# Patient Record
Sex: Male | Born: 1935 | Race: Black or African American | Hispanic: No | Marital: Married | Smoking: Former smoker
Health system: Southern US, Community
[De-identification: ages and names within clinical notes are randomized; demographics above are authoritative.]

## PROBLEM LIST (undated history)

## (undated) DIAGNOSIS — Z5189 Encounter for other specified aftercare: Secondary | ICD-10-CM

## (undated) DIAGNOSIS — M1990 Unspecified osteoarthritis, unspecified site: Secondary | ICD-10-CM

## (undated) DIAGNOSIS — I469 Cardiac arrest, cause unspecified: Secondary | ICD-10-CM

## (undated) DIAGNOSIS — I10 Essential (primary) hypertension: Secondary | ICD-10-CM

## (undated) DIAGNOSIS — K922 Gastrointestinal hemorrhage, unspecified: Secondary | ICD-10-CM

## (undated) DIAGNOSIS — I2510 Atherosclerotic heart disease of native coronary artery without angina pectoris: Secondary | ICD-10-CM

## (undated) DIAGNOSIS — I509 Heart failure, unspecified: Secondary | ICD-10-CM

## (undated) DIAGNOSIS — I472 Ventricular tachycardia: Secondary | ICD-10-CM

## (undated) DIAGNOSIS — E7800 Pure hypercholesterolemia, unspecified: Secondary | ICD-10-CM

## (undated) DIAGNOSIS — R0902 Hypoxemia: Secondary | ICD-10-CM

## (undated) DIAGNOSIS — H269 Unspecified cataract: Secondary | ICD-10-CM

## (undated) DIAGNOSIS — H409 Unspecified glaucoma: Secondary | ICD-10-CM

## (undated) DIAGNOSIS — I219 Acute myocardial infarction, unspecified: Secondary | ICD-10-CM

## (undated) DIAGNOSIS — R011 Cardiac murmur, unspecified: Secondary | ICD-10-CM

## (undated) HISTORY — DX: Heart failure, unspecified: I50.9

## (undated) HISTORY — DX: Encounter for other specified aftercare: Z51.89

## (undated) HISTORY — DX: Hypoxemia: R09.02

## (undated) HISTORY — PX: CARDIAC CATHETERIZATION: SHX172

## (undated) HISTORY — DX: Unspecified osteoarthritis, unspecified site: M19.90

## (undated) HISTORY — DX: Unspecified cataract: H26.9

## (undated) HISTORY — DX: Unspecified glaucoma: H40.9

## (undated) HISTORY — DX: Cardiac murmur, unspecified: R01.1

---

## 2014-08-23 LAB — HM COLONOSCOPY: HM COLON: NORMAL

## 2015-12-13 ENCOUNTER — Inpatient Hospital Stay (HOSPITAL_COMMUNITY)
Admission: EM | Admit: 2015-12-13 | Discharge: 2015-12-20 | DRG: 222 | Disposition: A | Discharge status: Home Health | Payer: Medicare Other | Attending: Internal Medicine | Admitting: Internal Medicine

## 2015-12-13 ENCOUNTER — Emergency Department (HOSPITAL_COMMUNITY): Payer: Medicare Other

## 2015-12-13 ENCOUNTER — Encounter (HOSPITAL_COMMUNITY): Payer: Self-pay | Admitting: *Deleted

## 2015-12-13 DIAGNOSIS — Z951 Presence of aortocoronary bypass graft: Secondary | ICD-10-CM

## 2015-12-13 DIAGNOSIS — I110 Hypertensive heart disease with heart failure: Principal | ICD-10-CM | POA: Diagnosis present

## 2015-12-13 DIAGNOSIS — I080 Rheumatic disorders of both mitral and aortic valves: Secondary | ICD-10-CM | POA: Diagnosis present

## 2015-12-13 DIAGNOSIS — K922 Gastrointestinal hemorrhage, unspecified: Secondary | ICD-10-CM | POA: Diagnosis present

## 2015-12-13 DIAGNOSIS — I25718 Atherosclerosis of autologous vein coronary artery bypass graft(s) with other forms of angina pectoris: Secondary | ICD-10-CM | POA: Diagnosis not present

## 2015-12-13 DIAGNOSIS — Z79899 Other long term (current) drug therapy: Secondary | ICD-10-CM

## 2015-12-13 DIAGNOSIS — Z87891 Personal history of nicotine dependence: Secondary | ICD-10-CM | POA: Diagnosis not present

## 2015-12-13 DIAGNOSIS — R0902 Hypoxemia: Secondary | ICD-10-CM | POA: Diagnosis present

## 2015-12-13 DIAGNOSIS — R7989 Other specified abnormal findings of blood chemistry: Secondary | ICD-10-CM | POA: Diagnosis present

## 2015-12-13 DIAGNOSIS — I10 Essential (primary) hypertension: Secondary | ICD-10-CM | POA: Diagnosis not present

## 2015-12-13 DIAGNOSIS — I252 Old myocardial infarction: Secondary | ICD-10-CM | POA: Diagnosis not present

## 2015-12-13 DIAGNOSIS — I5041 Acute combined systolic (congestive) and diastolic (congestive) heart failure: Secondary | ICD-10-CM | POA: Diagnosis not present

## 2015-12-13 DIAGNOSIS — I472 Ventricular tachycardia, unspecified: Secondary | ICD-10-CM

## 2015-12-13 DIAGNOSIS — I2582 Chronic total occlusion of coronary artery: Secondary | ICD-10-CM | POA: Diagnosis present

## 2015-12-13 DIAGNOSIS — Z7982 Long term (current) use of aspirin: Secondary | ICD-10-CM

## 2015-12-13 DIAGNOSIS — E440 Moderate protein-calorie malnutrition: Secondary | ICD-10-CM | POA: Diagnosis present

## 2015-12-13 DIAGNOSIS — E785 Hyperlipidemia, unspecified: Secondary | ICD-10-CM | POA: Diagnosis present

## 2015-12-13 DIAGNOSIS — I509 Heart failure, unspecified: Secondary | ICD-10-CM | POA: Diagnosis present

## 2015-12-13 DIAGNOSIS — I5023 Acute on chronic systolic (congestive) heart failure: Secondary | ICD-10-CM | POA: Diagnosis not present

## 2015-12-13 DIAGNOSIS — I5033 Acute on chronic diastolic (congestive) heart failure: Secondary | ICD-10-CM | POA: Diagnosis not present

## 2015-12-13 DIAGNOSIS — I5043 Acute on chronic combined systolic (congestive) and diastolic (congestive) heart failure: Secondary | ICD-10-CM | POA: Diagnosis present

## 2015-12-13 DIAGNOSIS — R079 Chest pain, unspecified: Secondary | ICD-10-CM | POA: Insufficient documentation

## 2015-12-13 DIAGNOSIS — E7800 Pure hypercholesterolemia, unspecified: Secondary | ICD-10-CM | POA: Diagnosis present

## 2015-12-13 DIAGNOSIS — Z7902 Long term (current) use of antithrombotics/antiplatelets: Secondary | ICD-10-CM

## 2015-12-13 DIAGNOSIS — I25119 Atherosclerotic heart disease of native coronary artery with unspecified angina pectoris: Secondary | ICD-10-CM | POA: Diagnosis present

## 2015-12-13 DIAGNOSIS — Z8249 Family history of ischemic heart disease and other diseases of the circulatory system: Secondary | ICD-10-CM | POA: Diagnosis not present

## 2015-12-13 DIAGNOSIS — I2510 Atherosclerotic heart disease of native coronary artery without angina pectoris: Secondary | ICD-10-CM | POA: Diagnosis not present

## 2015-12-13 DIAGNOSIS — I350 Nonrheumatic aortic (valve) stenosis: Secondary | ICD-10-CM

## 2015-12-13 DIAGNOSIS — Z95818 Presence of other cardiac implants and grafts: Secondary | ICD-10-CM

## 2015-12-13 DIAGNOSIS — I25118 Atherosclerotic heart disease of native coronary artery with other forms of angina pectoris: Secondary | ICD-10-CM | POA: Diagnosis not present

## 2015-12-13 DIAGNOSIS — I255 Ischemic cardiomyopathy: Secondary | ICD-10-CM | POA: Diagnosis present

## 2015-12-13 DIAGNOSIS — I469 Cardiac arrest, cause unspecified: Secondary | ICD-10-CM | POA: Diagnosis not present

## 2015-12-13 DIAGNOSIS — I25810 Atherosclerosis of coronary artery bypass graft(s) without angina pectoris: Secondary | ICD-10-CM | POA: Diagnosis present

## 2015-12-13 DIAGNOSIS — R778 Other specified abnormalities of plasma proteins: Secondary | ICD-10-CM

## 2015-12-13 DIAGNOSIS — I25710 Atherosclerosis of autologous vein coronary artery bypass graft(s) with unstable angina pectoris: Secondary | ICD-10-CM | POA: Diagnosis not present

## 2015-12-13 HISTORY — DX: Pure hypercholesterolemia, unspecified: E78.00

## 2015-12-13 HISTORY — DX: Atherosclerotic heart disease of native coronary artery without angina pectoris: I25.10

## 2015-12-13 HISTORY — DX: Acute myocardial infarction, unspecified: I21.9

## 2015-12-13 HISTORY — DX: Gastrointestinal hemorrhage, unspecified: K92.2

## 2015-12-13 HISTORY — DX: Cardiac arrest, cause unspecified: I46.9

## 2015-12-13 HISTORY — DX: Essential (primary) hypertension: I10

## 2015-12-13 HISTORY — DX: Ventricular tachycardia: I47.2

## 2015-12-13 LAB — PROTIME-INR
INR: 1.16 (ref 0.00–1.49)
Prothrombin Time: 14.9 seconds (ref 11.6–15.2)

## 2015-12-13 LAB — COMPREHENSIVE METABOLIC PANEL
ALT: 22 U/L (ref 17–63)
AST: 19 U/L (ref 15–41)
Albumin: 2.9 g/dL — ABNORMAL LOW (ref 3.5–5.0)
Alkaline Phosphatase: 54 U/L (ref 38–126)
Anion gap: 12 (ref 5–15)
BUN: 11 mg/dL (ref 6–20)
CHLORIDE: 107 mmol/L (ref 101–111)
CO2: 22 mmol/L (ref 22–32)
CREATININE: 1.16 mg/dL (ref 0.61–1.24)
Calcium: 9.1 mg/dL (ref 8.9–10.3)
GFR, EST NON AFRICAN AMERICAN: 59 mL/min — AB (ref >60)
Glucose, Bld: 104 mg/dL — ABNORMAL HIGH (ref 65–99)
Potassium: 3.9 mmol/L (ref 3.5–5.1)
Sodium: 141 mmol/L (ref 135–145)
Total Bilirubin: 0.9 mg/dL (ref 0.3–1.2)
Total Protein: 6.5 g/dL (ref 6.5–8.1)

## 2015-12-13 LAB — CBC WITH DIFFERENTIAL/PLATELET
Basophils Absolute: 0 10*3/uL (ref 0.0–0.1)
Basophils Relative: 0 %
EOS PCT: 1 %
Eosinophils Absolute: 0.1 10*3/uL (ref 0.0–0.7)
HCT: 36.5 % — ABNORMAL LOW (ref 39.0–52.0)
Hemoglobin: 11.7 g/dL — ABNORMAL LOW (ref 13.0–17.0)
LYMPHS ABS: 1.5 10*3/uL (ref 0.7–4.0)
LYMPHS PCT: 13 %
MCH: 27.6 pg (ref 26.0–34.0)
MCHC: 32.1 g/dL (ref 30.0–36.0)
MCV: 86.1 fL (ref 78.0–100.0)
MONO ABS: 0.9 10*3/uL (ref 0.1–1.0)
MONOS PCT: 8 %
Neutro Abs: 8.8 10*3/uL — ABNORMAL HIGH (ref 1.7–7.7)
Neutrophils Relative %: 78 %
PLATELETS: 279 10*3/uL (ref 150–400)
RBC: 4.24 MIL/uL (ref 4.22–5.81)
RDW: 14.6 % (ref 11.5–15.5)
WBC: 11.3 10*3/uL — ABNORMAL HIGH (ref 4.0–10.5)

## 2015-12-13 LAB — TROPONIN I
TROPONIN I: 0.1 ng/mL — AB (ref <0.031)
Troponin I: 0.11 ng/mL — ABNORMAL HIGH (ref <0.031)

## 2015-12-13 LAB — APTT: aPTT: 67 seconds — ABNORMAL HIGH (ref 24–37)

## 2015-12-13 LAB — I-STAT CG4 LACTIC ACID, ED: LACTIC ACID, VENOUS: 1.5 mmol/L (ref 0.5–2.0)

## 2015-12-13 LAB — BRAIN NATRIURETIC PEPTIDE: B Natriuretic Peptide: 1431.1 pg/mL — ABNORMAL HIGH (ref 0.0–100.0)

## 2015-12-13 MED ORDER — ACETAMINOPHEN 325 MG PO TABS
650.0000 mg | ORAL_TABLET | ORAL | Status: DC | PRN
  Administered 2015-12-16: 650 mg via ORAL
  Filled 2015-12-13: qty 2

## 2015-12-13 MED ORDER — CARVEDILOL 3.125 MG PO TABS
3.1250 mg | ORAL_TABLET | Freq: Two times a day (BID) | ORAL | Status: DC
  Administered 2015-12-14 – 2015-12-19 (×10): 3.125 mg via ORAL
  Filled 2015-12-13 (×10): qty 1

## 2015-12-13 MED ORDER — ONDANSETRON HCL 4 MG/2ML IJ SOLN: 4.0000 mg | Freq: Four times a day (QID) | INTRAMUSCULAR | Status: DC | PRN

## 2015-12-13 MED ORDER — HEPARIN (PORCINE) IN NACL 100-0.45 UNIT/ML-% IJ SOLN
1150.0000 [IU]/h | INTRAMUSCULAR | Status: DC
  Administered 2015-12-13: 900 [IU]/h via INTRAVENOUS
  Filled 2015-12-13: qty 250

## 2015-12-13 MED ORDER — FUROSEMIDE 10 MG/ML IJ SOLN
40.0000 mg | Freq: Two times a day (BID) | INTRAMUSCULAR | Status: DC
  Administered 2015-12-14 – 2015-12-15 (×3): 40 mg via INTRAVENOUS
  Filled 2015-12-13 (×3): qty 4

## 2015-12-13 MED ORDER — CLOPIDOGREL BISULFATE 75 MG PO TABS
75.0000 mg | ORAL_TABLET | Freq: Every day | ORAL | Status: DC
  Administered 2015-12-14 – 2015-12-20 (×7): 75 mg via ORAL
  Filled 2015-12-13 (×8): qty 1

## 2015-12-13 MED ORDER — VITAMIN C 500 MG PO TABS
500.0000 mg | ORAL_TABLET | Freq: Two times a day (BID) | ORAL | Status: DC
  Administered 2015-12-13 – 2015-12-20 (×14): 500 mg via ORAL
  Filled 2015-12-13 (×16): qty 1

## 2015-12-13 MED ORDER — FUROSEMIDE 10 MG/ML IJ SOLN
40.0000 mg | Freq: Once | INTRAMUSCULAR | Status: AC
  Administered 2015-12-13: 40 mg via INTRAVENOUS
  Filled 2015-12-13: qty 4

## 2015-12-13 MED ORDER — ZOLPIDEM TARTRATE 5 MG PO TABS
5.0000 mg | ORAL_TABLET | Freq: Every evening | ORAL | Status: DC | PRN
  Administered 2015-12-14 – 2015-12-17 (×5): 5 mg via ORAL
  Filled 2015-12-13 (×5): qty 1

## 2015-12-13 MED ORDER — FERROUS SULFATE 325 (65 FE) MG PO TABS
325.0000 mg | ORAL_TABLET | Freq: Three times a day (TID) | ORAL | Status: DC
  Administered 2015-12-14 – 2015-12-20 (×19): 325 mg via ORAL
  Filled 2015-12-13 (×20): qty 1

## 2015-12-13 MED ORDER — LINACLOTIDE 145 MCG PO CAPS
145.0000 ug | ORAL_CAPSULE | Freq: Every day | ORAL | Status: DC | PRN
  Administered 2015-12-15: 145 ug via ORAL
  Filled 2015-12-13 (×2): qty 1

## 2015-12-13 MED ORDER — TIMOLOL MALEATE 0.5 % OP SOLN
1.0000 [drp] | Freq: Two times a day (BID) | OPHTHALMIC | Status: DC
  Administered 2015-12-14 – 2015-12-20 (×13): 1 [drp] via OPHTHALMIC
  Filled 2015-12-13 (×2): qty 5

## 2015-12-13 MED ORDER — NITROGLYCERIN 0.4 MG SL SUBL
0.4000 mg | SUBLINGUAL_TABLET | SUBLINGUAL | Status: DC | PRN
Start: 2015-12-13 — End: 2015-12-20

## 2015-12-13 MED ORDER — SODIUM CHLORIDE 0.9% FLUSH
3.0000 mL | Freq: Two times a day (BID) | INTRAVENOUS | Status: DC
  Administered 2015-12-13: 3 mL via INTRAVENOUS

## 2015-12-13 MED ORDER — ASPIRIN 81 MG PO CHEW
324.0000 mg | CHEWABLE_TABLET | Freq: Once | ORAL | Status: DC
  Filled 2015-12-13: qty 4

## 2015-12-13 MED ORDER — HEPARIN BOLUS VIA INFUSION
3000.0000 [IU] | Freq: Once | INTRAVENOUS | Status: AC
  Administered 2015-12-13: 3000 [IU] via INTRAVENOUS
  Filled 2015-12-13: qty 3000

## 2015-12-13 MED ORDER — ASPIRIN EC 325 MG PO TBEC
325.0000 mg | DELAYED_RELEASE_TABLET | Freq: Every day | ORAL | Status: DC
  Administered 2015-12-14 – 2015-12-17 (×4): 325 mg via ORAL
  Filled 2015-12-13 (×4): qty 1

## 2015-12-13 MED ORDER — SODIUM CHLORIDE 0.9 % IV SOLN: 250.0000 mL | INTRAVENOUS | Status: DC | PRN

## 2015-12-13 MED ORDER — FOLIC ACID 1 MG PO TABS
1.0000 mg | ORAL_TABLET | Freq: Every day | ORAL | Status: DC
  Administered 2015-12-14 – 2015-12-20 (×7): 1 mg via ORAL
  Filled 2015-12-13 (×9): qty 1

## 2015-12-13 MED ORDER — ATORVASTATIN CALCIUM 40 MG PO TABS
40.0000 mg | ORAL_TABLET | Freq: Every day | ORAL | Status: DC
  Administered 2015-12-14 – 2015-12-20 (×7): 40 mg via ORAL
  Filled 2015-12-13 (×9): qty 1

## 2015-12-13 MED ORDER — SODIUM CHLORIDE 0.9% FLUSH: 3.0000 mL | INTRAVENOUS | Status: DC | PRN

## 2015-12-13 MED ORDER — RANOLAZINE ER 500 MG PO TB12
500.0000 mg | ORAL_TABLET | Freq: Two times a day (BID) | ORAL | Status: DC
  Administered 2015-12-13 – 2015-12-20 (×14): 500 mg via ORAL
  Filled 2015-12-13 (×15): qty 1

## 2015-12-13 MED ORDER — LATANOPROST 0.005 % OP SOLN
1.0000 [drp] | Freq: Every day | OPHTHALMIC | Status: DC
  Administered 2015-12-13 – 2015-12-19 (×6): 1 [drp] via OPHTHALMIC
  Filled 2015-12-13 (×3): qty 2.5

## 2015-12-13 MED ORDER — EZETIMIBE 10 MG PO TABS
10.0000 mg | ORAL_TABLET | Freq: Every day | ORAL | Status: DC
  Administered 2015-12-14 – 2015-12-20 (×7): 10 mg via ORAL
  Filled 2015-12-13 (×8): qty 1

## 2015-12-13 MED ORDER — AMLODIPINE BESYLATE 10 MG PO TABS
10.0000 mg | ORAL_TABLET | Freq: Every day | ORAL | Status: DC
  Administered 2015-12-14: 10 mg via ORAL
  Filled 2015-12-13 (×2): qty 1

## 2015-12-13 MED ORDER — LISINOPRIL 2.5 MG PO TABS
2.5000 mg | ORAL_TABLET | Freq: Every day | ORAL | Status: DC
  Administered 2015-12-14 – 2015-12-20 (×6): 2.5 mg via ORAL
  Filled 2015-12-13 (×6): qty 1

## 2015-12-13 MED ORDER — MORPHINE SULFATE (PF) 2 MG/ML IV SOLN: 2.0000 mg | INTRAVENOUS | Status: DC | PRN

## 2015-12-13 NOTE — ED Notes (Signed)
Pt here via [facility] for sob that worsening since he woke up.  Recently tx in March for pnx, GI bleed and MI.  EKG sinus with pvcs. O2 sats in mid 90's rr 18.

## 2015-12-13 NOTE — ED Provider Notes (Signed)
CSN: [id number]     Arrival date & time 12/13/15  1507 History   First MD Initiated Contact with Patient 12/13/15 1508     Chief Complaint  Patient presents with  . Shortness of Breath     (Consider location/radiation/quality/duration/timing/severity/associated sxs/prior Treatment) Patient is a 80 y.o. male presenting with shortness of breath. The history is provided by the patient.  Shortness of Breath Severity:  Moderate Onset quality:  Gradual Duration:  1 day Timing:  Constant Progression:  Worsening Chronicity:  New Relieved by:  Nothing Worsened by:  Nothing tried Ineffective treatments:  None tried Associated symptoms: cough (scant)   Associated symptoms: no chest pain, no fever and no vomiting     No past medical history on file. No past surgical history on file. No family history on file. Social History  Substance Use Topics  . Smoking status: Not on file  . Smokeless tobacco: Not on file  . Alcohol Use: Not on file    Review of Systems  Constitutional: Negative for fever.  Respiratory: Positive for cough (scant) and shortness of breath.   Cardiovascular: Negative for chest pain.  Gastrointestinal: Negative for vomiting.      Allergies  Review of patient's allergies indicates not on file.  Home Medications   Prior to Admission medications   Not on File   There were no vitals taken for this visit. Physical Exam  Constitutional: He is oriented to person, place, and time. He appears well-developed and well-nourished. No distress.  HENT:  Head: Normocephalic and atraumatic.  Eyes: Conjunctivae are normal.  Neck: Neck supple. No tracheal deviation present.  Cardiovascular: Normal rate and regular rhythm.   Pulmonary/Chest: Effort normal. No respiratory distress. He has wheezes (faint end-expiratory). He has rales (bibasilar).  Abdominal: Soft. He exhibits no distension.  Neurological: He is alert and oriented to person, place, and time.  Skin: Skin  is warm and dry.  Psychiatric: He has a normal mood and affect.    ED Course  Procedures (including critical care time) Labs Review Labs Reviewed  CBC WITH DIFFERENTIAL/PLATELET - Abnormal; Notable for the following:    WBC 11.3 (*)    Hemoglobin 11.7 (*)    HCT 36.5 (*)    Neutro Abs 8.8 (*)    All other components within normal limits  COMPREHENSIVE METABOLIC PANEL - Abnormal; Notable for the following:    Glucose, Bld 104 (*)    Albumin 2.9 (*)    GFR calc non Af Amer 59 (*)    All other components within normal limits  TROPONIN I - Abnormal; Notable for the following:    Troponin I 0.10 (*)    All other components within normal limits  BRAIN NATRIURETIC PEPTIDE - Abnormal; Notable for the following:    B Natriuretic Peptide 1431.1 (*)    All other components within normal limits  I-STAT CG4 LACTIC ACID, ED    Imaging Review Dg Chest 2 View  12/13/2015  CLINICAL DATA:  Worsening shortness breath and hypoxia. Recent pneumonia, GI bleeding, myocardial infarction. EXAM: CHEST  2 VIEW COMPARISON:  None. FINDINGS: The heart size and mediastinal contours are within normal limits. Pulmonary hyperinflation and coarsening of interstitial lung markings is consistent with COPD. No evidence of pulmonary consolidation or frank pulmonary edema. No evidence of pneumothorax or pleural effusion. Prior CABG noted. IMPRESSION: COPD.  No acute findings. Electronically Signed   By: Myles Rosenthal M.D.   On: 12/13/2015 16:12   I have personally reviewed and  evaluated these images and lab results as part of my medical decision-making.   EKG Interpretation   Date/Time:  Saturday December 13 2015 15:19:33 EDT Ventricular Rate:  102 PR Interval:  143 QRS Duration: 89 QT Interval:  338 QTC Calculation: 440 R Axis:   73 Text Interpretation:  Sinus tachycardia Ventricular premature complex  Probable left atrial enlargement ST-t abnormality Inferolateral leads No  previous tracing Reconfirmed by Ashlynne Shetterly  MD, Reuel BoomANIEL ([id number]) on 12/13/2015  3:26:24 PM      MDM   Final diagnoses:  Troponin level elevated  Acute on chronic congestive heart failure, unspecified congestive heart failure type (HCC)  Hypoxemia    80 y.o. male presents with shortness of breath starting this morning. He is from [place] and is out of town on his way to see his grandson play college football. He stated tonight and town last night but woke up today and was dyspneic. He was found to be hypoxemic to the high 80s with EMS. The patient states that he was admitted previously to the hospital in [place] with a heart attack and pneumonia and possibly GI bleed. He has had some foot swelling for the few days prior to arrival. No records are available in the emergency Department but were requested from his admitting facility. I do not have a baseline EKG for comparison but he does have some abnormal T-wave findings concerning for possible ischemia if they represent new changes. His troponin is positive at 0.1. His BNP is markedly elevated at 1400. He is on 40 mg of by mouth Lasix daily and at this time I suspect he is likely in a state of CHF exacerbation given his history. He is requiring 2 L of oxygen will need admission to the hospital for diuresis, troponin cycling and further monitoring. Heparinized with high risk of ACS but no active chest pain.   Discussed with cardiology who agree with medical admission for diuresis. Hospitalist was consulted for admission and will see the patient in the emergency department.    Lyndal Pulleyaniel Eithan Beagle, MD 12/14/15 [phone]

## 2015-12-13 NOTE — Progress Notes (Signed)
ANTICOAGULATION CONSULT NOTE - Initial Consult  Pharmacy Consult for heparin Indication: chest pain/ACS  No Known Allergies  Patient Measurements: Height: 5' 9.5" (176.5 cm) Weight: 152 lb (68.947 kg) IBW/kg (Calculated) : 71.85 Heparin Dosing Weight: 68kg  Vital Signs: Temp: 99 F (37.2 C) (04/22 1700) Temp Source: Rectal (04/22 1700) BP: 139/78 mmHg (04/22 1700) Pulse Rate: 106 (04/22 1700)  Labs:  Recent Labs  12/13/15 1552  HGB 11.7*  HCT 36.5*  PLT 279  CREATININE 1.16  TROPONINI 0.10*    Estimated Creatinine Clearance: 52 mL/min (by C-G formula based on Cr of 1.16).   Assessment: [age] YOM here with SOB- worsening throughout the day. Recently here with GIB and MI in March.  Patient is on ASA and Plavix PTA, but no anticoagulation.  Baseline Hgb 11.7, plts 279- no bleeding noted at this time.  Goal of Therapy:  Heparin level 0.3-0.7 units/ml Monitor platelets by anticoagulation protocol: Yes   Plan:  -heparin bolus with 3000 units IV x1, then start infusion at 900 units/hr -heparin level in 8h -daily HL and CBC -follow cardiac work-up  Bani Gianfrancesco D. Lavender Stanke, PharmD, BCPS Clinical Pharmacist Pager: [phone] 12/13/2015 5:33 PM

## 2015-12-13 NOTE — ED Notes (Signed)
Pt transported to xray 

## 2015-12-13 NOTE — ED Notes (Signed)
Bag lunch taken to patient per order.  Per Andre Wilson Dr. Clyde LundborgNiu told him not to eat for now.  Verbalizes understanding.

## 2015-12-13 NOTE — ED Notes (Signed)
Attempted report.  Nurse to call back when available. 

## 2015-12-14 ENCOUNTER — Encounter (HOSPITAL_COMMUNITY): Payer: Self-pay | Admitting: Internal Medicine

## 2015-12-14 DIAGNOSIS — I2510 Atherosclerotic heart disease of native coronary artery without angina pectoris: Secondary | ICD-10-CM

## 2015-12-14 LAB — CBC
HCT: 34 % — ABNORMAL LOW (ref 39.0–52.0)
HEMOGLOBIN: 11 g/dL — AB (ref 13.0–17.0)
MCH: 27.9 pg (ref 26.0–34.0)
MCHC: 32.4 g/dL (ref 30.0–36.0)
MCV: 86.3 fL (ref 78.0–100.0)
Platelets: 272 10*3/uL (ref 150–400)
RBC: 3.94 MIL/uL — ABNORMAL LOW (ref 4.22–5.81)
RDW: 14.6 % (ref 11.5–15.5)
WBC: 8.7 10*3/uL (ref 4.0–10.5)

## 2015-12-14 LAB — BASIC METABOLIC PANEL WITH GFR
Anion gap: 13 (ref 5–15)
BUN: 11 mg/dL (ref 6–20)
CO2: 22 mmol/L (ref 22–32)
Calcium: 8.8 mg/dL — ABNORMAL LOW (ref 8.9–10.3)
Chloride: 106 mmol/L (ref 101–111)
Creatinine, Ser: 1.15 mg/dL (ref 0.61–1.24)
GFR calc Af Amer: >60 mL/min
GFR calc non Af Amer: 60 mL/min — ABNORMAL LOW
Glucose, Bld: 82 mg/dL (ref 65–99)
Potassium: 3.1 mmol/L — ABNORMAL LOW (ref 3.5–5.1)
Sodium: 141 mmol/L (ref 135–145)

## 2015-12-14 LAB — HEPARIN LEVEL (UNFRACTIONATED): HEPARIN UNFRACTIONATED: 0.12 [IU]/mL — AB (ref 0.30–0.70)

## 2015-12-14 LAB — LIPID PANEL
Cholesterol: 159 mg/dL (ref 0–200)
HDL: 28 mg/dL — ABNORMAL LOW
LDL Cholesterol: 120 mg/dL — ABNORMAL HIGH (ref 0–99)
Total CHOL/HDL Ratio: 5.7 ratio
Triglycerides: 56 mg/dL
VLDL: 11 mg/dL (ref 0–40)

## 2015-12-14 LAB — TROPONIN I
TROPONIN I: 0.09 ng/mL — AB (ref <0.031)
Troponin I: 0.09 ng/mL — ABNORMAL HIGH (ref <0.031)

## 2015-12-14 MED ORDER — POTASSIUM CHLORIDE CRYS ER 20 MEQ PO TBCR
40.0000 meq | EXTENDED_RELEASE_TABLET | ORAL | Status: AC
  Administered 2015-12-14 (×2): 40 meq via ORAL
  Filled 2015-12-14 (×2): qty 2

## 2015-12-14 MED ORDER — HEPARIN SODIUM (PORCINE) 5000 UNIT/ML IJ SOLN
5000.0000 [IU] | Freq: Three times a day (TID) | INTRAMUSCULAR | Status: DC
  Administered 2015-12-14 – 2015-12-17 (×10): 5000 [IU] via SUBCUTANEOUS
  Filled 2015-12-14 (×10): qty 1

## 2015-12-14 MED ORDER — HEPARIN BOLUS VIA INFUSION
2000.0000 [IU] | Freq: Once | INTRAVENOUS | Status: DC
  Filled 2015-12-14: qty 2000

## 2015-12-14 NOTE — Evaluation (Signed)
Physical Therapy Evaluation Patient Details Name: Andre Wilson MRN: [id number] DOB: [birth year] Today's Date: 12/14/2015   History of Present Illness  Andre Wilson is a 80 y.o. male with medical history significant of hypertension, hyperlipidemia, GI bleeding, CAD, myocardial infarction, S/P of CABG 1987, GI bleeding, who presents with shortness of breath and bilateral ankle edema, CHF exaccerbation  Clinical Impression   Pt admitted with above diagnosis. Pt currently with functional limitations due to the deficits listed below (see PT Problem List).  Pt will benefit from skilled PT to increase their independence and safety with mobility to allow discharge to the venue listed below.    Benefitted from UE suport pushing Dinamap; would like to see him walk without UE support, and go up and down a few stairs as he is independent in the community; Will likely meet goals in just one, maybe 2 more sessions     Follow Up Recommendations No PT follow up    Equipment Recommendations  None recommended by PT    Recommendations for Other Services       Precautions / Restrictions Precautions Precautions: None      Mobility  Bed Mobility Overal bed mobility: Modified Independent             General bed mobility comments: No difficulty getting up  Transfers Overall transfer level: Needs assistance Equipment used: None Transfers: Sit to/from Stand Sit to Stand: Supervision         General transfer comment: Using UEs for steadiness at initial stand  Ambulation/Gait Ambulation/Gait assistance: Supervision Ambulation Distance (Feet): 110 Feet Assistive device: None (pushing Dinamap) Gait Pattern/deviations: Step-through pattern (trunk slightly flexed)     General Gait Details: Cues to self-monitor for acitivity tolerance; O2 sats remained greater than or equal to  97% suring amb on Room Air; Benefitted from UE suport pushing Dinamap; would like to see him walk without UE  support  Stairs            Wheelchair Mobility    Modified Rankin (Stroke Patients Only)       Balance                                             Pertinent Vitals/Pain Pain Assessment: No/denies pain    Home Living Family/patient expects to be discharged to:: Private residence Living Arrangements: Spouse/significant other Available Help at Discharge: Family;Available PRN/intermittently Type of Home: House (eill dc to daughter's home) Home Access: Level entry     Home Layout: Multi-level;Able to live on main level with bedroom/bathroom Home Equipment: None      Prior Function Level of Independence: Independent               Hand Dominance        Extremity/Trunk Assessment   Upper Extremity Assessment: Overall WFL for tasks assessed           Lower Extremity Assessment: Overall WFL for tasks assessed         Communication   Communication: No difficulties  Cognition Arousal/Alertness: Awake/alert Behavior During Therapy: WFL for tasks assessed/performed Overall Cognitive Status: Within Functional Limits for tasks assessed                      General Comments      Exercises        Assessment/Plan    PT Assessment  Patient needs continued PT services  PT Diagnosis Other (comment);Difficulty walking (decr fucntional capacity)   PT Problem List Decreased activity tolerance;Decreased balance;Cardiopulmonary status limiting activity  PT Treatment Interventions DME instruction;Gait training;Stair training;Functional mobility training;Therapeutic activities;Therapeutic exercise;Balance training;Patient/family education   PT Goals (Current goals can be found in the Care Plan section) Acute Rehab PT Goals Patient Stated Goal: Hopes to be back home soon PT Goal Formulation: With patient Time For Goal Achievement: 12/21/15 Potential to Achieve Goals: Good    Frequency Min 3X/week   Barriers to discharge         Co-evaluation               End of Session Equipment Utilized During Treatment:  (O2 sat monitor) Activity Tolerance: Patient tolerated treatment well Patient left: in bed;with call bell/phone within reach Nurse Communication: Mobility status         Time: 1000-1014 PT Time Calculation (min) (ACUTE ONLY): 14 min   Charges:   PT Evaluation $PT Eval Low Complexity: 1 Procedure     PT G CodesOlen Pel:        Bader Stubblefield Hamff 12/14/2015, 10:27 AM  Van ClinesHolly Amalia [place], PT  Acute Rehabilitation Services Pager [phone] Office [phone]

## 2015-12-14 NOTE — Progress Notes (Signed)
ANTICOAGULATION CONSULT NOTE - Follow-up Consult  Pharmacy Consult for heparin Indication: chest pain/ACS  No Known Allergies  Patient Measurements: Height: 5' 9.5" (176.5 cm) Weight: 147 lb 12.8 oz (67.042 kg) (Scale C) IBW/kg (Calculated) : 71.85 Heparin Dosing Weight: 68kg  Vital Signs: Temp: 98.3 F (36.8 C) (04/23 0008) Temp Source: Oral (04/23 0008) BP: 124/68 mmHg (04/23 0008) Pulse Rate: 98 (04/23 0008)  Labs:  Recent Labs  12/13/15 1552 12/13/15 2100 12/14/15 0240  HGB 11.7*  --  11.0*  HCT 36.5*  --  34.0*  PLT 279  --  272  APTT  --  67*  --   LABPROT  --  14.9  --   INR  --  1.16  --   HEPARINUNFRC  --   --  0.12*  CREATININE 1.16  --  1.15  TROPONINI 0.10* 0.11* 0.09*    Estimated Creatinine Clearance: 51 mL/min (by C-G formula based on Cr of 1.15).   Assessment: 79 YOM on heparin for r/o ACS. Trop trending down. Heparin level subtherapeutic on 900 units/hr. CBC stable. No issues with line or bleeding reported per RN.  Goal of Therapy:  Heparin level 0.3-0.7 units/ml Monitor platelets by anticoagulation protocol: Yes   Plan:  Rebolus heparin 2000 units IV Increase heparin infusion to 1150 units/hr Heparin level in 8 hours  Christoper Fabianaron Lazar Tierce, PharmD, BCPS Clinical pharmacist, pager [phone] 12/14/2015 4:41 AM

## 2015-12-14 NOTE — Progress Notes (Signed)
Occupational Therapy Evaluation Patient Details Name: Andre Wilson MRN: [id number] DOB: October 12, 1935 Today's Date: 12/14/2015    History of Present Illness Andre Meagerelson Dombeck is a 80 y.o. male with medical history significant of hypertension, hyperlipidemia, GI bleeding, CAD, myocardial infarction, S/P of CABG 1987, GI bleeding, who presents with shortness of breath and bilateral ankle edema, CHF exaccerbation   Clinical Impression   Pt admitted with the above diagnoses and presents with below problem list. Pt will benefit from continued acute OT to address the below listed deficits and maximize independence with BADLs prior to d/c home. PTA pt was independent with ADLs. Pt is currently mostly min guard with OOB ADLs. 1 LOB while standing and tying his gown in front, min A to correct balance. Pt on RA throughout session. Of note, O2 after in-room ambulation and functional tasks was 89. O2 recovered to 94 seated on RA at end of session. Pt also reporting some lightheadedness during session.      Follow Up Recommendations  No OT follow up    Equipment Recommendations  None recommended by OT    Recommendations for Other Services       Precautions / Restrictions Precautions Precautions: None      Mobility Bed Mobility Overal bed mobility: Modified Independent             General bed mobility comments: in recliner  Transfers Overall transfer level: Needs assistance Equipment used: None Transfers: Sit to/from Stand Sit to Stand: Min guard;Supervision         General transfer comment: mild unsteadiness initially, narrow BOS noted at times. transfers from recliner and comfort height toilet.     Balance Overall balance assessment: Needs assistance Sitting-balance support: No upper extremity supported;Feet supported Sitting balance-Leahy Scale: Good     Standing balance support: No upper extremity supported;During functional activity Standing balance-Leahy Scale:  Fair Standing balance comment: 1 LOB while completing dynamic activity in standing position (tying gown in front), min A to recover balance                            ADL Overall ADL's : Needs assistance/impaired Eating/Feeding: Set up;Sitting   Grooming: Wash/dry hands;Min guard;Standing Grooming Details (indicate cue type and reason): stood to wash hands, mild sway no LOB Upper Body Bathing: Set up;Sitting   Lower Body Bathing: Sit to/from stand;Min guard;Minimal assistance   Upper Body Dressing : Set up;Sitting   Lower Body Dressing: Min guard;Minimal assistance;Sit to/from stand   Toilet Transfer: Min guard;Ambulation;Comfort height toilet Toilet Transfer Details (indicate cue type and reason): grab bars not utilized, mild sway, no LOB, narrow BOS noted Toileting- Clothing Manipulation and Hygiene: Min guard;Sit to/from stand   Tub/ Shower Transfer: Tub transfer;Min guard;Ambulation;3 in 1   Functional mobility during ADLs: Min guard General ADL Comments: Pt completed in-room functional mobility, toilet transfer, and grooming task at sink. 1 LOB in standing while pt tying his hospital robe in the front, min A to recover balance. Narrow BOS noted at times during trasnfer. Discussed DME for toilet and shower, reviewed enery conservation. Educated pt on completing dynamic ADL tasks such as dressing in front of a seat surface. Also discussed positioning chairs strategically throughout house.      Vision     Perception     Praxis      Pertinent Vitals/Pain Pain Assessment: No/denies pain     Hand Dominance     Extremity/Trunk Assessment Upper Extremity Assessment Upper  Extremity Assessment: Overall WFL for tasks assessed   Lower Extremity Assessment Lower Extremity Assessment: Defer to PT evaluation       Communication Communication Communication: No difficulties   Cognition Arousal/Alertness: Awake/alert Behavior During Therapy: WFL for tasks  assessed/performed Overall Cognitive Status: Within Functional Limits for tasks assessed                     General Comments       Exercises       Shoulder Instructions      Home Living Family/patient expects to be discharged to:: Private residence Living Arrangements: Spouse/significant other Available Help at Discharge: Family;Available PRN/intermittently Type of Home: House (eill dc to daughter's home) Home Access: Level entry     Home Layout: Multi-level;Bed/bath upstairs     Bathroom Shower/Tub: Tub/shower unit Shower/tub characteristics: Curtain       Home Equipment: Bedside commode;Grab bars - tub/shower;Shower seat - built in   Additional Comments: Pt from out ot town. Lives in [place]; visiting daughter in [place].      Prior Functioning/Environment Level of Independence: Independent             OT Diagnosis: Generalized weakness   OT Problem List: Decreased activity tolerance;Impaired balance (sitting and/or standing);Decreased knowledge of use of DME or AE;Decreased knowledge of precautions;Cardiopulmonary status limiting activity   OT Treatment/Interventions: Self-care/ADL training;Therapeutic exercise;Energy conservation;DME and/or AE instruction;Therapeutic activities;Patient/family education;Balance training    OT Goals(Current goals can be found in the care plan section) Acute Rehab OT Goals Patient Stated Goal: Hopes to be back home soon OT Goal Formulation: With patient Time For Goal Achievement: 12/21/15 Potential to Achieve Goals: Good ADL Goals Pt Will Perform Grooming: with modified independence;sitting;standing Pt Will Perform Lower Body Bathing: with modified independence;sit to/from stand Pt Will Perform Lower Body Dressing: with modified independence;sit to/from stand Pt Will Transfer to Toilet: with modified independence;ambulating Pt Will Perform Toileting - Clothing Manipulation and hygiene: with modified  independence;sit to/from stand Pt Will Perform Tub/Shower Transfer: Tub transfer;with supervision;ambulating;shower seat  OT Frequency: Min 2X/week   Barriers to D/C: Inaccessible home environment;Decreased caregiver support  lives in [place], bed and bath are upstairs; intermittent assist available at d/c       Co-evaluation              End of Session Nurse Communication: Other (comment) (O2 stats; lightheaded while ambulating)  Activity Tolerance: Patient tolerated treatment well;Other (comment) (Pt reported feeling lightheaded while ambualting. ) Patient left: in chair;with call bell/phone within reach   Time: 1205-1222 OT Time Calculation (min): 17 min Charges:  OT General Charges $OT Visit: 1 Procedure OT Evaluation $OT Eval Low Complexity: 1 Procedure G-Codes:    Pilar Grammes Jan 13, 2016, 12:48 PM

## 2015-12-14 NOTE — Progress Notes (Signed)
PROGRESS NOTE                                                                                                                                                                                                             Patient Demographics:    Andre Wilson, is a 80 y.o. male, DOB - [birth year], ZOX:[id number]  Admit date - 12/13/2015   Admitting Physician Lorretta Harp, MD  Outpatient Primary MD for the patient in [place] LOS - 1  Outpatient Specialists: Cardiologist at [facility] and in [facility] Complaint  Patient presents with  . Shortness of Breath       Brief Narrative -   Andre Wilson is a 80 y.o. male with medical history significant of hypertension, hyperlipidemia, GI bleeding, CAD, myocardial infarction, S/P of CABG 1987, GI bleeding, who presents with shortness of breath and bilateral ankle edema.  Patient reports that he has been having shortness of breath in the past several days, which has been progressively getting worse. He also noticed bilateral ankle fluid retention. He has mild dry cough, but no chest pain, fever or chills. Patient does not have nausea, vomiting, abdominal pain, diarrhea or symptoms of UTI. No acute laterally weakness.  ED Course: pt was found to have BNP 1431, troponin 0.10, lactate 1.50, WBC 11.3, temperature 99, tachycardia, electrolytes and renal function okay. Chest x-ray showed COPD change, but no infiltration. Patient is admitted to inpatient for further reduction treatment.    Subjective:    [name] today has, No headache, No chest pain, No abdominal pain - No Nausea, No new weakness tingling or numbness, No Cough - Improved shortness of breath.   Assessment  & Plan :     1. Acute on chronic diastolic CHF last EF more than one year ago was 55%. Much improved after IV Lasix, continue ACE inhibitor along with Coreg and aspirin, one more day of IV Lasix in stable  discharge tomorrow with follow-up with primary care physician and cardiologist at [facility]. Echogram pending.  2. CAD. Troponin elevated in non-ACS pattern, chest pain-free, EKG nonacute, mildly elevated troponin due to CHF with demand ischemia, completely chest pain-free, continue aspirin, Coreg, statin, city, Plavix and Ranexa for secondary prevention. Outpatient cardiology follow-up.  3. Dyslipidemia. On statin and city up.  4. Moderate PCM. Nutritionist consult, outpatient PCP follow-up for weight  loss for age-appropriate workup.   Code Status : Full  Family Communication  : None present  Disposition Plan  : Home 1-2 days  Barriers For Discharge : CHF treatment  Consults  :     Procedures  :   TTE  DVT Prophylaxis  :  Heparin   Lab Results  Component Value Date   PLT 272 12/14/2015    Antibiotics  :     Anti-infectives    None        Objective:   Filed Vitals:   12/13/15 2025 12/13/15 2044 12/14/15 0008 12/14/15 0553  BP:  122/72 124/68 110/72  Pulse:  101 98 96  Temp: 98.5 F (36.9 C) 98.6 F (37 C) 98.3 F (36.8 C) 97.8 F (36.6 C)  TempSrc: Oral Oral Oral Oral  Resp:  Height:  5' 9.5" (1.765 m)    Weight:  67.042 kg (147 lb 12.8 oz)  65.772 kg (145 lb)  SpO2:  100% 100% 95%    Wt Readings from Last 3 Encounters:  12/14/15 65.772 kg (145 lb)     Intake/Output Summary (Last 24 hours) at 12/14/15 1116 Last data filed at 12/14/15 1020  Gross per 24 hour  Intake    360 ml  Output   1450 ml  Net  -1090 ml     Physical Exam  Awake Alert, Oriented X 3, No new F.N deficits, Normal affect [place].AT,PERRAL Supple Neck,No JVD, No cervical lymphadenopathy appriciated.  Symmetrical Chest wall movement, Good air movement bilaterally, few rales RRR,No Gallops,Rubs or new Murmurs, No Parasternal Heave +ve B.Sounds, Abd Soft, No tenderness, No organomegaly appriciated, No rebound - guarding or rigidity. No Cyanosis, Clubbing or edema, No new  Rash or bruise       Data Review:    CBC  Recent Labs Lab 12/13/15 1552 12/14/15 0240  WBC 11.3* 8.7  HGB 11.7* 11.0*  HCT 36.5* 34.0*  PLT 279 272  MCV 86.1 86.3  MCH 27.6 27.9  MCHC 32.1 32.4  RDW 14.6 14.6  LYMPHSABS 1.5  --   MONOABS 0.9  --   EOSABS 0.1  --   BASOSABS 0.0  --     Chemistries   Recent Labs Lab 12/13/15 1552 12/14/15 0240  NA 141 141  K 3.9 3.1*  CL 107 106  CO2 22 22  GLUCOSE 104* 82  BUN 11 11  CREATININE 1.16 1.15  CALCIUM 9.1 8.8*  AST 19  --   ALT 22  --   ALKPHOS 54  --   BILITOT 0.9  --    ------------------------------------------------------------------------------------------------------------------  Recent Labs  12/14/15 0240  CHOL 159  HDL 28*  LDLCALC 120*  TRIG 56  CHOLHDL 5.7    No results found for: HGBA1C ------------------------------------------------------------------------------------------------------------------ No results for input(s): TSH, T4TOTAL, T3FREE, THYROIDAB in the last 72 hours.  Invalid input(s): FREET3 ------------------------------------------------------------------------------------------------------------------ No results for input(s): VITAMINB12, FOLATE, FERRITIN, TIBC, IRON, RETICCTPCT in the last 72 hours.  Coagulation profile  Recent Labs Lab 12/13/15 2100  INR 1.16    No results for input(s): DDIMER in the last 72 hours.  Cardiac Enzymes  Recent Labs Lab 12/13/15 2100 12/14/15 0240 12/14/15 0842  TROPONINI 0.11* 0.09* 0.09*   ------------------------------------------------------------------------------------------------------------------    Component Value Date/Time   BNP 1431.1* 12/13/2015 1553    Inpatient Medications  Scheduled Meds: . amLODipine  10 mg Oral Daily  . aspirin  324 mg Oral Once  . aspirin  325 mg Oral  Daily  . atorvastatin  40 mg Oral Daily  . carvedilol  3.125 mg Oral BID WC  . clopidogrel  75 mg Oral Daily  . ezetimibe  10 mg Oral  Daily  . ferrous sulfate  325 mg Oral TID WC  . folic acid  1 mg Oral Daily  . furosemide  40 mg Intravenous Q12H  . heparin subcutaneous  5,000 Units Subcutaneous Q8H  . latanoprost  1 drop Both Eyes QHS  . lisinopril  2.5 mg Oral Daily  . potassium chloride  40 mEq Oral Q4H  . ranolazine  500 mg Oral BID  . timolol  1 drop Both Eyes BID  . vitamin C  500 mg Oral BID   Continuous Infusions:  PRN Meds:.acetaminophen, linaclotide, morphine injection, nitroGLYCERIN, ondansetron (ZOFRAN) IV, zolpidem  Micro Results No results found for this or any previous visit (from the past 240 hour(s)).  Radiology Reports Dg Chest 2 View  12/13/2015  CLINICAL DATA:  Worsening shortness breath and hypoxia. Recent pneumonia, GI bleeding, myocardial infarction. EXAM: CHEST  2 VIEW COMPARISON:  None. FINDINGS: The heart size and mediastinal contours are within normal limits. Pulmonary hyperinflation and coarsening of interstitial lung markings is consistent with COPD. No evidence of pulmonary consolidation or frank pulmonary edema. No evidence of pneumothorax or pleural effusion. Prior CABG noted. IMPRESSION: COPD.  No acute findings. Electronically Signed   By: Myles RosenthalJohn  Stahl M.D.   On: 12/13/2015 16:12    Time Spent in minutes  30   Tashiba Timoney K M.D on 12/14/2015 at 11:16 AM  Between 7am to 7pm - Pager - [phone]  After 7pm go to [email or web address] - password [email or web address]  [facility] -  Office  [phone]

## 2015-12-14 NOTE — Progress Notes (Signed)
[facility] Alert and oriented X 4. No complaint of pain or SOB on admission. VSS. SR/ST on telemetry. Oriented to room and call system.

## 2015-12-14 NOTE — Progress Notes (Signed)
Utilization review completed.  

## 2015-12-14 NOTE — H&P (Signed)
History and Physical    Varick Keys ZOX:[id number] DOB: [birth year] DOA: 12/13/2015  Referring MD/NP/PA:   PCP: No primary care provider on file.  Outpatient Specialists: none Patient coming from:  Home    Chief Complaint: Shortness of breath and bilateral ankle edema  HPI: Andre Wilson is a 80 y.o. male with medical history significant of hypertension, hyperlipidemia, GI bleeding, CAD, myocardial infarction, S/P of CABG 1987, GI bleeding, who presents with shortness of breath and bilateral ankle edema.  Patient reports that he has been having shortness of breath in the past several days, which has been progressively getting worse. He also noticed bilateral ankle fluid retention. He has mild dry cough, but no chest pain, fever or chills. Patient does not have nausea, vomiting, abdominal pain, diarrhea or symptoms of UTI. No acute laterally weakness.  ED Course: pt was found to have BNP 1431, troponin 0.10, lactate 1.50, WBC 11.3, temperature 99, tachycardia, electrolytes and renal function okay. Chest x-ray showed COPD change, but no infiltration. Patient is admitted to inpatient for further reduction treatment.  Can patient participate in ADLs?  some  Review of Systems:   General: no fevers, chills, has body weight loss of 25 LBs, has poor appetite, has fatigue HEENT: no blurry vision, hearing changes or sore throat Pulm: has dyspnea, coughing, no wheezing CV: no chest pain, no palpitations Abd: no nausea, vomiting, abdominal pain, diarrhea, constipation GU: no dysuria, burning on urination, increased urinary frequency, hematuria  Ext: has ankle edema Neuro: no unilateral weakness, numbness, or tingling, no vision change or hearing loss Skin: no rash MSK: No muscle spasm, no deformity, no limitation of range of movement in spin Heme: No easy bruising.  Travel history: No recent long distant travel.  Allergy: No Known Allergies  Past Medical History  Diagnosis Date  . MI  (myocardial infarction) (HCC)   . GI bleed   . Hypertension   . Hypercholesteremia   . CAD (coronary artery disease)     Past Surgical History  Procedure Laterality Date  . Cardiac catheterization      CABG 1987    Social History:  reports that he has quit smoking. He does not have any smokeless tobacco history on file. He reports that he does not drink alcohol or use illicit drugs.  Family History:  Family History  Problem Relation Age of Onset  . Hypertension Mother   . Hyperlipidemia Mother   . Heart attack Father      Prior to Admission medications   Medication Sig Start Date End Date Taking? Authorizing Provider  amLODipine (NORVASC) 10 MG tablet Take 10 mg by mouth daily.   Yes Historical Provider, MD  aspirin 325 MG EC tablet Take 325 mg by mouth daily.   Yes Historical Provider, MD  atorvastatin (LIPITOR) 40 MG tablet Take 40 mg by mouth daily.   Yes Historical Provider, MD  carvedilol (COREG) 3.125 MG tablet Take 3.125 mg by mouth 2 (two) times daily with a meal.   Yes Historical Provider, MD  clopidogrel (PLAVIX) 75 MG tablet Take 75 mg by mouth daily.   Yes Historical Provider, MD  ezetimibe (ZETIA) 10 MG tablet Take 10 mg by mouth daily.   Yes Historical Provider, MD  ferrous sulfate 325 (65 FE) MG EC tablet Take 325 mg by mouth 3 (three) times daily with meals.   Yes Historical Provider, MD  folic acid (FOLVITE) 1 MG tablet Take 1 mg by mouth daily.   Yes Historical Provider, MD  furosemide (LASIX) 40 MG tablet Take 40 mg by mouth daily.   Yes Historical Provider, MD  latanoprost (XALATAN) 0.005 % ophthalmic solution Place 1 drop into both eyes at bedtime. 12/02/15  Yes Historical Provider, MD  linaclotide (LINZESS) 145 MCG CAPS capsule Take 145 mcg by mouth daily as needed (for constipation).   Yes Historical Provider, MD  nitroGLYCERIN (NITROSTAT) 0.4 MG SL tablet Place 0.4 mg under the tongue every 5 (five) minutes as needed for chest pain.   Yes Historical  Provider, MD  ranolazine (RANEXA) 500 MG 12 hr tablet Take 500 mg by mouth 2 (two) times daily.   Yes Historical Provider, MD  timolol (TIMOPTIC) 0.5 % ophthalmic solution Place 1 drop into both eyes 2 (two) times daily. 12/02/15  Yes Historical Provider, MD  vitamin C (ASCORBIC ACID) 500 MG tablet Take 500 mg by mouth 2 (two) times daily.   Yes Historical Provider, MD    Physical Exam: Filed Vitals:   12/13/15 2025 12/13/15 2044 12/14/15 0008 12/14/15 0553  BP:  122/72 124/68 110/72  Pulse:  101 98 96  Temp: 98.5 F (36.9 C) 98.6 F (37 C) 98.3 F (36.8 C) 97.8 F (36.6 C)  TempSrc: Oral Oral Oral Oral  Resp:  Height:  5' 9.5" (1.765 m)    Weight:  67.042 kg (147 lb 12.8 oz)  65.772 kg (145 lb)  SpO2:  100% 100% 95%   General: Not in acute distress HEENT:       Eyes: PERRL, EOMI, no scleral icterus.       ENT: No discharge from the ears and nose, no pharynx injection, no tonsillar enlargement.        Neck: postive JVD, no bruit, no mass felt. Heme: No neck lymph node enlargement. Cardiac: S1/S2, RRR, No murmurs, No gallops or rubs. Pulm: No rales, wheezing, rhonchi or rubs. Abd: Soft, nondistended, nontender, no rebound pain, no organomegaly, BS present. GU: No hematuria Ext: 1+ ankle edema bilaterally. 2+DP/PT pulse bilaterally. Musculoskeletal: No joint deformities, No joint redness or warmth, no limitation of ROM in spin. Skin: No rashes.  Neuro: Alert, oriented X3, cranial nerves II-XII grossly intact, moves all extremities normally.  Psych: Patient is not psychotic, no suicidal or hemocidal ideation.  Labs on Admission: I have personally reviewed following labs and imaging studies  CBC:  Recent Labs Lab 12/13/15 1552 12/14/15 0240  WBC 11.3* 8.7  NEUTROABS 8.8*  --   HGB 11.7* 11.0*  HCT 36.5* 34.0*  MCV 86.1 86.3  PLT 279 272   Basic Metabolic Panel:  Recent Labs Lab 12/13/15 1552 12/14/15 0240  NA 141 141  K 3.9 3.1*  CL 107 106  CO2 22  22  GLUCOSE 104* 82  BUN 11 11  CREATININE 1.16 1.15  CALCIUM 9.1 8.8*   GFR: Estimated Creatinine Clearance: 50.1 mL/min (by C-G formula based on Cr of 1.15). Liver Function Tests:  Recent Labs Lab 12/13/15 1552  AST 19  ALT 22  ALKPHOS 54  BILITOT 0.9  PROT 6.5  ALBUMIN 2.9*   No results for input(s): LIPASE, AMYLASE in the last 168 hours. No results for input(s): AMMONIA in the last 168 hours. Coagulation Profile:  Recent Labs Lab 12/13/15 2100  INR 1.16   Cardiac Enzymes:  Recent Labs Lab 12/13/15 1552 12/13/15 2100 12/14/15 0240  TROPONINI 0.10* 0.11* 0.09*   BNP (last 3 results) No results for input(s): PROBNP in the last 8760 hours. HbA1C: No results for  input(s): HGBA1C in the last 72 hours. CBG: No results for input(s): GLUCAP in the last 168 hours. Lipid Profile:  Recent Labs  12/14/15 0240  CHOL 159  HDL 28*  LDLCALC 120*  TRIG 56  CHOLHDL 5.7   Thyroid Function Tests: No results for input(s): TSH, T4TOTAL, FREET4, T3FREE, THYROIDAB in the last 72 hours. Anemia Panel: No results for input(s): VITAMINB12, FOLATE, FERRITIN, TIBC, IRON, RETICCTPCT in the last 72 hours. Urine analysis: No results found for: COLORURINE, APPEARANCEUR, LABSPEC, PHURINE, GLUCOSEU, HGBUR, BILIRUBINUR, KETONESUR, PROTEINUR, UROBILINOGEN, NITRITE, LEUKOCYTESUR Sepsis Labs: @LABRCNTIP (procalcitonin:4,lacticidven:4) )No results found for this or any previous visit (from the past 240 hour(s)).   Radiological Exams on Admission: Dg Chest 2 View  12/13/2015  CLINICAL DATA:  Worsening shortness breath and hypoxia. Recent pneumonia, GI bleeding, myocardial infarction. EXAM: CHEST  2 VIEW COMPARISON:  None. FINDINGS: The heart size and mediastinal contours are within normal limits. Pulmonary hyperinflation and coarsening of interstitial lung markings is consistent with COPD. No evidence of pulmonary consolidation or frank pulmonary edema. No evidence of pneumothorax or  pleural effusion. Prior CABG noted. IMPRESSION: COPD.  No acute findings. Electronically Signed   By: Myles RosenthalJohn  Stahl M.D.   On: 12/13/2015 16:12     EKG: Independently reviewed. QTC 440,  ST depression and T-wave inverted the inferior leads.  Assessment/Plan Principal Problem:   CHF exacerbation (HCC) Active Problems:   GI bleed   Hypertension   Hypercholesteremia   CAD (coronary artery disease)   Protein-calorie malnutrition, moderate (HCC)   Elevated troponin   CHF exacerbation (HCC): 2-D echo on 10/03/14 showed EF 50-55 percent with aortic stenosis. Patient likely to have diastolic dysfunction. He is on Lasix 40 mg daily. Now presents with shortness of breath and elevated BNP. He also has ankle edema and positive JVD, consistent with CHF exacerbation.   -will admit to tele bed -Lasix 40 mg bid by IV -trop x 3 -2d echo -Risk factor stratification: A1c, FLP -will continue home coreg, ASA -start lisinopril 2.5 mg daily -Daily weights -strict I/O's -Low salt diet  HTN: -On IV Lasix -Newly started low-dose lisinopril -Continue Coreg, amlodipine  CAD and elevated trop: s/p of CABG. No CP. Trop 0.10-->0.11-->0.09.  Heparin was initially started by ED. His elevated troponin is most likely due to demanding ischemia secondary to CHF exacerbation. -D/C IV heparin -Continue aspirin, Coreg, Lipitor, Zetia, Plavix -When necessary nitroglycerin and morphine for chest pain -Continue Ranolazine  HLD: Last LDL was not on record -Continue home medications: Lipitor and Zetia -Check FLP  Protein-calorie malnutrition, moderate (HCC): Patient states that he lost 25 pounds of body weight recently. This is an intentional loss. -Counseled nutrition  DVT ppx: SQ Heparin  Code Status: Full code Family Communication: Yes, patient's wife at bed side Disposition Plan:  Anticipate discharge back to previous home environment Consults called: none Admission status:  (inpatient / tele)   Date of  Service 12/14/2015    Lorretta HarpIU, Teyon Odette [facility] Pager [phone]  If 7PM-7AM, please contact night-coverage [email or web address] Password [email or web address] 12/14/2015, 7:20 AM

## 2015-12-15 ENCOUNTER — Inpatient Hospital Stay (HOSPITAL_COMMUNITY): Payer: Medicare Other

## 2015-12-15 ENCOUNTER — Encounter (HOSPITAL_COMMUNITY): Payer: Self-pay | Admitting: Cardiology

## 2015-12-15 DIAGNOSIS — I472 Ventricular tachycardia, unspecified: Secondary | ICD-10-CM

## 2015-12-15 DIAGNOSIS — I25718 Atherosclerosis of autologous vein coronary artery bypass graft(s) with other forms of angina pectoris: Secondary | ICD-10-CM

## 2015-12-15 DIAGNOSIS — R7989 Other specified abnormal findings of blood chemistry: Secondary | ICD-10-CM

## 2015-12-15 DIAGNOSIS — I25810 Atherosclerosis of coronary artery bypass graft(s) without angina pectoris: Secondary | ICD-10-CM | POA: Diagnosis present

## 2015-12-15 DIAGNOSIS — I509 Heart failure, unspecified: Secondary | ICD-10-CM

## 2015-12-15 DIAGNOSIS — I10 Essential (primary) hypertension: Secondary | ICD-10-CM

## 2015-12-15 DIAGNOSIS — E7800 Pure hypercholesterolemia, unspecified: Secondary | ICD-10-CM

## 2015-12-15 DIAGNOSIS — I469 Cardiac arrest, cause unspecified: Secondary | ICD-10-CM

## 2015-12-15 DIAGNOSIS — I25118 Atherosclerotic heart disease of native coronary artery with other forms of angina pectoris: Secondary | ICD-10-CM

## 2015-12-15 HISTORY — DX: Ventricular tachycardia, unspecified: I47.20

## 2015-12-15 HISTORY — DX: Ventricular tachycardia: I47.2

## 2015-12-15 HISTORY — DX: Cardiac arrest, cause unspecified: I46.9

## 2015-12-15 LAB — TROPONIN I
TROPONIN I: 0.09 ng/mL — AB (ref <0.031)
TROPONIN I: 0.08 ng/mL — AB (ref <0.031)
TROPONIN I: 0.08 ng/mL — AB (ref <0.031)

## 2015-12-15 LAB — HEMOGLOBIN A1C
HEMOGLOBIN A1C: 5.8 % — AB (ref 4.8–5.6)
Mean Plasma Glucose: 120 mg/dL

## 2015-12-15 LAB — CBC
HCT: 37.1 % — ABNORMAL LOW (ref 39.0–52.0)
Hemoglobin: 12.3 g/dL — ABNORMAL LOW (ref 13.0–17.0)
MCH: 28.8 pg (ref 26.0–34.0)
MCHC: 33.2 g/dL (ref 30.0–36.0)
MCV: 86.9 fL (ref 78.0–100.0)
PLATELETS: 325 10*3/uL (ref 150–400)
RBC: 4.27 MIL/uL (ref 4.22–5.81)
RDW: 14.8 % (ref 11.5–15.5)
WBC: 10.7 10*3/uL — AB (ref 4.0–10.5)

## 2015-12-15 LAB — MAGNESIUM
MAGNESIUM: 1.9 mg/dL (ref 1.7–2.4)
Magnesium: 1.8 mg/dL (ref 1.7–2.4)

## 2015-12-15 LAB — COMPREHENSIVE METABOLIC PANEL
ALBUMIN: 3 g/dL — AB (ref 3.5–5.0)
ALT: 60 U/L (ref 17–63)
ANION GAP: 14 (ref 5–15)
AST: 43 U/L — ABNORMAL HIGH (ref 15–41)
Alkaline Phosphatase: 52 U/L (ref 38–126)
BILIRUBIN TOTAL: 0.7 mg/dL (ref 0.3–1.2)
BUN: 15 mg/dL (ref 6–20)
CHLORIDE: 97 mmol/L — AB (ref 101–111)
CO2: 24 mmol/L (ref 22–32)
Calcium: 8.7 mg/dL — ABNORMAL LOW (ref 8.9–10.3)
Creatinine, Ser: 1.39 mg/dL — ABNORMAL HIGH (ref 0.61–1.24)
GFR calc Af Amer: 55 mL/min — ABNORMAL LOW (ref >60)
GFR calc non Af Amer: 47 mL/min — ABNORMAL LOW (ref >60)
GLUCOSE: 370 mg/dL — AB (ref 65–99)
Potassium: 3.9 mmol/L (ref 3.5–5.1)
SODIUM: 135 mmol/L (ref 135–145)
TOTAL PROTEIN: 6.5 g/dL (ref 6.5–8.1)

## 2015-12-15 LAB — BASIC METABOLIC PANEL
ANION GAP: 14 (ref 5–15)
Anion gap: 12 (ref 5–15)
BUN: 17 mg/dL (ref 6–20)
BUN: 15 mg/dL (ref 6–20)
CHLORIDE: 104 mmol/L (ref 101–111)
CHLORIDE: 104 mmol/L (ref 101–111)
CO2: 23 mmol/L (ref 22–32)
CO2: 22 mmol/L (ref 22–32)
CREATININE: 1.33 mg/dL — AB (ref 0.61–1.24)
Calcium: 8.8 mg/dL — ABNORMAL LOW (ref 8.9–10.3)
Calcium: 9.4 mg/dL (ref 8.9–10.3)
Creatinine, Ser: 1.55 mg/dL — ABNORMAL HIGH (ref 0.61–1.24)
GFR calc Af Amer: 58 mL/min — ABNORMAL LOW (ref >60)
GFR calc non Af Amer: 50 mL/min — ABNORMAL LOW (ref >60)
GFR, EST AFRICAN AMERICAN: 48 mL/min — AB (ref >60)
GFR, EST NON AFRICAN AMERICAN: 41 mL/min — AB (ref >60)
GLUCOSE: 89 mg/dL (ref 65–99)
Glucose, Bld: 172 mg/dL — ABNORMAL HIGH (ref 65–99)
POTASSIUM: 3.7 mmol/L (ref 3.5–5.1)
Potassium: 3.8 mmol/L (ref 3.5–5.1)
SODIUM: 139 mmol/L (ref 135–145)
SODIUM: 140 mmol/L (ref 135–145)

## 2015-12-15 LAB — MRSA PCR SCREENING: MRSA BY PCR: NEGATIVE

## 2015-12-15 LAB — ECHOCARDIOGRAM COMPLETE
Height: 69.5 in
Weight: 2302.4 oz

## 2015-12-15 MED ORDER — ENSURE ENLIVE PO LIQD
237.0000 mL | Freq: Two times a day (BID) | ORAL | Status: DC
  Administered 2015-12-15 – 2015-12-18 (×6): 237 mL via ORAL

## 2015-12-15 MED ORDER — ADULT MULTIVITAMIN W/MINERALS CH
1.0000 | ORAL_TABLET | Freq: Every day | ORAL | Status: DC
  Administered 2015-12-15 – 2015-12-20 (×6): 1 via ORAL
  Filled 2015-12-15 (×7): qty 1

## 2015-12-15 MED ORDER — FUROSEMIDE 40 MG PO TABS
40.0000 mg | ORAL_TABLET | Freq: Two times a day (BID) | ORAL | Status: DC
  Administered 2015-12-16 – 2015-12-20 (×9): 40 mg via ORAL
  Filled 2015-12-15 (×10): qty 1

## 2015-12-15 MED ORDER — AMIODARONE LOAD VIA INFUSION
150.0000 mg | Freq: Once | INTRAVENOUS | Status: AC
  Administered 2015-12-15: 150 mg via INTRAVENOUS
  Filled 2015-12-15: qty 83.34

## 2015-12-15 MED ORDER — AMIODARONE HCL IN DEXTROSE 360-4.14 MG/200ML-% IV SOLN
30.0000 mg/h | INTRAVENOUS | Status: DC
  Administered 2015-12-15 – 2015-12-19 (×7): 30 mg/h via INTRAVENOUS
  Filled 2015-12-15 (×7): qty 200

## 2015-12-15 MED ORDER — AMIODARONE HCL IN DEXTROSE 360-4.14 MG/200ML-% IV SOLN
INTRAVENOUS | Status: AC
  Administered 2015-12-15: 60 mg/h
  Filled 2015-12-15: qty 200

## 2015-12-15 MED ORDER — AMIODARONE HCL IN DEXTROSE 360-4.14 MG/200ML-% IV SOLN
60.0000 mg/h | INTRAVENOUS | Status: AC
Start: 2015-12-15 — End: 2015-12-15
  Administered 2015-12-15: 60 mg/h via INTRAVENOUS
  Filled 2015-12-15: qty 200

## 2015-12-15 MED ORDER — POTASSIUM CHLORIDE CRYS ER 20 MEQ PO TBCR
20.0000 meq | EXTENDED_RELEASE_TABLET | Freq: Once | ORAL | Status: AC
  Administered 2015-12-15: 20 meq via ORAL
  Filled 2015-12-15: qty 1

## 2015-12-15 NOTE — Consult Note (Signed)
ELECTROPHYSIOLOGY CONSULT NOTE    Patient ID: Andre Wilson MRN: [id number], DOB/AGE: August 02, 1936 80 y.o.  Admit date: 12/13/2015 Date of Consult: 12/15/2015  Primary Physician: No primary care provider on file. Primary Cardiologist: [facility] in [place], [place]  Reason for Consultation: VT  HPI:  Andre Wilson is a 80 y.o. male with a past medical history significant for CAD s/p CABG, hypertension, hyperlipidemia, prior GI bleeding who presented on the day of admission with increased shortness of breath and LE edema. He lives in [place] but was here visiting his daughter.    Last echo 10/03/14 demonstrated EF 50-55% with aortic stenosis Cath 06/2015 demonstrated CTO of mid LAD, prox LCx and ostial RCA; occluded SVG; patent LIMA-LAD with faint collaterals to RCA.  Was seen at [facility] for second opinion who recommended medical therapy and could consider stem cell therapy if available in the future.   This morning, he developed sustained monomorphic VT with LOC and underwent brief CPR.  He was started on IV amiodarone and given Mg with improvement in rhythm.  No shocks and no intubation.   He currently denies chest pain, shortness of breath, recent fevers, chills, nausea or vomiting.   Past Medical History  Diagnosis Date  . MI (myocardial infarction) (HCC)   . GI bleed   . Hypertension   . Hypercholesteremia   . CAD (coronary artery disease)   . Sustained VT (ventricular tachycardia) (HCC) 12/15/2015  . Cardiac arrest [facility]) secondary to sustained V tach requiring CPR 12/15/15 12/15/2015     Surgical History:  Past Surgical History  Procedure Laterality Date  . Cardiac catheterization      CABG 1987     Prescriptions prior to admission  Medication Sig Dispense Refill Last Dose  . amLODipine (NORVASC) 10 MG tablet Take 10 mg by mouth daily.   12/13/2015 at Unknown time  . aspirin 325 MG EC tablet Take 325 mg by mouth daily.   12/13/2015 at Unknown time  . atorvastatin  (LIPITOR) 40 MG tablet Take 40 mg by mouth daily.   12/13/2015 at Unknown time  . carvedilol (COREG) 3.125 MG tablet Take 3.125 mg by mouth 2 (two) times daily with a meal.   12/13/2015 at 0930  . clopidogrel (PLAVIX) 75 MG tablet Take 75 mg by mouth daily.   12/13/2015 at Unknown time  . ezetimibe (ZETIA) 10 MG tablet Take 10 mg by mouth daily.   12/13/2015 at Unknown time  . ferrous sulfate 325 (65 FE) MG EC tablet Take 325 mg by mouth 3 (three) times daily with meals.   12/13/2015 at am  . folic acid (FOLVITE) 1 MG tablet Take 1 mg by mouth daily.   12/13/2015 at Unknown time  . furosemide (LASIX) 40 MG tablet Take 40 mg by mouth daily.   12/13/2015 at Unknown time  . latanoprost (XALATAN) 0.005 % ophthalmic solution Place 1 drop into both eyes at bedtime.  0 12/12/2015  . linaclotide (LINZESS) 145 MCG CAPS capsule Take 145 mcg by mouth daily as needed (for constipation).   not started  . nitroGLYCERIN (NITROSTAT) 0.4 MG SL tablet Place 0.4 mg under the tongue every 5 (five) minutes as needed for chest pain.   not used  . ranolazine (RANEXA) 500 MG 12 hr tablet Take 500 mg by mouth 2 (two) times daily.   12/13/2015 at 0930  . timolol (TIMOPTIC) 0.5 % ophthalmic solution Place 1 drop into both eyes 2 (two) times daily.  0 12/12/2015  .  vitamin C (ASCORBIC ACID) 500 MG tablet Take 500 mg by mouth 2 (two) times daily.   12/13/2015 at am    Inpatient Medications:  . amiodarone      . amLODipine  10 mg Oral Daily  . aspirin  324 mg Oral Once  . aspirin  325 mg Oral Daily  . atorvastatin  40 mg Oral Daily  . carvedilol  3.125 mg Oral BID WC  . clopidogrel  75 mg Oral Daily  . ezetimibe  10 mg Oral Daily  . ferrous sulfate  325 mg Oral TID WC  . folic acid  1 mg Oral Daily  . furosemide  40 mg Intravenous Q12H  . heparin subcutaneous  5,000 Units Subcutaneous Q8H  . latanoprost  1 drop Both Eyes QHS  . lisinopril  2.5 mg Oral Daily  . ranolazine  500 mg Oral BID  . timolol  1 drop Both Eyes BID  .  vitamin C  500 mg Oral BID    Allergies: No Known Allergies  Social History   Social History  . Marital Status: N/A    Spouse Name: N/A  . Number of Children: N/A  . Years of Education: N/A   Occupational History  . Not on file.   Social History Main Topics  . Smoking status: Former Games developer  . Smokeless tobacco: Not on file  . Alcohol Use: No  . Drug Use: No  . Sexual Activity: Not on file   Other Topics Concern  . Not on file   Social History Narrative     Family History  Problem Relation Age of Onset  . Hypertension Mother   . Hyperlipidemia Mother   . Heart attack Father      Review of Systems: All other systems reviewed and are otherwise negative except as noted above.  Physical Exam: Filed Vitals:   12/15/15 1610 12/15/15 0901 12/15/15 0915 12/15/15 0921  BP: 128/80 106/73 85/71 95/84   Pulse: 105 96 85 73  Temp: 97.3 F (36.3 C)     TempSrc: Oral     Resp: 19     Height:      Weight:      SpO2: 94% 100%      GEN- The patient is elderly appearing, alert and oriented x 3 today.   HEENT: normocephalic, atraumatic; sclera clear, conjunctiva pink; hearing intact; oropharynx clear; neck supple  Lungs- Clear to ausculation bilaterally, normal work of breathing.  No wheezes, rales, rhonchi Heart- Regular rate and rhythm, faint systolic murmur  GI- soft, non-tender, non-distended, bowel sounds present  Extremities- no clubbing, cyanosis, or edema  MS- no significant deformity or atrophy Skin- warm and dry, no rash or lesion Psych- euthymic mood, full affect Neuro- strength and sensation are intact  Labs:   Lab Results  Component Value Date   WBC 10.7* 12/15/2015   HGB 12.3* 12/15/2015   HCT 37.1* 12/15/2015   MCV 86.9 12/15/2015   PLT 325 12/15/2015    Recent Labs Lab 12/13/15 1552  12/15/15 0907  NA 141  < > 140  K 3.9  < > 3.7  CL 107  < > 104  CO2 22  < > 22  BUN 11  < > 15  CREATININE 1.16  < > 1.55*  CALCIUM 9.1  < > 9.4  PROT 6.5   --   --   BILITOT 0.9  --   --   ALKPHOS 54  --   --   ALT 22  --   --  AST 19  --   --   GLUCOSE 104*  < > 172*  < > = values in this interval not displayed.    Radiology/Studies: Dg Chest 2 View 12/13/2015  CLINICAL DATA:  Worsening shortness breath and hypoxia. Recent pneumonia, GI bleeding, myocardial infarction. EXAM: CHEST  2 VIEW COMPARISON:  None. FINDINGS: The heart size and mediastinal contours are within normal limits. Pulmonary hyperinflation and coarsening of interstitial lung markings is consistent with COPD. No evidence of pulmonary consolidation or frank pulmonary edema. No evidence of pneumothorax or pleural effusion. Prior CABG noted. IMPRESSION: COPD.  No acute findings. Electronically Signed   By: Myles RosenthalJohn  Stahl M.D.   On: 12/13/2015 16:12    JXB:JYNWGEKG:sinus rhythm, rate 95, LVH, PVC  TELEMETRY: sinus rhythm with sustained monomorphic VT   Assessment/Plan: 1.  Ventricular tachycardia The patient had sustained ventricular tachycardia that terminated following IV amiodarone and Mg.   Agree with continuing IV amiodarone load x24-48 hours then convert to po LFT's, TSH in the morning, Triniti Gruetzmacher need PFT's as an outpatient Update echo Keep K >3.9, Mg >1.8 He Tashema Tiller need consideration for secondary prevention ICD once more stable  2. CAD s/p CABG No recent ischemic symptoms Recent ischemic work up reviewed Medical therapy per primary team Echo pending  EP Gladys Deckard follow with general cardiology while here.   Signed, Gypsy BalsamAmber Seiler, NP 12/15/2015 10:05 AM   I have seen and examined this patient with Gypsy BalsamAmber Seiler.  Agree with above, note added to reflect my findings.  On exam, regular rhythm, 2/6 systolic murmur at LLSB, lungs clear.    He initially reported to the hospital, but had a  VT arrest while in the hospital today. He has a history of coronary artery disease status post CABG with a patent  LIMA  To LAD. He was started on IV amiodarone and given magnesium with improvement in his  rhythm. He did not require defibrillation.aat this time, we are waiting on echo to determine what his ejection fraction is. In the mean time, would continue IV amiodarone.  I discussed with him the options of ICD implant.  He is interested in this and we Toniya Rozar plan for Thursday.  I also told him that he should not be driving due to his arrest.  Angles Trevizo M. Hamid Brookens MD 12/15/2015 10:43 AM

## 2015-12-15 NOTE — Progress Notes (Signed)
OT Cancellation Note  Patient Details Name: Andre Wilson MRN: [id number] DOB: 1936/05/15   Cancelled Treatment:    Reason Eval/Treat Not Completed: Medical issues which prohibited therapy (code blue called)  Felecia ShellingJones, Amamda Curbow B   Izaiah Tabb, Brynn   OTR/L Pager: [phone] Office: [phone] .  12/15/2015, 8:52 AM

## 2015-12-15 NOTE — Care Management Note (Signed)
Case Management Note  Patient Details  Name: Andre Wilson MRN: [id number] Date of Birth: 1935/10/19  Subjective/Objective:      Adm w shortness of breath             Action/Plan:lives w fam   Expected Discharge Date:                  Expected Discharge Plan:  Home/Self Care  In-House Referral:     Discharge planning Services  CM Consult  Post Acute Care Choice:    Choice offered to:     DME Arranged:    DME Agency:     HH Arranged:    HH Agency:     Status of Service:     Medicare Important Message Given:    Date Medicare IM Given:    Medicare IM give by:    Date Additional Medicare IM Given:    Additional Medicare Important Message give by:     If discussed at Long Length of Stay Meetings, dates discussed:    Additional Comments: was indep w phy there and occup there. Will moniter for dc needs as pt progresses.  Hanley Haysowell, [name], RN 12/15/2015, 10:45 AM

## 2015-12-15 NOTE — Code Documentation (Signed)
CODE BLUE NOTE  Patient Name: Andre Wilson   MRN: [id number]   Date of Birth/ Sex: 08/09/1936 , male      Admission Date: 12/13/2015  Attending Provider: Leroy SeaPrashant K Singh, MD  Primary Diagnosis: CHF exacerbation [facility](HCC)    Indication: Pt was in his usual state of health until this AM, when he was noted to be in VT. Code blue was subsequently called. At the time of arrival on scene, ACLS protocol was underway.    Technical Description:  - CPR performance duration:  2  minutes  - Was defibrillation or cardioversion used? No   - Was external pacer placed? No  - Was patient intubated pre/post CPR? No    Medications Administered: Y = Yes; Blank = No Amiodarone    Atropine    Calcium    Epinephrine    Lidocaine    Magnesium    Norepinephrine    Phenylephrine    Sodium bicarbonate    Vasopressin      Post CPR evaluation:  - Final Status - Was patient successfully resuscitated ? Yes - What is current rhythm? Sinus rythm - What is current hemodynamic status? stable   Miscellaneous Information:  - Labs sent, including: Stat BMP, Troponin, EKG, CXR  - Primary team notified?  Yes  - Family Notified? No  - Additional notes/ transfer status:  patient left in care of primary team. Plan was to transfer to ICU   Almon Herculesaye T Keyly Baldonado, MD  12/15/2015, 6:03 PM

## 2015-12-15 NOTE — Progress Notes (Signed)
Pt arrived from [address] with bedside RN and Rapid Response RN after VT arrest. Pt moved to ICU bed. Pt answers questions appropriately and is oriented.  Pt's current rhythm SR 80's-90's with occasional mulitfocal PVC's. BP stable. STAT CXR and ECHO ordered. BMP in process. Plan reviewed with Cardiology and EP for Amio gtt and electrolyte monitoring.  Per EP, a MAP goal > 60-65 was established.  Family updated at bedside. Will continue to monitor pt closely.

## 2015-12-15 NOTE — Consult Note (Signed)
Reason for Consult: cardiac arrest with sustained V tach requiring CPR   Referring Physician: Dr. Candiss Norse  PCP:  No primary care provider on file.  Primary Belle, at [facility] has been seen at [facility] for second opinion  Andre Wilson is an 80 y.o. male.    Chief Complaint: admitted 12/13/15 for SOB and ankle edema.  He was in [place] visiting his daughter.  HPI:  Asked to see 80 y.o. male with medical history significant of hypertension, hyperlipidemia, GI bleeding, CAD, myocardial infarction, S/P of CABG 1987, GI bleeding, and PAD who presented with shortness of breath and bilateral ankle edema 12/14/15.  BNP was elevated 1431, 2-D echo on 10/03/14 showed EF 50-55 percent with aortic stenosis.  He had LHC and LE angiography in Feb 2016 and again in Nov 2016 at [facility] and in an office based lab in [place].  Review of cath:  CTO of mid LAD, prox LCx, and ostial RCA; occluded SVGs; patent LIMA-LAD with collaterals to RCA (faint)  [facility] recommended medical therapy and could consider advanced CAD therapy (cell therapy) if available in future.   Review of peripheral angiography: Severe calcified occlusion of bilateral SFA; prox R SFA occlusion with early reconstitution; prox-mid-distal occlusion of L SFA  Pt on meds as below.   This AM he developed sustained Vtach at 8:35 loss of consciousness and pulse had CPR, he would come and go with Vtach until about 8:52 when he came out.  No Shocks and no intubation.   Pt alert and oriented at time of this exam.  No chest pain and no pain prior to admit.    He has rec'd magnesium and IV amiodarone now.  His troponin at 0.10>>0.11>>0.09>>0.09.   K+ 3.8.  Cr. 1.33     EKG SR with Qtc 467 ms no acute changes + PVCs.  Lipid Panel     Component Value Date/Time   CHOL 159 12/14/2015 0240   TRIG 56 12/14/2015 0240   HDL 28* 12/14/2015 0240   CHOLHDL 5.7 12/14/2015 0240   VLDL 11 12/14/2015 0240   LDLCALC 120* 12/14/2015 0240     Past Medical History  Diagnosis Date  . MI (myocardial infarction) ([place])   . GI bleed   . Hypertension   . Hypercholesteremia   . CAD (coronary artery disease)   . Sustained VT (ventricular tachycardia) ([place]) 12/15/2015  . Cardiac arrest [facility]) secondary to sustained V tach requiring CPR 12/15/15 12/15/2015    Past Surgical History  Procedure Laterality Date  . Cardiac catheterization      CABG 1987    Family History  Problem Relation Age of Onset  . Hypertension Mother   . Hyperlipidemia Mother   . Heart attack Father    Social History:  reports that he has quit smoking. He does not have any smokeless tobacco history on file. He reports that he does not drink alcohol or use illicit drugs.  Allergies: No Known Allergies  OUTPATIENT MEDICATIONS: Norvasc, asa, lipitor, coreg, plavix, zetia, iron, folic acid lasix, linzess, ranexa, timoptic, vit c  CURRENT MEDICATIONS: Scheduled Meds: . amiodarone      . amiodarone  150 mg Intravenous Once  . amLODipine  10 mg Oral Daily  . aspirin  324 mg Oral Once  . aspirin  325 mg Oral Daily  . atorvastatin  40 mg Oral Daily  . carvedilol  3.125 mg Oral BID WC  . clopidogrel  75  mg Oral Daily  . ezetimibe  10 mg Oral Daily  . ferrous sulfate  325 mg Oral TID WC  . folic acid  1 mg Oral Daily  . furosemide  40 mg Intravenous Q12H  . heparin subcutaneous  5,000 Units Subcutaneous Q8H  . latanoprost  1 drop Both Eyes QHS  . lisinopril  2.5 mg Oral Daily  . ranolazine  500 mg Oral BID  . timolol  1 drop Both Eyes BID  . vitamin C  500 mg Oral BID   Continuous Infusions: . amiodarone Stopped (12/15/15 0925)   Followed by  . amiodarone     PRN Meds:.acetaminophen, linaclotide, morphine injection, nitroGLYCERIN, ondansetron (ZOFRAN) IV, zolpidem   Results for orders placed or performed during the hospital encounter of 12/13/15 (from the past 48 hour(s))  I-Stat CG4 Lactic Acid, ED     Status: None    Collection Time: 12/13/15  3:49 PM  Result Value Ref Range   Lactic Acid, Venous 1.50 0.5 - 2.0 mmol/L  CBC with Differential/Platelet     Status: Abnormal   Collection Time: 12/13/15  3:52 PM  Result Value Ref Range   WBC 11.3 (H) 4.0 - 10.5 K/uL   RBC 4.24 4.22 - 5.81 MIL/uL   Hemoglobin 11.7 (L) 13.0 - 17.0 g/dL   HCT 36.5 (L) 39.0 - 52.0 %   MCV 86.1 78.0 - 100.0 fL   MCH 27.6 26.0 - 34.0 pg   MCHC 32.1 30.0 - 36.0 g/dL   RDW 14.6 11.5 - 15.5 %   Platelets 279 150 - 400 K/uL   Neutrophils Relative % 78 %   Neutro Abs 8.8 (H) 1.7 - 7.7 K/uL   Lymphocytes Relative 13 %   Lymphs Abs 1.5 0.7 - 4.0 K/uL   Monocytes Relative 8 %   Monocytes Absolute 0.9 0.1 - 1.0 K/uL   Eosinophils Relative 1 %   Eosinophils Absolute 0.1 0.0 - 0.7 K/uL   Basophils Relative 0 %   Basophils Absolute 0.0 0.0 - 0.1 K/uL  Comprehensive metabolic panel     Status: Abnormal   Collection Time: 12/13/15  3:52 PM  Result Value Ref Range   Sodium 141 135 - 145 mmol/L   Potassium 3.9 3.5 - 5.1 mmol/L   Chloride 107 101 - 111 mmol/L   CO2 22 22 - 32 mmol/L   Glucose, Bld 104 (H) 65 - 99 mg/dL   BUN 11 6 - 20 mg/dL   Creatinine, Ser 1.16 0.61 - 1.24 mg/dL   Calcium 9.1 8.9 - 10.3 mg/dL   Total Protein 6.5 6.5 - 8.1 g/dL   Albumin 2.9 (L) 3.5 - 5.0 g/dL   AST 19 15 - 41 U/L   ALT 22 17 - 63 U/L   Alkaline Phosphatase 54 38 - 126 U/L   Total Bilirubin 0.9 0.3 - 1.2 mg/dL   GFR calc non Af Amer 59 (L) >60 mL/min   GFR calc Af Amer >60 >60 mL/min    Comment: (NOTE) The eGFR has been calculated using the CKD EPI equation. This calculation has not been validated in all clinical situations. eGFR's persistently <60 mL/min signify possible Chronic Kidney Disease.    Anion gap 12 5 - 15  Troponin I     Status: Abnormal   Collection Time: 12/13/15  3:52 PM  Result Value Ref Range   Troponin I 0.10 (H) <0.031 ng/mL    Comment:        PERSISTENTLY INCREASED TROPONIN VALUES  IN THE RANGE OF  0.04-0.49 ng/mL CAN BE SEEN IN:       -UNSTABLE ANGINA       -CONGESTIVE HEART FAILURE       -MYOCARDITIS       -CHEST TRAUMA       -ARRYHTHMIAS       -LATE PRESENTING MYOCARDIAL INFARCTION       -COPD   CLINICAL FOLLOW-UP RECOMMENDED.   Brain natriuretic peptide     Status: Abnormal   Collection Time: 12/13/15  3:53 PM  Result Value Ref Range   B Natriuretic Peptide 1431.1 (H) 0.0 - 100.0 pg/mL  Protime-INR     Status: None   Collection Time: 12/13/15  9:00 PM  Result Value Ref Range   Prothrombin Time 14.9 11.6 - 15.2 seconds   INR 1.16 0.00 - 1.49  APTT     Status: Abnormal   Collection Time: 12/13/15  9:00 PM  Result Value Ref Range   aPTT 67 (H) 24 - 37 seconds    Comment:        IF BASELINE aPTT IS ELEVATED, SUGGEST PATIENT RISK ASSESSMENT BE USED TO DETERMINE APPROPRIATE ANTICOAGULANT THERAPY.   Troponin I     Status: Abnormal   Collection Time: 12/13/15  9:00 PM  Result Value Ref Range   Troponin I 0.11 (H) <0.031 ng/mL    Comment:        PERSISTENTLY INCREASED TROPONIN VALUES IN THE RANGE OF 0.04-0.49 ng/mL CAN BE SEEN IN:       -UNSTABLE ANGINA       -CONGESTIVE HEART FAILURE       -MYOCARDITIS       -CHEST TRAUMA       -ARRYHTHMIAS       -LATE PRESENTING MYOCARDIAL INFARCTION       -COPD   CLINICAL FOLLOW-UP RECOMMENDED.   Heparin level (unfractionated)     Status: Abnormal   Collection Time: 12/14/15  2:40 AM  Result Value Ref Range   Heparin Unfractionated 0.12 (L) 0.30 - 0.70 IU/mL    Comment:        IF HEPARIN RESULTS ARE BELOW EXPECTED VALUES, AND PATIENT DOSAGE HAS BEEN CONFIRMED, SUGGEST FOLLOW UP TESTING OF ANTITHROMBIN III LEVELS.   CBC     Status: Abnormal   Collection Time: 12/14/15  2:40 AM  Result Value Ref Range   WBC 8.7 4.0 - 10.5 K/uL   RBC 3.94 (L) 4.22 - 5.81 MIL/uL   Hemoglobin 11.0 (L) 13.0 - 17.0 g/dL   HCT 34.0 (L) 39.0 - 52.0 %   MCV 86.3 78.0 - 100.0 fL   MCH 27.9 26.0 - 34.0 pg   MCHC 32.4 30.0 - 36.0 g/dL   RDW  14.6 11.5 - 15.5 %   Platelets 272 150 - 400 K/uL  Basic metabolic panel     Status: Abnormal   Collection Time: 12/14/15  2:40 AM  Result Value Ref Range   Sodium 141 135 - 145 mmol/L   Potassium 3.1 (L) 3.5 - 5.1 mmol/L    Comment: DELTA CHECK NOTED   Chloride 106 101 - 111 mmol/L   CO2 22 22 - 32 mmol/L   Glucose, Bld 82 65 - 99 mg/dL   BUN 11 6 - 20 mg/dL   Creatinine, Ser 1.15 0.61 - 1.24 mg/dL   Calcium 8.8 (L) 8.9 - 10.3 mg/dL   GFR calc non Af Amer 60 (L) >60 mL/min   GFR calc Af Amer >60 >60 mL/min  Comment: (NOTE) The eGFR has been calculated using the CKD EPI equation. This calculation has not been validated in all clinical situations. eGFR's persistently <60 mL/min signify possible Chronic Kidney Disease.    Anion gap 13 5 - 15  Troponin I     Status: Abnormal   Collection Time: 12/14/15  2:40 AM  Result Value Ref Range   Troponin I 0.09 (H) <0.031 ng/mL    Comment:        PERSISTENTLY INCREASED TROPONIN VALUES IN THE RANGE OF 0.04-0.49 ng/mL CAN BE SEEN IN:       -UNSTABLE ANGINA       -CONGESTIVE HEART FAILURE       -MYOCARDITIS       -CHEST TRAUMA       -ARRYHTHMIAS       -LATE PRESENTING MYOCARDIAL INFARCTION       -COPD   CLINICAL FOLLOW-UP RECOMMENDED.   Lipid panel     Status: Abnormal   Collection Time: 12/14/15  2:40 AM  Result Value Ref Range   Cholesterol 159 0 - 200 mg/dL   Triglycerides 56 <150 mg/dL   HDL 28 (L) >40 mg/dL   Total CHOL/HDL Ratio 5.7 RATIO   VLDL 11 0 - 40 mg/dL   LDL Cholesterol 120 (H) 0 - 99 mg/dL    Comment:        Total Cholesterol/HDL:CHD Risk Coronary Heart Disease Risk Table                     Men   Women  1/2 Average Risk   3.4   3.3  Average Risk       5.0   4.4  2 X Average Risk   9.6   7.1  3 X Average Risk  23.4   11.0        Use the calculated Patient Ratio above and the CHD Risk Table to determine the patient's CHD Risk.        ATP III CLASSIFICATION (LDL):  <100     mg/dL   Optimal  100-129   mg/dL   Near or Above                    Optimal  130-159  mg/dL   Borderline  160-189  mg/dL   High  >190     mg/dL   Very High   Troponin I     Status: Abnormal   Collection Time: 12/14/15  8:42 AM  Result Value Ref Range   Troponin I 0.09 (H) <0.031 ng/mL    Comment:        PERSISTENTLY INCREASED TROPONIN VALUES IN THE RANGE OF 0.04-0.49 ng/mL CAN BE SEEN IN:       -UNSTABLE ANGINA       -CONGESTIVE HEART FAILURE       -MYOCARDITIS       -CHEST TRAUMA       -ARRYHTHMIAS       -LATE PRESENTING MYOCARDIAL INFARCTION       -COPD   CLINICAL FOLLOW-UP RECOMMENDED.   Basic metabolic panel     Status: Abnormal   Collection Time: 12/15/15  2:50 AM  Result Value Ref Range   Sodium 139 135 - 145 mmol/L   Potassium 3.8 3.5 - 5.1 mmol/L    Comment: DELTA CHECK NOTED   Chloride 104 101 - 111 mmol/L   CO2 23 22 - 32 mmol/L   Glucose, Bld 89 65 - 99  mg/dL   BUN 17 6 - 20 mg/dL   Creatinine, Ser 1.33 (H) 0.61 - 1.24 mg/dL   Calcium 8.8 (L) 8.9 - 10.3 mg/dL   GFR calc non Af Amer 50 (L) >60 mL/min   GFR calc Af Amer 58 (L) >60 mL/min    Comment: (NOTE) The eGFR has been calculated using the CKD EPI equation. This calculation has not been validated in all clinical situations. eGFR's persistently <60 mL/min signify possible Chronic Kidney Disease.    Anion gap 12 5 - 15  Magnesium     Status: None   Collection Time: 12/15/15  2:50 AM  Result Value Ref Range   Magnesium 1.8 1.7 - 2.4 mg/dL   Dg Chest 2 View  12/13/2015  CLINICAL DATA:  Worsening shortness breath and hypoxia. Recent pneumonia, GI bleeding, myocardial infarction. EXAM: CHEST  2 VIEW COMPARISON:  None. FINDINGS: The heart size and mediastinal contours are within normal limits. Pulmonary hyperinflation and coarsening of interstitial lung markings is consistent with COPD. No evidence of pulmonary consolidation or frank pulmonary edema. No evidence of pneumothorax or pleural effusion. Prior CABG noted. IMPRESSION: COPD.   No acute findings. Electronically Signed   By: Earle Gell M.D.   On: 12/13/2015 16:12    ROS: General:no colds or fevers, + weight gain with edema Skin:no rashes or ulcers HEENT:no blurred vision, no congestion CV:see HPI PUL:see HPI GI:no diarrhea constipation or melena--GI bleed in March , no indigestion GU:no hematuria, no dysuria MS:no joint pain, no claudication Neuro:no syncope, no lightheadedness Endo:no diabetes, no thyroid disease  Blood pressure 106/73, pulse 96, temperature 97.3 F (36.3 C), temperature source Oral, resp. rate 19, height 5' 9.5" (1.765 m), weight 143 lb 14.4 oz (65.273 kg), SpO2 100 %.  Wt Readings from Last 3 Encounters:  12/15/15 143 lb 14.4 oz (65.273 kg)    PE: General:Pleasant affect, NAD, no pain Skin:Warm and dry, brisk capillary refill HEENT:normocephalic, sclera clear, mucus membranes moist Neck:supple, + JVD, no bruits  Heart:S1S2 RRR with soft systolic 1/6 murmur, gallup, rub or click Lungs:clear without rales, rhonchi, or wheezes VOJ:JKKX, non tender, + BS, do not palpate liver spleen or masses Ext:no lower ext edema, ?pedal pulses, 2+ radial pulses Neuro:alert and oriented X 3, MAE, follows commands, + facial symmetry post arrest   Assessment/Plan Principal Problem:   CHF exacerbation (HCC) Active Problems:   GI bleed   Hypertension   Hypercholesteremia   CAD (coronary artery disease)   Protein-calorie malnutrition, moderate (HCC)   Elevated troponin   Sustained VT (ventricular tachycardia) ([place])   Cardiac arrest ([place]) secondary to sustained V tach requiring CPR 12/15/15  Sustained VT with cardiac arrest and CPR now resolved. --IV amiodarone bolus and drip and IV magnesium. --pt to be transferred to ICU --plan EP consult --Magnesium was 1.8 this AM K+ was 3.8 recheck at 1200 --Qtc 467 ms post arrest --Echo pending  Severe native CAD with occluded RCA, mid LAD and prox LCx; 1/4 grafts patent with LIMA-LAD (collaterals  faintly seen to RCA)  --increase of troponin on admit--possible demand ischemia --second opinion rec'd at [facility] see Care anywhere, no targets for re-do CABG  Recommended    medical therapy he is on coreg, normasc, ranexa, lasix, plavix asa, zetia and lipitor --repeat troponins are ordered.  --no plans for repeat cath currently, echo will guide along with EP  Acute on chronic HF may be due to ischemia will recheck echo EF a year ago was 55-60% recheck labs.  Wt from 147 or 145 to 143 since admit.  But fluids +1330. Since admit  Elevated troponin- possible ischemia causing HF   Hyperlipidemia goal LDL <70 will increase lipitor to 80  Aortic Stenosis   PAD- significant  Dr. Ellyn Hack has seen the pt as well   [facility]  Nurse Practitioner Certified [employer] [place] Pager [phone] or after 5pm or weekends call [phone] 12/15/2015, 9:13 AM

## 2015-12-15 NOTE — Clinical Documentation Improvement (Signed)
Internal Medicine  Can the diagnosis of "Worsening shortness breath and hypoxia" be further specified? Please document response in next progress note including type and acuity of if applicable. Thank you!   Other  Clinically Undetermined  Document any associated diagnoses/conditions.  Supporting Information:  VT arrest with brief loss of consciousness; CPR initiated  Started on IV Amiodarone and given Mg with improvement of rhythm  Placed in 6L FiO2  Please exercise your independent, professional judgment when responding. A specific answer is not anticipated or expected.  Thank You,  Shellee MiloEileen T Abem Shaddix RN, BSN, CCDS Health Information Management [place] [phone]; Cell: [phone]

## 2015-12-15 NOTE — Progress Notes (Addendum)
PROGRESS NOTE                                                                                                                                                                                                             Patient Demographics:    Andre Wilson, is a 80 y.o. male, DOB - 1935-10-23, EAV:[id number]  Admit date - 12/13/2015   Admitting Physician Lorretta Harp, MD  Outpatient Primary MD for the patient in [place] LOS - 2  Outpatient Specialists: Cardiologist at [facility] and in [facility] Complaint  Patient presents with  . Shortness of Breath       Brief Narrative -   Andre Wilson is a 80 y.o. male with medical history significant of hypertension, hyperlipidemia, GI bleeding, CAD, myocardial infarction, S/P of CABG 1987, GI bleeding, who presents with shortness of breath and bilateral ankle edema.  Patient reports that he has been having shortness of breath in the past several days, which has been progressively getting worse. He also noticed bilateral ankle fluid retention. He has mild dry cough, but no chest pain, fever or chills. Patient does not have nausea, vomiting, abdominal pain, diarrhea or symptoms of UTI. No acute laterally weakness.  ED Course: pt was found to have BNP 1431, troponin 0.10, lactate 1.50, WBC 11.3, temperature 99, tachycardia, electrolytes and renal function okay. Chest x-ray showed COPD change, but no infiltration. Patient is admitted to inpatient for further reduction treatment.  After being admitted to the hospital he was being treated for CHF, his echocardiogram was pending, he was close to his baseline on 12/15/2015 morning, however around 8:45 AM on 12/15/2015 patient went into a prolonged V. tach rhythm and became briefly unresponsive, he never lost his pulse, he was given IV magnesium, placed on IV amiodarone, stat echogram and cardiology consult was obtained and he was  transferred to stepdown/ICU status.    Subjective:    Andre Wilson today has, No headache, No chest pain, No abdominal pain - No Nausea, No new weakness tingling or numbness, No Cough - Improved shortness of breath.   Assessment  & Plan :     1. Acute on chronic Combined systolic and diastolic CHF last EF more than one year ago was 55% due to ischemic cardiomyopathy EF now 25%. He was much improved after IV Lasix, he is  already on ACE inhibitor along with Coreg and aspirin, Much improved after IV Lasix, continue ACE inhibitor along with Coreg and aspirin, he was feeling much better on day 2 and was awaiting his echocardiogram and eager to go home. However around 8:45 AM on 12/15/2015 he had a prolonged run of V. tach and became briefly unresponsive.    He was given IV magnesium, IV amiodarone per protocol, given his beta blocker and ACE inhibitor dose, EKG was nonacute, cardiology was consulted and he was transferred to stepdown/ICU. Stat repeat echo was obtained showing EF has now dropped from 50% to 25% with global hypokinesis and likely moderate AS and severe MR. We'll defer further management to cardiology. From the CHF standpoint he seems to have improved and will transition to oral Lasix.  2. CAD. Troponin elevated in non-ACS pattern, chest pain-free, EKG nonacute, mildly elevated troponin due to CHF with demand ischemia, completely chest pain-free, continue aspirin, Coreg, statin, Zetia,  Plavix and Ranexa for secondary prevention. His notes from [facility] cardiology evaluation in December 2016 were reviewed where all his native coronaries were majorly affected, his CABG graft seemed to be patent. His EF was 50%, note for prolonged V. tach cardiology has been consulted will defer further management to cardiology.  3. Dyslipidemia. On statin and Zetia.Marland Kitchen.  4. Moderate PCM. Nutritionist consult, outpatient PCP follow-up for weight loss for age-appropriate workup.  5. V. tach with new drop in EF  from 50% to 25% and global hypokinesis. Transferred to stepdown/ICU, IV magnesium given on 12/15/2015, electrolytes stable, on IV amiodarone, continue Coreg and ACE inhibitor. Cardiology consulted. He may need AICD.  6. Moderate to severe MR and AS. Defer to cardiology.  7. Essential hypertension. Stop Norvasc, currently on Coreg, ACE inhibitor will monitor blood pressure.    Code Status : Full  Family Communication  : Updated daughter on the phone on 12/15/2015  Disposition Plan  : Home 1-2 days  Barriers For Discharge : CHF and V. tach treatment  Consults  :   Cardiology  Procedures  :   TTE - EF is dropped from 50% one year ago to 25% with global hypokinesis and at least moderate to severe AS, severe MR. Some diastolic dysfunction as well.  Global hypokinesis with akinesis of the inferolateral wall; overall severely reduced LV function; restrictive filling with elevated LV filling pressure; moderate LVE; mild LAE; moderately reduced RV function; calcified aortic valve with fusion of right and left cusps; severe AS by continuity equation (AVA 0.75 cm2);  however this appears likely to be overestimated as mean gradient 10 mmHg and dimensionless index 0.39 not supportive of severe AS; mild AI; moderate to severe MR; trace TR with mildly elevated  pulmonary pressure.    DVT Prophylaxis  :  Heparin   Lab Results  Component Value Date   PLT 325 12/15/2015    Antibiotics  :     Anti-infectives    None        Objective:   Filed Vitals:   12/15/15 0921 12/15/15 1000 12/15/15 1015 12/15/15 1030  BP: 95/84 86/56 97/64  90/59  Pulse: 73 88 88 86  Temp:      TempSrc:      Resp:  18 16 22   Height:      Weight:      SpO2:  98% 99% 99%    Wt Readings from Last 3 Encounters:  12/15/15 65.273 kg (143 lb 14.4 oz)     Intake/Output Summary (Last 24 hours)  at 12/15/15 1120 Last data filed at 12/15/15 0817  Gross per 24 hour  Intake   2620 ml  Output    975 ml  Net   1645  ml     Physical Exam  Awake Alert, Oriented X 3, No new F.N deficits, Normal affect [place].AT,PERRAL Supple Neck,No JVD, No cervical lymphadenopathy appriciated.  Symmetrical Chest wall movement, Good air movement bilaterally, few rales RRR,No Gallops,Rubs or new Murmurs, No Parasternal Heave +ve B.Sounds, Abd Soft, No tenderness, No organomegaly appriciated, No rebound - guarding or rigidity. No Cyanosis, Clubbing or edema, No new Rash or bruise       Data Review:    CBC  Recent Labs Lab 12/13/15 1552 12/14/15 0240 12/15/15 0907  WBC 11.3* 8.7 10.7*  HGB 11.7* 11.0* 12.3*  HCT 36.5* 34.0* 37.1*  PLT 279 272 325  MCV 86.1 86.3 86.9  MCH 27.6 27.9 28.8  MCHC 32.1 32.4 33.2  RDW 14.6 14.6 14.8  LYMPHSABS 1.5  --   --   MONOABS 0.9  --   --   EOSABS 0.1  --   --   BASOSABS 0.0  --   --     Chemistries   Recent Labs Lab 12/13/15 1552 12/14/15 0240 12/15/15 0250 12/15/15 0907  NA 141 141 139 140  K 3.9 3.1* 3.8 3.7  CL 107 106 104 104  CO2 22 22 23 22   GLUCOSE 104* 82 89 172*  BUN 11 11 17 15   CREATININE 1.16 1.15 1.33* 1.55*  CALCIUM 9.1 8.8* 8.8* 9.4  MG  --   --  1.8  --   AST 19  --   --   --   ALT 22  --   --   --   ALKPHOS 54  --   --   --   BILITOT 0.9  --   --   --    ------------------------------------------------------------------------------------------------------------------  Recent Labs  12/14/15 0240  CHOL 159  HDL 28*  LDLCALC 120*  TRIG 56  CHOLHDL 5.7    No results found for: HGBA1C ------------------------------------------------------------------------------------------------------------------ No results for input(s): TSH, T4TOTAL, T3FREE, THYROIDAB in the last 72 hours.  Invalid input(s): FREET3 ------------------------------------------------------------------------------------------------------------------ No results for input(s): VITAMINB12, FOLATE, FERRITIN, TIBC, IRON, RETICCTPCT in the last 72 hours.  Coagulation  profile  Recent Labs Lab 12/13/15 2100  INR 1.16    No results for input(s): DDIMER in the last 72 hours.  Cardiac Enzymes  Recent Labs Lab 12/14/15 0240 12/14/15 0842 12/15/15 0907  TROPONINI 0.09* 0.09* 0.09*   ------------------------------------------------------------------------------------------------------------------    Component Value Date/Time   BNP 1431.1* 12/13/2015 1553    Inpatient Medications  Scheduled Meds: . amiodarone      . amLODipine  10 mg Oral Daily  . aspirin  324 mg Oral Once  . aspirin  325 mg Oral Daily  . atorvastatin  40 mg Oral Daily  . carvedilol  3.125 mg Oral BID WC  . clopidogrel  75 mg Oral Daily  . ezetimibe  10 mg Oral Daily  . ferrous sulfate  325 mg Oral TID WC  . folic acid  1 mg Oral Daily  . furosemide  40 mg Intravenous Q12H  . heparin subcutaneous  5,000 Units Subcutaneous Q8H  . latanoprost  1 drop Both Eyes QHS  . lisinopril  2.5 mg Oral Daily  . potassium chloride  20 mEq Oral Once  . ranolazine  500 mg Oral BID  . timolol  1 drop Both  Eyes BID  . vitamin C  500 mg Oral BID   Continuous Infusions: . amiodarone Stopped (12/15/15 0925)   Followed by  . amiodarone     PRN Meds:.acetaminophen, linaclotide, morphine injection, nitroGLYCERIN, ondansetron (ZOFRAN) IV, zolpidem  Micro Results No results found for this or any previous visit (from the past 240 hour(s)).  Radiology Reports Dg Chest 2 View  12/13/2015  CLINICAL DATA:  Worsening shortness breath and hypoxia. Recent pneumonia, GI bleeding, myocardial infarction. EXAM: CHEST  2 VIEW COMPARISON:  None. FINDINGS: The heart size and mediastinal contours are within normal limits. Pulmonary hyperinflation and coarsening of interstitial lung markings is consistent with COPD. No evidence of pulmonary consolidation or frank pulmonary edema. No evidence of pneumothorax or pleural effusion. Prior CABG noted. IMPRESSION: COPD.  No acute findings. Electronically Signed    By: Myles Rosenthal M.D.   On: 12/13/2015 16:12   Dg Chest Port 1 View  12/15/2015  CLINICAL DATA:  Chest pain EXAM: PORTABLE CHEST 1 VIEW COMPARISON:  12/13/2015 FINDINGS: There is hyperinflation of the lungs compatible with COPD. Mild peribronchial thickening. Heart is upper limits normal in size. No confluent opacities or effusions. No acute bony abnormality. IMPRESSION: COPD/bronchitic changes. Electronically Signed   By: Charlett Nose M.D.   On: 12/15/2015 10:20    Time Spent in minutes  30   SINGH,PRASHANT K M.D on 12/15/2015 at 11:20 AM  Between 7am to 7pm - Pager - [phone]  After 7pm go to [email or web address] - password [facility]  [facility] -  Office  [phone]

## 2015-12-15 NOTE — Progress Notes (Addendum)
MD notified that pt had runs of VT with hot flashes. Called by CCMD approximately 30 minutes later that patient was in VTach with HR sustained at 207. Staff entered room and found patient unconscious. Patient wasn't breathing and no pulse palpated. CPR started and lasted 2 minutes before ROSC.  Oxygen continued. Patient now alert and oriented. NS bolus and amio gtt started. Report called to 2H.

## 2015-12-15 NOTE — Progress Notes (Signed)
Echocardiogram 2D Echocardiogram has been performed.  Andre Wilson, Andre Wilson 12/15/2015, 10:55 AM

## 2015-12-15 NOTE — Progress Notes (Signed)
Initial Nutrition Assessment  DOCUMENTATION CODES:   Non-severe (moderate) malnutrition in context of chronic illness  INTERVENTION:  Provide Ensure Enlive po BID, each supplement provides 350 kcal and 20 grams of protein Provide Multivitamin with minerals daily Encourage PO intake  NUTRITION DIAGNOSIS:   Malnutrition related to chronic illness as evidenced by moderate depletions of muscle mass, percent weight loss.   GOAL:   Patient will meet greater than or equal to 90% of their needs   MONITOR:   PO intake, Supplement acceptance, Labs, Weight trends, I & O's, Skin  REASON FOR ASSESSMENT:   Consult Assessment of nutrition requirement/status  ASSESSMENT:   80 y.o. male with medical history significant of hypertension, hyperlipidemia, GI bleeding, CAD, myocardial infarction, S/P of CABG 1987, GI bleeding, who presents with shortness of breath and bilateral ankle edema.  Pt states that he has lost from his usual weight of 170 lbs to 143 lbs in the past month and a half. He reports [facility] at which time he was not eating well. From there he went to a rehab facility where he didn't like the food and continued to lose weight. Since admission, pt has been eating 100% of most meals. He reports having a good appetite and time of visit; likes the food here so far. Reports plan to have surgery on Thursday. RD emphasized the importance of nutrition, especially before surgery. He likes Ensure and is agreeable to drinking it twice daily. RD provided Ensure coupons and encouraged pt to continue intake for at least one months after discharge. Based one pt's report he has lost 16% of his body weight in less than 2 months. He has moderate muscle wasting and mild to moderate fat wasting per nutrition-focused physical exam.   Labs: glucose ranging 82 to 172 mg/dL, low hemoglobin, low HDL and elevated LDL  Diet Order:  Diet Heart Room service appropriate?:  Yes; Fluid consistency:: Thin; Fluid restriction:: 1200 mL Fluid  Skin:  Reviewed, no issues  Last BM:  4/23  Height:   Ht Readings from Last 1 Encounters:  12/13/15 5' 9.5" (1.765 m)    Weight:   Wt Readings from Last 1 Encounters:  12/15/15 143 lb 14.4 oz (65.273 kg)    Ideal Body Weight:  74.1 kg  BMI:  Body mass index is 20.95 kg/(m^2).  Estimated Nutritional Needs:   Kcal:  1900-2100  Protein:  80-90 grams  Fluid:  1.2 L restriction per MD  EDUCATION NEEDS:   No education needs identified at this time  Dorothea Ogleeanne Latrena Benegas RD, LDN Inpatient Clinical Dietitian Pager: [phone] After Hours Pager: [phone]

## 2015-12-16 ENCOUNTER — Other Ambulatory Visit (HOSPITAL_COMMUNITY): Payer: Medicare Other

## 2015-12-16 DIAGNOSIS — I5041 Acute combined systolic (congestive) and diastolic (congestive) heart failure: Secondary | ICD-10-CM

## 2015-12-16 DIAGNOSIS — I350 Nonrheumatic aortic (valve) stenosis: Secondary | ICD-10-CM

## 2015-12-16 DIAGNOSIS — I10 Essential (primary) hypertension: Secondary | ICD-10-CM | POA: Insufficient documentation

## 2015-12-16 DIAGNOSIS — I255 Ischemic cardiomyopathy: Secondary | ICD-10-CM | POA: Diagnosis present

## 2015-12-16 DIAGNOSIS — E440 Moderate protein-calorie malnutrition: Secondary | ICD-10-CM

## 2015-12-16 DIAGNOSIS — I080 Rheumatic disorders of both mitral and aortic valves: Secondary | ICD-10-CM

## 2015-12-16 DIAGNOSIS — I25710 Atherosclerosis of autologous vein coronary artery bypass graft(s) with unstable angina pectoris: Secondary | ICD-10-CM

## 2015-12-16 LAB — BASIC METABOLIC PANEL
ANION GAP: 11 (ref 5–15)
BUN: 15 mg/dL (ref 6–20)
CALCIUM: 8.9 mg/dL (ref 8.9–10.3)
CHLORIDE: 104 mmol/L (ref 101–111)
CO2: 24 mmol/L (ref 22–32)
Creatinine, Ser: 1.25 mg/dL — ABNORMAL HIGH (ref 0.61–1.24)
GFR calc non Af Amer: 54 mL/min — ABNORMAL LOW (ref >60)
GLUCOSE: 115 mg/dL — AB (ref 65–99)
POTASSIUM: 3.6 mmol/L (ref 3.5–5.1)
Sodium: 139 mmol/L (ref 135–145)

## 2015-12-16 LAB — MAGNESIUM: Magnesium: 1.9 mg/dL (ref 1.7–2.4)

## 2015-12-16 LAB — HEPATIC FUNCTION PANEL
ALBUMIN: 2.7 g/dL — AB (ref 3.5–5.0)
ALK PHOS: 50 U/L (ref 38–126)
ALT: 41 U/L (ref 17–63)
AST: 25 U/L (ref 15–41)
BILIRUBIN TOTAL: 0.8 mg/dL (ref 0.3–1.2)
Total Protein: 5.9 g/dL — ABNORMAL LOW (ref 6.5–8.1)

## 2015-12-16 LAB — TSH: TSH: 2.521 u[IU]/mL (ref 0.350–4.500)

## 2015-12-16 MED ORDER — SODIUM CHLORIDE 0.9 % IV SOLN: 250.0000 mL | INTRAVENOUS | Status: DC | PRN

## 2015-12-16 MED ORDER — SODIUM CHLORIDE 0.9% FLUSH: 3.0000 mL | INTRAVENOUS | Status: DC | PRN

## 2015-12-16 MED ORDER — SODIUM CHLORIDE 0.9 % IV SOLN
INTRAVENOUS | Status: DC
  Administered 2015-12-17: 08:00:00 via INTRAVENOUS

## 2015-12-16 MED ORDER — POTASSIUM CHLORIDE CRYS ER 20 MEQ PO TBCR
20.0000 meq | EXTENDED_RELEASE_TABLET | Freq: Two times a day (BID) | ORAL | Status: DC
  Administered 2015-12-16 – 2015-12-18 (×6): 20 meq via ORAL
  Filled 2015-12-16 (×7): qty 1

## 2015-12-16 MED ORDER — MAGNESIUM OXIDE 400 (241.3 MG) MG PO TABS
800.0000 mg | ORAL_TABLET | Freq: Once | ORAL | Status: AC
  Administered 2015-12-16: 800 mg via ORAL
  Filled 2015-12-16: qty 2

## 2015-12-16 MED ORDER — POTASSIUM CHLORIDE CRYS ER 20 MEQ PO TBCR: 40.0000 meq | EXTENDED_RELEASE_TABLET | Freq: Once | ORAL | Status: DC

## 2015-12-16 MED ORDER — ASPIRIN 81 MG PO CHEW
81.0000 mg | CHEWABLE_TABLET | ORAL | Status: AC
  Administered 2015-12-17: 81 mg via ORAL
  Filled 2015-12-16: qty 1

## 2015-12-16 MED ORDER — SODIUM CHLORIDE 0.9% FLUSH
3.0000 mL | Freq: Two times a day (BID) | INTRAVENOUS | Status: DC
  Administered 2015-12-16 – 2015-12-17 (×2): 3 mL via INTRAVENOUS

## 2015-12-16 MED FILL — Medication: Qty: 1 | Status: AC

## 2015-12-16 NOTE — Progress Notes (Signed)
PT Cancellation Note  Patient Details Name: Milderd Meagerelson Mcphearson MRN: [id number] DOB: 03/19/1936   Cancelled Treatment:    Reason Eval/Treat Not Completed: Medical issues which prohibited therapy. Events of 4/24 noted. Patient currently with SaO2 fluctuating 87-91% at rest. Spoke with RN and agree will defer PT until after ICD procedure (hopeful tomorrow per RN). Will resume PT/incr activity when appropriate.   Dominika Losey 12/16/2015, 11:04 AM  Pager [phone]

## 2015-12-16 NOTE — Progress Notes (Signed)
Patient Name: Andre Wilson Date of Encounter: 12/16/2015  Hospital Problem List     Principal Problem:   Acute combined systolic and diastolic heart failure (HCC) - unsure chronicity Active Problems:   Coronary artery disease involving native heart with angina pectoris (HCC)   Sustained VT (ventricular tachycardia) (HCC)   Cardiac arrest (HCC) secondary to sustained V tach requiring CPR 12/15/15   Acute on chronic congestive heart failure (HCC)   Coronary artery disease involving autologous vein bypass graft   Moderate to severe aortic stenosis   Mitral regurgitation and aortic stenosis   Cardiomyopathy, ischemic   GI bleed   Hypertension   Hypercholesteremia   Protein-calorie malnutrition, moderate (HCC)   Elevated troponin    Subjective   He actually feels pretty well today, he notes that that is a little bit dizzy when he stands up, and noted his blood pressure was low below. He denies significant dyspnea although he is still wearing the nasal cannula oxygen. He is not done much in the way of any activity. No further ventricular tachycardia noted on telemetry. Echocardiogram from yesterday reviewed.  Inpatient Medications    . aspirin  325 mg Oral Daily  . atorvastatin  40 mg Oral Daily  . carvedilol  3.125 mg Oral BID WC  . clopidogrel  75 mg Oral Daily  . ezetimibe  10 mg Oral Daily  . feeding supplement (ENSURE ENLIVE)  237 mL Oral BID PC  . ferrous sulfate  325 mg Oral TID WC  . folic acid  1 mg Oral Daily  . furosemide  40 mg Oral BID  . heparin subcutaneous  5,000 Units Subcutaneous Q8H  . latanoprost  1 drop Both Eyes QHS  . lisinopril  2.5 mg Oral Daily  . multivitamin with minerals  1 tablet Oral Daily  . potassium chloride  20 mEq Oral BID  . ranolazine  500 mg Oral BID  . timolol  1 drop Both Eyes BID  . vitamin C  500 mg Oral BID    Vital Signs    Filed Vitals:   12/16/15 1547 12/16/15 1600 12/16/15 1700 12/16/15 1800  BP:  95/71 107/71 98/68    Pulse:  84 88 90  Temp: 97.8 F (36.6 C)     TempSrc: Oral     Resp:  17 19 25   Height:      Weight:      SpO2:  96% 96% 100%    Intake/Output Summary (Last 24 hours) at 12/16/15 1909 Last data filed at 12/16/15 1800  Gross per 24 hour  Intake 1670.7 ml  Output   1170 ml  Net  500.7 ml   Filed Weights   12/15/15 0357 12/15/15 1000 12/16/15 0421  Weight: 143 lb 14.4 oz (65.273 kg) 143 lb 4.8 oz (65 kg) 147 lb 11.3 oz (67 kg)    Physical Exam    General: Pleasant, NAD. Neuro: Alert and oriented X 3. Moves all extremities spontaneously. Psych: Normal affect. HEENT:  [place]/AT, EOMI.   Neck: Supple without bruits or JVD. - radiated AS murmr Lungs:  Resp regular and unlabored, CTA. Heart: RRR, Nl S1 & S2.  No obvious gallops or rubs, high pitched/squeaking 1-2/6 SEM at RUSB as well as low pitch, blowing HSM at left sternal border. no s3, s4, or murmurs. Abdomen: Soft, non-tender, non-distended, BS + x 4.  Extremities: No clubbing, cyanosis or edema. Radials 2+ and equal bilaterally. Significantly reduced lower extremity pulses.  Labs  CBC  Recent Labs  12/14/15 0240 12/15/15 0907  WBC 8.7 10.7*  HGB 11.0* 12.3*  HCT 34.0* 37.1*  MCV 86.3 86.9  PLT 272 325   Basic Metabolic Panel  Recent Labs  12/15/15 1514 12/16/15 0511  NA 135 139  K 3.9 3.6  CL 97* 104  CO2 24 24  GLUCOSE 370* 115*  BUN 15 15  CREATININE 1.39* 1.25*  CALCIUM 8.7* 8.9  MG 1.9 1.9   Liver Function Tests  Recent Labs  12/15/15 1514 12/16/15 0511  AST 43* 25  ALT 60 41  ALKPHOS 52 50  BILITOT 0.7 0.8  PROT 6.5 5.9*  ALBUMIN 3.0* 2.7*   No results for input(s): LIPASE, AMYLASE in the last 72 hours. Cardiac Enzymes  Recent Labs  12/15/15 0907 12/15/15 1514 12/15/15 2112  TROPONINI 0.09* 0.08* 0.08*   BNP Invalid input(s): POCBNP D-Dimer No results for input(s): DDIMER in the last 72 hours. Hemoglobin A1C  Recent Labs  12/14/15 0240  HGBA1C 5.8*   Fasting Lipid  Panel  Recent Labs  12/14/15 0240  CHOL 159  HDL 28*  LDLCALC 120*  TRIG 56  CHOLHDL 5.7   Thyroid Function Tests  Recent Labs  12/16/15 0511  TSH 2.521    Telemetry    NSR with PVCs, No VT.  Rate 80-105   ECG    No EKG today    Cardiology   Echo 4/24: Severely reduced EF, 25-30% with diffuse HK. AK of inferolateral wall grade 3 DD (restrictive physiology) - with elevated LVEDP. (Moderate-) Severe aortic stenosis (fusion of right and left cusps, calculation does not suggest severe stenosis). Moderate to severe MR. Moderate RV dysfunction with mildly increased PA Pressures.  Radiology    Dg Chest 2 View  12/13/2015  CLINICAL DATA:  Worsening shortness breath and hypoxia. Recent pneumonia, GI bleeding, myocardial infarction. EXAM: CHEST  2 VIEW COMPARISON:  None. FINDINGS: The heart size and mediastinal contours are within normal limits. Pulmonary hyperinflation and coarsening of interstitial lung markings is consistent with COPD. No evidence of pulmonary consolidation or frank pulmonary edema. No evidence of pneumothorax or pleural effusion. Prior CABG noted. IMPRESSION: COPD.  No acute findings. Electronically Signed   By: Myles Rosenthal M.D.   On: 12/13/2015 16:12   Dg Chest Port 1 View  12/15/2015  CLINICAL DATA:  Chest pain EXAM: PORTABLE CHEST 1 VIEW COMPARISON:  12/13/2015 FINDINGS: There is hyperinflation of the lungs compatible with COPD. Mild peribronchial thickening. Heart is upper limits normal in size. No confluent opacities or effusions. No acute bony abnormality. IMPRESSION: COPD/bronchitic changes. Electronically Signed   By: Charlett Nose M.D.   On: 12/15/2015 10:20    Assessment & Plan    Principal Problem:   Acute (? On chronic) combined systolic and diastolic heart failure (HCC) - unsure chronicity  Overall, clinically appears to diurese quite well. No significant PND or orthopnea.  Despite this, filling pressures were very high on echo - he may still  have significant volume overload that we are not able to see.  For now I would continue with the oral Lasix  Active Problems:   Coronary artery disease involving native heart with angina pectoris (HCC) /Coronary artery disease involving autologous vein bypass graft  - it would appear that his only remaining conduit is the LIMA graft. Most likely this is still patent. Question would be is there is any downstream disease in the native vessel/LIMA graft.  He remains on aspirin, statin as well  as Plavix plus low-dose carvedilol and lisinopril. Blood pressure is borderline. Unable to further titrate.    Sustained VT (ventricular tachycardia) (HCC) --  Cardiac arrest (HCC) secondary to sustained V tach requiring CPR 12/15/15  On amiodarone. No further events.   Plan is to confirm no other cause and then likely ICD placement on Thursday.        Mitral regurgitation and aortic stenosis (Moderate to severe aortic stenosis) - prior echo suggested moderate aortic stenosis. This is really difficult to tell with low ejection fraction, but visually the valve does seem to be moving at least in the non-coronary cusp.  Plan for now would be right left heart catheterization 2 give full evaluation of cardiac function as well as potential for aortic stenosis.      Cardiomyopathy, ischemic -- by cardiac catheterization the past he has a patent LIMA but other vein grafts and native circles. -- He has severe native coronary disease. I'm not sure what it is that happened between November of last year and the present time to have reduced his ejection fraction by half. One concerning possibility is that this could be valvular related. At this point I think potentially the best option for evaluation of the valve, residual cardiac output and to further define treatment course, I think we need to consider right and left heart catheterization tomorrow. This will allow [place] to confirm or deny the presence of a patent LIMA. We  can also then get a better assessment of his volume status to determine how much she needs diuresis in the future.     GI bleed - from March 2017 this led to mild troponin elevation. I don't think this was a inciting event for this drop in ejection fraction.   Hypertension - not truly an issue. He is on low-dose ACE inhibitor and carvedilol.   Hypercholesteremia   Protein-calorie malnutrition, moderate (HCC) - defer to primary physician team   Elevated troponin - likely demand ischemia from reduced EF.   Basically, we'll plan for right left heart catheterization 2 get an assessment of his voiding status as well as the significance of the valvular disease. Providing the aortic valve is not significant, would likely continue with management of ischemic cardiomyopathy. If, however the valvular disease is significant enough to potentially consider aVR, would need to definitely have echo images from his prior cardiologist. \ If the aortic valve indeed does show critical stenosis, would be to determine the next step collectively of what to do going forward  Signed, Marykay Lex, M.D., M.S. Interventional Cardiologist   Pager # [phone] Phone # [phone] [address] [place], [place] [address]

## 2015-12-16 NOTE — Progress Notes (Signed)
PROGRESS NOTE                                                                                                                                                                                                             Patient Demographics:    Andre Wilson, is a 80 y.o. male, DOB - 1935/12/02, RUE:[id number]  Admit date - 12/13/2015   Admitting Physician Lorretta Harp, MD  Outpatient Primary MD for the patient in [place] LOS - 3  Outpatient Specialists: Cardiologist at [facility] and in [facility] Complaint  Patient presents with  . Shortness of Breath       Brief Narrative -   Andre Wilson is a 81 y.o. male with medical history significant of hypertension, hyperlipidemia, GI bleeding, CAD, myocardial infarction, S/P of CABG 1987, GI bleeding, who presents with shortness of breath and bilateral ankle edema.  Patient reports that he has been having shortness of breath in the past several days, which has been progressively getting worse. He also noticed bilateral ankle fluid retention. He has mild dry cough, but no chest pain, fever or chills. Patient does not have nausea, vomiting, abdominal pain, diarrhea or symptoms of UTI. No acute laterally weakness. Workup showed CHF and was admitted for it.  After being admitted to the hospital he was being treated for CHF, his echocardiogram was pending, he was close to his baseline on 12/15/2015 morning, however around 8:45 AM on 12/15/2015 patient went into a prolonged V. tach rhythm and became briefly unresponsive, he never lost his pulse, he was given IV magnesium, placed on IV amiodarone, stat echogram and cardiology consult was obtained and he was transferred to stepdown/ICU status. Plan is ICD on 12-17-15.    Subjective:    [name] today has, No headache, No chest pain, No abdominal pain - No Nausea, No new weakness tingling or numbness, No Cough - Improved  shortness of breath. Feels fine.   Assessment  & Plan :     1. Acute on chronic Combined systolic and diastolic CHF last EF more than one year ago was 55% due to ischemic cardiomyopathy EF now 25% with New sustained monomorphic V. tach .   He was admitted for CHF was much improved after IV Lasix, he was on ACE inhibitor along with Coreg and aspirin, he was feeling much better  on day 2 and was awaiting his echocardiogram and was eager to go home. CHF resolved now on PO Lasix.  However around 8:45 AM on 12/15/2015 he had a prolonged run of sustained monomorphic V. tach and became briefly unresponsive, CPR < 1 min, had pulse immediately no shocks. Cards and EP following.   2. CAD. Troponin elevated in non-ACS pattern, chest pain-free, EKG nonacute, mildly elevated troponin due to CHF with demand ischemia, completely chest pain-free, continue aspirin, Coreg, statin, Zetia,  Plavix and Ranexa for secondary prevention. Notes from [facility] cardiology evaluation in December 2016 were reviewed where all his native coronaries were majorly affected, his CABG graft seemed to be patent. Will defer further management to cardiology. May need another Cath ?  3.  V. tach with new drop in EF from 50% to 25% and global hypokinesis. Transferred to stepdown/ICU, IV magnesium given on 12/15/2015, electrolytes stable, on IV amiodarone, continue Coreg and ACE inhibitor.   Stat repeat echo was obtained showing EF has now dropped from 50% to 25% with global hypokinesis and likely moderate AS and severe MR. We'll defer further management to cardiology & EP. From the CHF standpoint he seems to have improved and was transitioned to oral Lasix. May need ICD +/- Cath.   4. Moderate to severe MR and AS. Defer to cardiology.   5.  Dyslipidemia. On statin and Zetia..  6. Moderate PCM. Nutritionist consult, outpatient PCP follow-up for weight loss for age-appropriate workup.  7. Essential hypertension. Stopped Norvasc, currently  on Coreg, ACE inhibitor will monitor blood pressure.  8. H/O Diverticular bleed - stable.    Code Status : Full  Family Communication  : Updated daughter on the phone on 12/15/2015  Disposition Plan  : Home 1-2 days  Barriers For Discharge : CHF and V. tach treatment  Consults  :   Cardiology, EP  Procedures  :   TTE - EF is dropped from 50% one year ago to 25% with global hypokinesis and at least moderate to severe AS, severe MR. Some diastolic dysfunction as well.  Global hypokinesis with akinesis of the inferolateral wall; overall severely reduced LV function; restrictive filling with elevated LV filling pressure; moderate LVE; mild LAE; moderately reduced RV function; calcified aortic valve with fusion of right and left cusps; severe AS by continuity equation (AVA 0.75 cm2);  however this appears likely to be overestimated as mean gradient 10 mmHg and dimensionless index 0.39 not supportive of severe AS; mild AI; moderate to severe MR; trace TR with mildly elevated  pulmonary pressure.    DVT Prophylaxis  :  Heparin   Lab Results  Component Value Date   PLT 325 12/15/2015    Antibiotics  :     Anti-infectives    None        Objective:   Filed Vitals:   12/16/15 0400 12/16/15 0421 12/16/15 0500 12/16/15 0600  BP: 106/62  100/66 97/68  Pulse: 85  86 91  Temp: 97.9 F (36.6 C)     TempSrc: Oral     Resp: 0  0 20  Height:      Weight:  67 kg (147 lb 11.3 oz)    SpO2: 96%  96% 94%    Wt Readings from Last 3 Encounters:  12/16/15 67 kg (147 lb 11.3 oz)     Intake/Output Summary (Last 24 hours) at 12/16/15 0921 Last data filed at 12/16/15 0600  Gross per 24 hour  Intake 898.13 ml  Output  495 ml  Net 403.13 ml     Physical Exam  Awake Alert, Oriented X 3, No new F.N deficits, Normal affect [place].AT,PERRAL Supple Neck,No JVD, No cervical lymphadenopathy appriciated.  Symmetrical Chest wall movement, Good air movement bilaterally, few rales RRR,No  Gallops,Rubs or new Murmurs, No Parasternal Heave +ve B.Sounds, Abd Soft, No tenderness, No organomegaly appriciated, No rebound - guarding or rigidity. No Cyanosis, Clubbing or edema, No new Rash or bruise       Data Review:    CBC  Recent Labs Lab 12/13/15 1552 12/14/15 0240 12/15/15 0907  WBC 11.3* 8.7 10.7*  HGB 11.7* 11.0* 12.3*  HCT 36.5* 34.0* 37.1*  PLT 279 272 325  MCV 86.1 86.3 86.9  MCH 27.6 27.9 28.8  MCHC 32.1 32.4 33.2  RDW 14.6 14.6 14.8  LYMPHSABS 1.5  --   --   MONOABS 0.9  --   --   EOSABS 0.1  --   --   BASOSABS 0.0  --   --     Chemistries   Recent Labs Lab 12/13/15 1552 12/14/15 0240 12/15/15 0250 12/15/15 0907 12/15/15 1514 12/16/15 0511  NA 141 141 139 140 135 139  K 3.9 3.1* 3.8 3.7 3.9 3.6  CL 107 106 104 104 97* 104  CO2 GLUCOSE 104* 82 89 172* 370* 115*  BUN CREATININE 1.16 1.15 1.33* 1.55* 1.39* 1.25*  CALCIUM 9.1 8.8* 8.8* 9.4 8.7* 8.9  MG  --   --  1.8  --  1.9 1.9  AST 19  --   --   --  43* 25  ALT 22  --   --   --  60 41  ALKPHOS 54  --   --   --  52 50  BILITOT 0.9  --   --   --  0.7 0.8   ------------------------------------------------------------------------------------------------------------------  Recent Labs  12/14/15 0240  CHOL 159  HDL 28*  LDLCALC 120*  TRIG 56  CHOLHDL 5.7    Lab Results  Component Value Date   HGBA1C 5.8* 12/14/2015   ------------------------------------------------------------------------------------------------------------------  Recent Labs  12/16/15 0511  TSH 2.521   ------------------------------------------------------------------------------------------------------------------ No results for input(s): VITAMINB12, FOLATE, FERRITIN, TIBC, IRON, RETICCTPCT in the last 72 hours.  Coagulation profile  Recent Labs Lab 12/13/15 2100  INR 1.16    No results for input(s): DDIMER in the last 72 hours.  Cardiac Enzymes  Recent  Labs Lab 12/15/15 0907 12/15/15 1514 12/15/15 2112  TROPONINI 0.09* 0.08* 0.08*   ------------------------------------------------------------------------------------------------------------------    Component Value Date/Time   BNP 1431.1* 12/13/2015 1553    Inpatient Medications  Scheduled Meds: . aspirin  324 mg Oral Once  . aspirin  325 mg Oral Daily  . atorvastatin  40 mg Oral Daily  . carvedilol  3.125 mg Oral BID WC  . clopidogrel  75 mg Oral Daily  . ezetimibe  10 mg Oral Daily  . feeding supplement (ENSURE ENLIVE)  237 mL Oral BID PC  . ferrous sulfate  325 mg Oral TID WC  . folic acid  1 mg Oral Daily  . furosemide  40 mg Oral BID  . heparin subcutaneous  5,000 Units Subcutaneous Q8H  . latanoprost  1 drop Both Eyes QHS  . lisinopril  2.5 mg Oral Daily  . multivitamin with minerals  1 tablet Oral Daily  . potassium chloride  20 mEq Oral BID  . ranolazine  500 mg Oral BID  . timolol  1 drop Both Eyes BID  . vitamin C  500 mg Oral BID   Continuous Infusions: . amiodarone 30 mg/hr (12/15/15 2052)   PRN Meds:.acetaminophen, linaclotide, morphine injection, nitroGLYCERIN, ondansetron (ZOFRAN) IV, zolpidem  Micro Results Recent Results (from the past 240 hour(s))  MRSA PCR Screening     Status: None   Collection Time: 12/15/15  9:51 AM  Result Value Ref Range Status   MRSA by PCR NEGATIVE NEGATIVE Final    Comment:        The GeneXpert MRSA Assay (FDA approved for NASAL specimens only), is one component of a comprehensive MRSA colonization surveillance program. It is not intended to diagnose MRSA infection nor to guide or monitor treatment for MRSA infections.     Radiology Reports Dg Chest 2 View  12/13/2015  CLINICAL DATA:  Worsening shortness breath and hypoxia. Recent pneumonia, GI bleeding, myocardial infarction. EXAM: CHEST  2 VIEW COMPARISON:  None. FINDINGS: The heart size and mediastinal contours are within normal limits. Pulmonary  hyperinflation and coarsening of interstitial lung markings is consistent with COPD. No evidence of pulmonary consolidation or frank pulmonary edema. No evidence of pneumothorax or pleural effusion. Prior CABG noted. IMPRESSION: COPD.  No acute findings. Electronically Signed   By: Myles RosenthalJohn  Stahl M.D.   On: 12/13/2015 16:12   Dg Chest Port 1 View  12/15/2015  CLINICAL DATA:  Chest pain EXAM: PORTABLE CHEST 1 VIEW COMPARISON:  12/13/2015 FINDINGS: There is hyperinflation of the lungs compatible with COPD. Mild peribronchial thickening. Heart is upper limits normal in size. No confluent opacities or effusions. No acute bony abnormality. IMPRESSION: COPD/bronchitic changes. Electronically Signed   By: Charlett NoseKevin  Dover M.D.   On: 12/15/2015 10:20    Time Spent in minutes  30   Joy Haegele K M.D on 12/16/2015 at 9:21 AM  Between 7am to 7pm - Pager - [phone]  After 7pm go to [email or web address] - password [email or web address]  [facility] -  Office  [phone]

## 2015-12-17 ENCOUNTER — Encounter (HOSPITAL_COMMUNITY)
Admission: EM | Disposition: A | Discharge status: Home Health | Payer: Self-pay | Source: Home / Self Care | Attending: Internal Medicine

## 2015-12-17 DIAGNOSIS — I5023 Acute on chronic systolic (congestive) heart failure: Secondary | ICD-10-CM

## 2015-12-17 HISTORY — PX: CARDIAC CATHETERIZATION: SHX172

## 2015-12-17 LAB — CBC
HCT: 37 % — ABNORMAL LOW (ref 39.0–52.0)
HEMATOCRIT: 36 % — AB (ref 39.0–52.0)
HEMOGLOBIN: 12 g/dL — AB (ref 13.0–17.0)
Hemoglobin: 11.7 g/dL — ABNORMAL LOW (ref 13.0–17.0)
MCH: 27.7 pg (ref 26.0–34.0)
MCH: 27.7 pg (ref 26.0–34.0)
MCHC: 32.4 g/dL (ref 30.0–36.0)
MCHC: 32.5 g/dL (ref 30.0–36.0)
MCV: 85.5 fL (ref 78.0–100.0)
MCV: 85.1 fL (ref 78.0–100.0)
PLATELETS: 309 10*3/uL (ref 150–400)
PLATELETS: 293 10*3/uL (ref 150–400)
RBC: 4.33 MIL/uL (ref 4.22–5.81)
RBC: 4.23 MIL/uL (ref 4.22–5.81)
RDW: 14.7 % (ref 11.5–15.5)
RDW: 14.8 % (ref 11.5–15.5)
WBC: 13 10*3/uL — ABNORMAL HIGH (ref 4.0–10.5)
WBC: 11 10*3/uL — AB (ref 4.0–10.5)

## 2015-12-17 LAB — BASIC METABOLIC PANEL
ANION GAP: 11 (ref 5–15)
BUN: 18 mg/dL (ref 6–20)
CALCIUM: 9 mg/dL (ref 8.9–10.3)
CHLORIDE: 102 mmol/L (ref 101–111)
CO2: 24 mmol/L (ref 22–32)
CREATININE: 1.33 mg/dL — AB (ref 0.61–1.24)
GFR calc non Af Amer: 50 mL/min — ABNORMAL LOW (ref >60)
GFR, EST AFRICAN AMERICAN: 58 mL/min — AB (ref >60)
Glucose, Bld: 110 mg/dL — ABNORMAL HIGH (ref 65–99)
Potassium: 4 mmol/L (ref 3.5–5.1)
SODIUM: 137 mmol/L (ref 135–145)

## 2015-12-17 LAB — CREATININE, SERUM
Creatinine, Ser: 1.37 mg/dL — ABNORMAL HIGH (ref 0.61–1.24)
GFR, EST AFRICAN AMERICAN: 56 mL/min — AB (ref >60)
GFR, EST NON AFRICAN AMERICAN: 48 mL/min — AB (ref >60)

## 2015-12-17 LAB — POCT I-STAT 3, VENOUS BLOOD GAS (G3P V)
ACID-BASE EXCESS: 2 mmol/L (ref 0.0–2.0)
Bicarbonate: 26.5 mEq/L — ABNORMAL HIGH (ref 20.0–24.0)
O2 Saturation: 54 %
PCO2 VEN: 38.8 mmHg — AB (ref 45.0–50.0)
PH VEN: 7.441 — AB (ref 7.250–7.300)
PO2 VEN: 27 mmHg — AB (ref 31.0–45.0)
TCO2: 28 mmol/L (ref 0–100)

## 2015-12-17 LAB — POCT ACTIVATED CLOTTING TIME: Activated Clotting Time: 172 seconds

## 2015-12-17 LAB — POCT I-STAT 3, ART BLOOD GAS (G3+)
Acid-Base Excess: 4 mmol/L — ABNORMAL HIGH (ref 0.0–2.0)
Bicarbonate: 27.8 mEq/L — ABNORMAL HIGH (ref 20.0–24.0)
O2 Saturation: 93 %
PH ART: 7.452 — AB (ref 7.350–7.450)
TCO2: 29 mmol/L (ref 0–100)
pCO2 arterial: 39.9 mmHg (ref 35.0–45.0)
pO2, Arterial: 64 mmHg — ABNORMAL LOW (ref 80.0–100.0)

## 2015-12-17 LAB — MAGNESIUM: Magnesium: 1.8 mg/dL (ref 1.7–2.4)

## 2015-12-17 LAB — PROTIME-INR
INR: 1.15 (ref 0.00–1.49)
PROTHROMBIN TIME: 14.9 s (ref 11.6–15.2)

## 2015-12-17 SURGERY — RIGHT/LEFT HEART CATH AND CORONARY/GRAFT ANGIOGRAPHY
Anesthesia: LOCAL

## 2015-12-17 MED ORDER — ASPIRIN EC 81 MG PO TBEC
81.0000 mg | DELAYED_RELEASE_TABLET | Freq: Every day | ORAL | Status: DC
  Administered 2015-12-18 – 2015-12-20 (×3): 81 mg via ORAL
  Filled 2015-12-17 (×3): qty 1

## 2015-12-17 MED ORDER — SODIUM CHLORIDE 0.9 % WEIGHT BASED INFUSION: 1.0000 mL/kg/h | INTRAVENOUS | Status: AC

## 2015-12-17 MED ORDER — SODIUM CHLORIDE 0.9 % IV SOLN: 250.0000 mL | INTRAVENOUS | Status: DC | PRN

## 2015-12-17 MED ORDER — HEPARIN (PORCINE) IN NACL 2-0.9 UNIT/ML-% IJ SOLN
INTRAMUSCULAR | Status: DC | PRN
  Administered 2015-12-17: 1000 mL via INTRA_ARTERIAL

## 2015-12-17 MED ORDER — CEFAZOLIN SODIUM-DEXTROSE 2-4 GM/100ML-% IV SOLN
2.0000 g | INTRAVENOUS | Status: AC
  Administered 2015-12-18: 2 g via INTRAVENOUS
  Filled 2015-12-17: qty 100

## 2015-12-17 MED ORDER — HEPARIN SODIUM (PORCINE) 1000 UNIT/ML IJ SOLN
INTRAMUSCULAR | Status: AC
  Filled 2015-12-17: qty 1

## 2015-12-17 MED ORDER — LIDOCAINE HCL (PF) 1 % IJ SOLN
INTRAMUSCULAR | Status: DC | PRN
  Administered 2015-12-17: 10 mL

## 2015-12-17 MED ORDER — SODIUM CHLORIDE 0.9% FLUSH: 3.0000 mL | INTRAVENOUS | Status: DC | PRN

## 2015-12-17 MED ORDER — HEPARIN SODIUM (PORCINE) 1000 UNIT/ML IJ SOLN
INTRAMUSCULAR | Status: DC | PRN
  Administered 2015-12-17: 2500 [IU] via INTRAVENOUS

## 2015-12-17 MED ORDER — FENTANYL CITRATE (PF) 100 MCG/2ML IJ SOLN
INTRAMUSCULAR | Status: DC | PRN
  Administered 2015-12-17: 50 ug via INTRAVENOUS

## 2015-12-17 MED ORDER — HEPARIN SODIUM (PORCINE) 5000 UNIT/ML IJ SOLN
5000.0000 [IU] | Freq: Three times a day (TID) | INTRAMUSCULAR | Status: DC
  Administered 2015-12-17 – 2015-12-20 (×6): 5000 [IU] via SUBCUTANEOUS
  Filled 2015-12-17 (×7): qty 1

## 2015-12-17 MED ORDER — MIDAZOLAM HCL 2 MG/2ML IJ SOLN
INTRAMUSCULAR | Status: AC
  Filled 2015-12-17: qty 2

## 2015-12-17 MED ORDER — HEPARIN (PORCINE) IN NACL 2-0.9 UNIT/ML-% IJ SOLN
INTRAMUSCULAR | Status: AC
  Filled 2015-12-17: qty 1000

## 2015-12-17 MED ORDER — LIDOCAINE HCL (PF) 1 % IJ SOLN
INTRAMUSCULAR | Status: AC
  Filled 2015-12-17: qty 30

## 2015-12-17 MED ORDER — IOPAMIDOL (ISOVUE-370) INJECTION 76%
INTRAVENOUS | Status: DC | PRN
  Administered 2015-12-17: 85 mL via INTRA_ARTERIAL

## 2015-12-17 MED ORDER — SODIUM CHLORIDE 0.9% FLUSH
3.0000 mL | Freq: Two times a day (BID) | INTRAVENOUS | Status: DC
  Administered 2015-12-17 – 2015-12-19 (×5): 3 mL via INTRAVENOUS

## 2015-12-17 MED ORDER — FENTANYL CITRATE (PF) 100 MCG/2ML IJ SOLN
INTRAMUSCULAR | Status: AC
  Filled 2015-12-17: qty 2

## 2015-12-17 MED ORDER — MIDAZOLAM HCL 2 MG/2ML IJ SOLN
INTRAMUSCULAR | Status: DC | PRN
  Administered 2015-12-17: 1 mg via INTRAVENOUS

## 2015-12-17 MED ORDER — SODIUM CHLORIDE 0.9 % IR SOLN
80.0000 mg | Status: AC
  Administered 2015-12-18: 80 mg
  Filled 2015-12-17: qty 2

## 2015-12-17 MED ORDER — IOPAMIDOL (ISOVUE-370) INJECTION 76%
INTRAVENOUS | Status: AC
  Filled 2015-12-17: qty 100

## 2015-12-17 MED ORDER — OXYCODONE-ACETAMINOPHEN 5-325 MG PO TABS
1.0000 | ORAL_TABLET | ORAL | Status: DC | PRN
  Administered 2015-12-18 (×2): 1 via ORAL
  Administered 2015-12-19: 2 via ORAL
  Filled 2015-12-17 (×2): qty 2

## 2015-12-17 SURGICAL SUPPLY — 15 items
CATH INFINITI 5FR MULTPACK ANG (CATHETERS) ×2 IMPLANT
CATH INFINITI MULTIPACK ANG 4F (CATHETERS) ×2 IMPLANT
CATH SITESEER 5F MULTI A 2 (CATHETERS) ×2 IMPLANT
CATH SWAN GANZ 7F STRAIGHT (CATHETERS) ×2 IMPLANT
KIT HEART LEFT (KITS) ×2 IMPLANT
PACK CARDIAC CATHETERIZATION (CUSTOM PROCEDURE TRAY) ×2 IMPLANT
SHEATH PINNACLE 5F 10CM (SHEATH) ×2 IMPLANT
SHEATH PINNACLE 7F 10CM (SHEATH) ×2 IMPLANT
SYR 8ML ANGIOGRAPH HI-PRES (SYRINGE) ×2 IMPLANT
TRANSDUCER W/STOPCOCK (MISCELLANEOUS) ×2 IMPLANT
TUBING CIL FLEX 10 FLL-RA (TUBING) ×2 IMPLANT
WIRE EMERALD 3MM-J .025X260CM (WIRE) ×2 IMPLANT
WIRE EMERALD 3MM-J .035X150CM (WIRE) ×2 IMPLANT
WIRE EMERALD ST .035X150CM (WIRE) ×2 IMPLANT
WIRE HI TORQ VERSACORE-J 145CM (WIRE) ×2 IMPLANT

## 2015-12-17 NOTE — Interval H&P Note (Signed)
Cath Lab Visit (complete for each Cath Lab visit)  Clinical Evaluation Leading to the Procedure:   ACS: No.  Non-ACS:    Anginal Classification: CCS III  Anti-ischemic medical therapy: Minimal Therapy (1 class of medications)  Non-Invasive Test Results: No non-invasive testing performed  Prior CABG: Previous CABG      History and Physical Interval Note:  12/17/2015 2:20 PM  Delton SeeNelson Drennon  has presented today for surgery, with the diagnosis of c/p  The various methods of treatment have been discussed with the patient and family. After consideration of risks, benefits and other options for treatment, the patient has consented to  Procedure(s): Right/Left Heart Cath and Coronary/Graft Angiography (N/A) as a surgical intervention .  The patient's history has been reviewed, patient examined, no change in status, stable for surgery.  I have reviewed the patient's chart and labs.  Questions were answered to the patient's satisfaction.     Lesleigh NoeSMITH III,Chandani Rogowski W

## 2015-12-17 NOTE — Progress Notes (Addendum)
 Patient Name: Andre Wilson Date of Encounter: 12/17/2015  Hospital Problem List     Principal Problem:   Acute combined systolic and diastolic heart failure (HCC) - unsure chronicity Active Problems:   Coronary artery disease involving native heart with angina pectoris (HCC)   Sustained VT (ventricular tachycardia) (HCC)   Cardiac arrest (HCC) secondary to sustained V tach requiring CPR 12/15/15   Acute on chronic congestive heart failure (HCC)   Coronary artery disease involving autologous vein bypass graft   Moderate to severe aortic stenosis   Mitral regurgitation and aortic stenosis   Cardiomyopathy, ischemic   GI bleed   Hypertension   Hypercholesteremia   Protein-calorie malnutrition, moderate (HCC)   Elevated troponin   Essential hypertension    Subjective   Feels pretty good today. No chest pain or significant dyspnea. He doesn't notice any irregularities in his heartbeat.  Inpatient Medications    . aspirin  325 mg Oral Daily  . atorvastatin  40 mg Oral Daily  . carvedilol  3.125 mg Oral BID WC  . clopidogrel  75 mg Oral Daily  . ezetimibe  10 mg Oral Daily  . feeding supplement (ENSURE ENLIVE)  237 mL Oral BID PC  . ferrous sulfate  325 mg Oral TID WC  . folic acid  1 mg Oral Daily  . furosemide  40 mg Oral BID  . heparin subcutaneous  5,000 Units Subcutaneous Q8H  . latanoprost  1 drop Both Eyes QHS  . lisinopril  2.5 mg Oral Daily  . multivitamin with minerals  1 tablet Oral Daily  . potassium chloride  20 mEq Oral BID  . ranolazine  500 mg Oral BID  . sodium chloride flush  3 mL Intravenous Q12H  . timolol  1 drop Both Eyes BID  . vitamin C  500 mg Oral BID    Vital Signs    Filed Vitals:   12/17/15 0500 12/17/15 0600 12/17/15 0700 12/17/15 0800  BP: 109/62 114/68 102/60 100/65  Pulse: 90 93 87 88  Temp:    98.6 F (37 C)  TempSrc:      Resp: 18 30 18 20  Height:      Weight:    150 lb 12.7 oz (68.4 kg)  SpO2: 88% 92% 92% 98%     Intake/Output Summary (Last 24 hours) at 12/17/15 1116 Last data filed at 12/17/15 0800  Gross per 24 hour  Intake 1430.7 ml  Output    955 ml  Net  475.7 ml   Filed Weights   12/16/15 0421 12/17/15 0234 12/17/15 0800  Weight: 147 lb 11.3 oz (67 kg) 150 lb 12.7 oz (68.4 kg) 150 lb 12.7 oz (68.4 kg)    Physical Exam    General: Pleasant, NAD. Neuro: Alert and oriented X 3. Moves all extremities spontaneously. Psych: Normal affect. HEENT:  [place]/AT, EOMI.   Neck: Supple without bruits; minimal JVD. - radiated AS murmr Lungs:  Resp regular and unlabored, CTA. Heart: RRR, Nl S1 & S2.  No obvious gallops or rubs, high pitched/squeaking 1-2/6 SEM at RUSB as well as low pitch, blowing 3/6 HSM at left sternal border. no s3, s4, or murmurs. Abdomen: Soft, non-tender, non-distended, BS + x 4.  Extremities: No clubbing, cyanosis or edema. Radials 2+ and equal bilaterally. Significantly reduced lower extremity pulses.  Labs    CBC  Recent Labs  12/15/15 0907 12/17/15 0240  WBC 10.7* 13.0*  HGB 12.3* 12.0*  HCT 37.1* 37.0*  MCV 86.9   85.5  PLT 325 309   Basic Metabolic Panel  Recent Labs  12/16/15 0511 12/17/15 0240  NA 139 137  K 3.6 4.0  CL 104 102  CO2 24 24  GLUCOSE 115* 110*  BUN 15 18  CREATININE 1.25* 1.33*  CALCIUM 8.9 9.0  MG 1.9 1.8   Liver Function Tests  Recent Labs  12/15/15 1514 12/16/15 0511  AST 43* 25  ALT 60 41  ALKPHOS 52 50  BILITOT 0.7 0.8  PROT 6.5 5.9*  ALBUMIN 3.0* 2.7*   No results for input(s): LIPASE, AMYLASE in the last 72 hours. Cardiac Enzymes  Recent Labs  12/15/15 0907 12/15/15 1514 12/15/15 2112  TROPONINI 0.09* 0.08* 0.08*   BNP Invalid input(s): POCBNP D-Dimer No results for input(s): DDIMER in the last 72 hours. Hemoglobin A1C No results for input(s): HGBA1C in the last 72 hours. Fasting Lipid Panel No results for input(s): CHOL, HDL, LDLCALC, TRIG, CHOLHDL, LDLDIRECT in the last 72 hours. Thyroid Function  Tests  Recent Labs  12/16/15 0511  TSH 2.521    Telemetry    NSR with PVCs, No VT.  Rate 80-105   ECG    No EKG today    Cardiology    Echo 4/24: Severely reduced EF, 25-30% with diffuse HK. AK of inferolateral wall grade 3 DD (restrictive physiology) - with elevated LVEDP. (Moderate-) Severe aortic stenosis (fusion of right and left cusps, calculation does not suggest severe stenosis). Moderate to severe MR. Moderate RV dysfunction with mildly increased PA Pressures.  Radiology   No new studies Dg Chest 2 View  12/13/2015  CLINICAL DATA:  Worsening shortness breath and hypoxia. Recent pneumonia, GI bleeding, myocardial infarction. EXAM: CHEST  2 VIEW COMPARISON:  None. FINDINGS: The heart size and mediastinal contours are within normal limits. Pulmonary hyperinflation and coarsening of interstitial lung markings is consistent with COPD. No evidence of pulmonary consolidation or frank pulmonary edema. No evidence of pneumothorax or pleural effusion. Prior CABG noted. IMPRESSION: COPD.  No acute findings. Electronically Signed   By: John  Stahl M.D.   On: 12/13/2015 16:12   Dg Chest Port 1 View  12/15/2015  CLINICAL DATA:  Chest pain EXAM: PORTABLE CHEST 1 VIEW COMPARISON:  12/13/2015 FINDINGS: There is hyperinflation of the lungs compatible with COPD. Mild peribronchial thickening. Heart is upper limits normal in size. No confluent opacities or effusions. No acute bony abnormality. IMPRESSION: COPD/bronchitic changes. Electronically Signed   By: Kevin  Dover M.D.   On: 12/15/2015 10:20    Assessment & Plan    Principal Problem:   Acute (? On chronic) combined systolic and diastolic heart failure (HCC) - unsure chronicity  Overall, clinically appears to diurese quite well. No significant PND or orthopnea. -- With his severely reduced EF, we are planning a right left heart catheterization in order to better assess his volume status, cardiac output etc. in order to determine  appropriate medicines for outpatient.  For now I would continue with the oral Lasix pending results as he does appear to be euvolemic.  I will arrange for him to be followed up in the heart failure clinic due to the complex nature of his heart failure and valvular disease. He may very well need an outpatient TEE to better assess his valves. Will need close follow-up this is probably better done in the heart failure clinic. I discussed it with Dr. McLean. He'll be looking for the right and left heart cath results.  Active Problems:   Coronary artery   disease involving native heart with angina pectoris (HCC) /Coronary artery disease involving autologous vein bypass graft  - it would appear that his only remaining conduit is the LIMA graft. Most likely this is still patent. Question would be is there is any downstream disease in the native vessel/LIMA graft.  He remains on aspirin, statin as well as Plavix plus low-dose carvedilol and lisinopril. Blood pressure is borderline. Unable to further titrate.  Plan is a right left heart catheterization. I think he only has the LIMA to inject from a coronary standpoint, however be nice to know if there is disease in the LIMA or the distal LAD which could explain the drop in EF. Echocardiographically this appears to be the only area getting perfusion and therefore with normal wall motion.    Sustained VT (ventricular tachycardia) (HCC) --  Cardiac arrest (HCC) secondary to sustained V tach requiring CPR 12/15/15  On amiodarone. No further events. - We'll probably convert to oral amiodarone tomorrow post ICD  Plan is to confirm no other cause and then likely ICD placement on Thursday.        Mitral regurgitation and aortic stenosis (Moderate to severe aortic stenosis) - prior echo suggested moderate aortic stenosis. This is really difficult to tell with low ejection fraction, but visually the valve does seem to be moving at least in the non-coronary  cusp.  Plan for now would be right left heart catheterization to give full evaluation of cardiac function as well as potential for aortic stenosis.      Cardiomyopathy, ischemic -- by cardiac catheterization the past he has a patent LIMA but other vein grafts and native circles. -- He has severe native coronary disease. -- Apparently new drop in EF from November of last year. This is despite having no acute event. Most likely cause is steady progression of disease with now no longer having any RCA or circumflex perfusion. There is also the compounding factor of valvular disease.  Plan is for right left heart catheterization today to exclude any additional CAD. Also to get a better sense of collateral flow    GI bleed - from March 2017 this led to mild troponin elevation. I don't think this was a inciting event for this drop in ejection fraction.   Hypertension - not truly an issue. He is on low-dose ACE inhibitor and carvedilol that cannot be further titrated due to borderline pressures.   Hypercholesteremia - on statin   Protein-calorie malnutrition, moderate (HCC) - defer to primary physician team   Elevated troponin - likely demand ischemia from reduced EF.   Plan: for right left heart catheterization today and likely ICD tomorrow.  -- Would expect a good likely be discharged following his ICD placement as early as Friday. Will arrange for follow-up in the heart failure clinic. -- Further evaluation of his valvular disease can be done in the outpatient setting.  I suspect that he is probably stable to move to a cardiac stepdown unit either in TCU or 3 W. if ICU bed availability requires it   Signed, HARDING, DAVID W, M.D., M.S. Interventional Cardiologist   Pager # [phone] Phone # [phone] [address] [place], [place] [address]   

## 2015-12-17 NOTE — H&P (View-Only) (Signed)
Patient Name: Andre Wilson Date of Encounter: 12/17/2015  Hospital Problem List     Principal Problem:   Acute combined systolic and diastolic heart failure (HCC) - unsure chronicity Active Problems:   Coronary artery disease involving native heart with angina pectoris (HCC)   Sustained VT (ventricular tachycardia) (HCC)   Cardiac arrest (HCC) secondary to sustained V tach requiring CPR 12/15/15   Acute on chronic congestive heart failure (HCC)   Coronary artery disease involving autologous vein bypass graft   Moderate to severe aortic stenosis   Mitral regurgitation and aortic stenosis   Cardiomyopathy, ischemic   GI bleed   Hypertension   Hypercholesteremia   Protein-calorie malnutrition, moderate (HCC)   Elevated troponin   Essential hypertension    Subjective   Feels pretty good today. No chest pain or significant dyspnea. He doesn't notice any irregularities in his heartbeat.  Inpatient Medications    . aspirin  325 mg Oral Daily  . atorvastatin  40 mg Oral Daily  . carvedilol  3.125 mg Oral BID WC  . clopidogrel  75 mg Oral Daily  . ezetimibe  10 mg Oral Daily  . feeding supplement (ENSURE ENLIVE)  237 mL Oral BID PC  . ferrous sulfate  325 mg Oral TID WC  . folic acid  1 mg Oral Daily  . furosemide  40 mg Oral BID  . heparin subcutaneous  5,000 Units Subcutaneous Q8H  . latanoprost  1 drop Both Eyes QHS  . lisinopril  2.5 mg Oral Daily  . multivitamin with minerals  1 tablet Oral Daily  . potassium chloride  20 mEq Oral BID  . ranolazine  500 mg Oral BID  . sodium chloride flush  3 mL Intravenous Q12H  . timolol  1 drop Both Eyes BID  . vitamin C  500 mg Oral BID    Vital Signs    Filed Vitals:   12/17/15 0500 12/17/15 0600 12/17/15 0700 12/17/15 0800  BP: 109/62 114/68 102/60 100/65  Pulse: 90 93 87 88  Temp:    98.6 F (37 C)  TempSrc:      Resp: 18 30 18 20   Height:      Weight:    150 lb 12.7 oz (68.4 kg)  SpO2: 88% 92% 92% 98%     Intake/Output Summary (Last 24 hours) at 12/17/15 1116 Last data filed at 12/17/15 0800  Gross per 24 hour  Intake 1430.7 ml  Output    955 ml  Net  475.7 ml   Filed Weights   12/16/15 0421 12/17/15 0234 12/17/15 0800  Weight: 147 lb 11.3 oz (67 kg) 150 lb 12.7 oz (68.4 kg) 150 lb 12.7 oz (68.4 kg)    Physical Exam    General: Pleasant, NAD. Neuro: Alert and oriented X 3. Moves all extremities spontaneously. Psych: Normal affect. HEENT:  [place]/AT, EOMI.   Neck: Supple without bruits; minimal JVD. - radiated AS murmr Lungs:  Resp regular and unlabored, CTA. Heart: RRR, Nl S1 & S2.  No obvious gallops or rubs, high pitched/squeaking 1-2/6 SEM at RUSB as well as low pitch, blowing 3/6 HSM at left sternal border. no s3, s4, or murmurs. Abdomen: Soft, non-tender, non-distended, BS + x 4.  Extremities: No clubbing, cyanosis or edema. Radials 2+ and equal bilaterally. Significantly reduced lower extremity pulses.  Labs    CBC  Recent Labs  12/15/15 0907 12/17/15 0240  WBC 10.7* 13.0*  HGB 12.3* 12.0*  HCT 37.1* 37.0*  MCV 86.9  85.5  PLT 325 309   Basic Metabolic Panel  Recent Labs  12/16/15 0511 12/17/15 0240  NA 139 137  K 3.6 4.0  CL 104 102  CO2 24 24  GLUCOSE 115* 110*  BUN 15 18  CREATININE 1.25* 1.33*  CALCIUM 8.9 9.0  MG 1.9 1.8   Liver Function Tests  Recent Labs  12/15/15 1514 12/16/15 0511  AST 43* 25  ALT 60 41  ALKPHOS 52 50  BILITOT 0.7 0.8  PROT 6.5 5.9*  ALBUMIN 3.0* 2.7*   No results for input(s): LIPASE, AMYLASE in the last 72 hours. Cardiac Enzymes  Recent Labs  12/15/15 0907 12/15/15 1514 12/15/15 2112  TROPONINI 0.09* 0.08* 0.08*   BNP Invalid input(s): POCBNP D-Dimer No results for input(s): DDIMER in the last 72 hours. Hemoglobin A1C No results for input(s): HGBA1C in the last 72 hours. Fasting Lipid Panel No results for input(s): CHOL, HDL, LDLCALC, TRIG, CHOLHDL, LDLDIRECT in the last 72 hours. Thyroid Function  Tests  Recent Labs  12/16/15 0511  TSH 2.521    Telemetry    NSR with PVCs, No VT.  Rate 80-105   ECG    No EKG today    Cardiology    Echo 4/24: Severely reduced EF, 25-30% with diffuse HK. AK of inferolateral wall grade 3 DD (restrictive physiology) - with elevated LVEDP. (Moderate-) Severe aortic stenosis (fusion of right and left cusps, calculation does not suggest severe stenosis). Moderate to severe MR. Moderate RV dysfunction with mildly increased PA Pressures.  Radiology   No new studies Dg Chest 2 View  12/13/2015  CLINICAL DATA:  Worsening shortness breath and hypoxia. Recent pneumonia, GI bleeding, myocardial infarction. EXAM: CHEST  2 VIEW COMPARISON:  None. FINDINGS: The heart size and mediastinal contours are within normal limits. Pulmonary hyperinflation and coarsening of interstitial lung markings is consistent with COPD. No evidence of pulmonary consolidation or frank pulmonary edema. No evidence of pneumothorax or pleural effusion. Prior CABG noted. IMPRESSION: COPD.  No acute findings. Electronically Signed   By: Myles Rosenthal M.D.   On: 12/13/2015 16:12   Dg Chest Port 1 View  12/15/2015  CLINICAL DATA:  Chest pain EXAM: PORTABLE CHEST 1 VIEW COMPARISON:  12/13/2015 FINDINGS: There is hyperinflation of the lungs compatible with COPD. Mild peribronchial thickening. Heart is upper limits normal in size. No confluent opacities or effusions. No acute bony abnormality. IMPRESSION: COPD/bronchitic changes. Electronically Signed   By: Charlett Nose M.D.   On: 12/15/2015 10:20    Assessment & Plan    Principal Problem:   Acute (? On chronic) combined systolic and diastolic heart failure (HCC) - unsure chronicity  Overall, clinically appears to diurese quite well. No significant PND or orthopnea. -- With his severely reduced EF, we are planning a right left heart catheterization in order to better assess his volume status, cardiac output etc. in order to determine  appropriate medicines for outpatient.  For now I would continue with the oral Lasix pending results as he does appear to be euvolemic.  I will arrange for him to be followed up in the heart failure clinic due to the complex nature of his heart failure and valvular disease. He may very well need an outpatient TEE to better assess his valves. Will need close follow-up this is probably better done in the heart failure clinic. I discussed it with Dr. Shirlee Latch. He'll be looking for the right and left heart cath results.  Active Problems:   Coronary artery  disease involving native heart with angina pectoris (HCC) /Coronary artery disease involving autologous vein bypass graft  - it would appear that his only remaining conduit is the LIMA graft. Most likely this is still patent. Question would be is there is any downstream disease in the native vessel/LIMA graft.  He remains on aspirin, statin as well as Plavix plus low-dose carvedilol and lisinopril. Blood pressure is borderline. Unable to further titrate.  Plan is a right left heart catheterization. I think he only has the LIMA to inject from a coronary standpoint, however be nice to know if there is disease in the LIMA or the distal LAD which could explain the drop in EF. Echocardiographically this appears to be the only area getting perfusion and therefore with normal wall motion.    Sustained VT (ventricular tachycardia) (HCC) --  Cardiac arrest (HCC) secondary to sustained V tach requiring CPR 12/15/15  On amiodarone. No further events. - We'll probably convert to oral amiodarone tomorrow post ICD  Plan is to confirm no other cause and then likely ICD placement on Thursday.        Mitral regurgitation and aortic stenosis (Moderate to severe aortic stenosis) - prior echo suggested moderate aortic stenosis. This is really difficult to tell with low ejection fraction, but visually the valve does seem to be moving at least in the non-coronary  cusp.  Plan for now would be right left heart catheterization to give full evaluation of cardiac function as well as potential for aortic stenosis.      Cardiomyopathy, ischemic -- by cardiac catheterization the past he has a patent LIMA but other vein grafts and native circles. -- He has severe native coronary disease. -- Apparently new drop in EF from November of last year. This is despite having no acute event. Most likely cause is steady progression of disease with now no longer having any RCA or circumflex perfusion. There is also the compounding factor of valvular disease.  Plan is for right left heart catheterization today to exclude any additional CAD. Also to get a better sense of collateral flow    GI bleed - from March 2017 this led to mild troponin elevation. I don't think this was a inciting event for this drop in ejection fraction.   Hypertension - not truly an issue. He is on low-dose ACE inhibitor and carvedilol that cannot be further titrated due to borderline pressures.   Hypercholesteremia - on statin   Protein-calorie malnutrition, moderate (HCC) - defer to primary physician team   Elevated troponin - likely demand ischemia from reduced EF.   Plan: for right left heart catheterization today and likely ICD tomorrow.  -- Would expect a good likely be discharged following his ICD placement as early as Friday. Will arrange for follow-up in the heart failure clinic. -- Further evaluation of his valvular disease can be done in the outpatient setting.  I suspect that he is probably stable to move to a cardiac stepdown unit either in TCU or 3 W. if ICU bed availability requires it   Signed, Herbie Baltimore, Piedad Climes, M.D., M.S. Interventional Cardiologist   Pager # [phone] Phone # [phone] [address] [place], [place] [address]

## 2015-12-17 NOTE — Research (Signed)
LEADERS FREE II Informed Consent   Subject Name: Andre Wilson  Subject met inclusion and exclusion criteria.  The informed consent form, study requirements and expectations were reviewed with the subject and questions and concerns were addressed prior to the signing of the consent form.  The subject verbalized understanding of the trail requirements.  The subject agreed to participate in the LEADERS FREE II trial and signed the informed consent.  The informed consent was obtained prior to performance of any protocol-specific procedures for the subject.  A copy of the signed informed consent was given to the subject and a copy was placed in the subject's medical record.  Patient will be in research study if LEADERS FREE II Stent in place.   Hedrick,Tammy W 12/17/2015, 9:53 AM

## 2015-12-17 NOTE — Progress Notes (Signed)
[facility]                                                                              Patient Demographics  Andre Wilson, is a 80 y.o. male, DOB - 07-08-1936, ZOX:[id number]  Admit date - 12/13/2015   Admitting Physician Lorretta Harp, MD  Outpatient Primary MD for the patient is No primary care provider on file.  Outpatient specialists: Cardiologist at [facility] and in English   LOS - 4  days    Chief Complaint  Patient presents with  . Shortness of Breath       Brief Summary   Andre Wilson is a 80 y.o. male with medical history significant of hypertension, hyperlipidemia, GI bleeding, CAD, myocardial infarction, S/P of CABG 1987, GI bleeding, who presented with shortness of breath and bilateral ankle edema for past several days PTA, which had been progressively getting worse. He had mild dry cough, but no chest pain, fever or chills. Patient was admitted for CHF exacerbation. After being admitted to the hospital he was being treated for CHF, his echocardiogram was pending, he was close to his baseline on 12/15/2015 morning, however around 8:45 AM on 12/15/2015 patient went into a prolonged V. tach rhythm and became briefly unresponsive, he never lost his pulse, he was given IV magnesium, placed on IV amiodarone, stat echogram and cardiology consult was obtained and he was transferred to stepdown/ICU status. Cardiology planning cardiac cath and ICD.     Assessment & Plan    Principal Problem:  Acute on chronic Combined systolic and diastolic CHF -  last EF more than one year ago was 55% due to ischemic cardiomyopathy EF now 25% -30% with diffuse hypokinesis, akinesis of inferior lateral myocardium likely due to new sustained monomorphic V. tach .  -Cardiology following, positive balance of 1.64 L, awaiting cardiac cath today.    VT arrest on 12/15/15  - On 12/15/2015, patient had prolonged run of sustained monomorphic V. tach and became briefly unresponsive,  CPR < 1 min, had pulse immediately, no shocks. Cards and EP following. - Cardiology planning cardiac cath and ICD - Continue aspirin, Plavix, amiodarone, Coreg, ACE inhibitor   CAD with elevated troponins possibly due to demand ischemia from CHF and VT arrest - chest pain-free, continue aspirin, Coreg, statin, Zetia, Plavix and Ranexa for secondary prevention.  - Cardiac cath today   Moderate to severe MR and AS - Follow cardiology recommendations  Dyslipidemia. -  On statin and Zetia..   Moderate  protein calorie malnutrition  - Nutrition consult, continue nutrition supplements   Essential hypertension.  - Discontinued Norvasc, currently on Coreg, ACE inhibitor will monitor blood pressure.  H/O Diverticular bleed - stable.  Code Status: full code  Family Communication: Discussed in detail with the patient, all imaging results, lab results explained to the patient   Disposition Plan: cont in SDU today  Time Spent in minutes  25 minutes  Procedures  2-D echocardiogram Global hypokinesis with akinesis of the inferolateral wall;overall severely reduced LV function; restrictive filling with  elevated LV filling pressure; moderate LVE; mild LAE; moderatelyreduced RV function; calcified aortic valve with fusion of  right and left cusps; severe AS by continuity equation (AVA 0.75 cm2); however this appears likely to be overestimated as mean gradient 10 mmHg and dimensionless index 0.39 not supportive of severe AS; mild AI; moderate to severe MR; trace TR with mildly elevated pulmonary pressure.   Consults   Cardiology  EP   DVT Prophylaxis  heparin  Medications  Scheduled Meds: . aspirin  325 mg Oral Daily  . atorvastatin  40 mg Oral Daily  . carvedilol  3.125 mg Oral BID WC  . clopidogrel  75 mg Oral Daily  . ezetimibe  10 mg Oral Daily  . feeding supplement (ENSURE ENLIVE)  237 mL Oral BID PC  . ferrous sulfate  325 mg Oral TID WC  . folic acid  1 mg Oral Daily    . furosemide  40 mg Oral BID  . heparin subcutaneous  5,000 Units Subcutaneous Q8H  . latanoprost  1 drop Both Eyes QHS  . lisinopril  2.5 mg Oral Daily  . multivitamin with minerals  1 tablet Oral Daily  . potassium chloride  20 mEq Oral BID  . ranolazine  500 mg Oral BID  . sodium chloride flush  3 mL Intravenous Q12H  . timolol  1 drop Both Eyes BID  . vitamin C  500 mg Oral BID   Continuous Infusions: . sodium chloride 10 mL/hr at 12/17/15 0800  . amiodarone 30 mg/hr (12/16/15 2140)   PRN Meds:.sodium chloride, acetaminophen, linaclotide, morphine injection, nitroGLYCERIN, ondansetron (ZOFRAN) IV, sodium chloride flush, zolpidem   Antibiotics   Anti-infectives    None        Subjective:   Andre Wilson was seen and examined today.  Patient denies dizziness, chest pain, shortness of breath, abdominal pain, N/V/D/C, new weakness, numbess, tingling. No acute events overnight.    Objective:   Filed Vitals:   12/17/15 0500 12/17/15 0600 12/17/15 0700 12/17/15 0800  BP: 109/62 114/68 102/60 100/65  Pulse: 90 93 87 88  Temp:    98.6 F (37 C)  TempSrc:      Resp: Height:      Weight:    68.4 kg (150 lb 12.7 oz)  SpO2: 88% 92% 92% 98%    Intake/Output Summary (Last 24 hours) at 12/17/15 0958 Last data filed at 12/17/15 0800  Gross per 24 hour  Intake 1584.1 ml  Output   1155 ml  Net  429.1 ml     Wt Readings from Last 3 Encounters:  12/17/15 68.4 kg (150 lb 12.7 oz)     Exam  General: Alert and oriented x 3, NAD  HEENT:  PERRLA, EOMI, Anicteric Sclera  Neck: Supple, minimal JVD, no masses  CVS: S1 S2 auscultated, 2/6 SEM. Regular rate and rhythm.  Respiratory: Clear to auscultation bilaterally, no wheezing, rales or rhonchi  Abdomen: Soft, nontender, nondistended, + bowel sounds  Ext: no cyanosis clubbing or edema  Neuro: AAOx3, Cr N's II- XII. Strength 5/5 upper and lower extremities bilaterally  Skin: No rashes  Psych:  Normal affect and demeanor, alert and oriented x3    Data Reviewed:  I have personally reviewed following labs and imaging studies  Micro Results Recent Results (from the past 240 hour(s))  MRSA PCR Screening     Status: None   Collection Time: 12/15/15  9:51 AM  Result Value Ref Range Status   MRSA by PCR NEGATIVE NEGATIVE Final    Comment:  The GeneXpert MRSA Assay (FDA approved for NASAL specimens only), is one component of a comprehensive MRSA colonization surveillance program. It is not intended to diagnose MRSA infection nor to guide or monitor treatment for MRSA infections.     Radiology Reports Dg Chest 2 View  12/13/2015  CLINICAL DATA:  Worsening shortness breath and hypoxia. Recent pneumonia, GI bleeding, myocardial infarction. EXAM: CHEST  2 VIEW COMPARISON:  None. FINDINGS: The heart size and mediastinal contours are within normal limits. Pulmonary hyperinflation and coarsening of interstitial lung markings is consistent with COPD. No evidence of pulmonary consolidation or frank pulmonary edema. No evidence of pneumothorax or pleural effusion. Prior CABG noted. IMPRESSION: COPD.  No acute findings. Electronically Signed   By: Myles RosenthalJohn  Stahl M.D.   On: 12/13/2015 16:12   Dg Chest Port 1 View  12/15/2015  CLINICAL DATA:  Chest pain EXAM: PORTABLE CHEST 1 VIEW COMPARISON:  12/13/2015 FINDINGS: There is hyperinflation of the lungs compatible with COPD. Mild peribronchial thickening. Heart is upper limits normal in size. No confluent opacities or effusions. No acute bony abnormality. IMPRESSION: COPD/bronchitic changes. Electronically Signed   By: Charlett NoseKevin  Dover M.D.   On: 12/15/2015 10:20    CBC  Recent Labs Lab 12/13/15 1552 12/14/15 0240 12/15/15 0907 12/17/15 0240  WBC 11.3* 8.7 10.7* 13.0*  HGB 11.7* 11.0* 12.3* 12.0*  HCT 36.5* 34.0* 37.1* 37.0*  PLT 279 272 325 309  MCV 86.1 86.3 86.9 85.5  MCH 27.6 27.9 28.8 27.7  MCHC 32.1 32.4 33.2 32.4  RDW 14.6 14.6  14.8 14.7  LYMPHSABS 1.5  --   --   --   MONOABS 0.9  --   --   --   EOSABS 0.1  --   --   --   BASOSABS 0.0  --   --   --     Chemistries   Recent Labs Lab 12/13/15 1552  12/15/15 0250 12/15/15 0907 12/15/15 1514 12/16/15 0511 12/17/15 0240  NA 141  < > 139 140 135 139 137  K 3.9  < > 3.8 3.7 3.9 3.6 4.0  CL 107  < > 104 104 97* 104 102  CO2 22  < > 23 22 24 24 24   GLUCOSE 104*  < > 89 172* 370* 115* 110*  BUN 11  < > 17 15 15 15 18   CREATININE 1.16  < > 1.33* 1.55* 1.39* 1.25* 1.33*  CALCIUM 9.1  < > 8.8* 9.4 8.7* 8.9 9.0  MG  --   --  1.8  --  1.9 1.9 1.8  AST 19  --   --   --  43* 25  --   ALT 22  --   --   --  60 41  --   ALKPHOS 54  --   --   --  52 50  --   BILITOT 0.9  --   --   --  0.7 0.8  --   < > = values in this interval not displayed. ------------------------------------------------------------------------------------------------------------------ estimated creatinine clearance is 45 mL/min (by C-G formula based on Cr of 1.33). ------------------------------------------------------------------------------------------------------------------ No results for input(s): HGBA1C in the last 72 hours. ------------------------------------------------------------------------------------------------------------------ No results for input(s): CHOL, HDL, LDLCALC, TRIG, CHOLHDL, LDLDIRECT in the last 72 hours. ------------------------------------------------------------------------------------------------------------------  Recent Labs  12/16/15 0511  TSH 2.521   ------------------------------------------------------------------------------------------------------------------ No results for input(s): VITAMINB12, FOLATE, FERRITIN, TIBC, IRON, RETICCTPCT in the last 72 hours.  Coagulation profile  Recent Labs Lab 12/13/15 2100 12/17/15 0240  INR 1.16 1.15    No results for input(s): DDIMER in the last 72 hours.  Cardiac Enzymes  Recent Labs Lab  12/15/15 0907 12/15/15 1514 12/15/15 2112  TROPONINI 0.09* 0.08* 0.08*   ------------------------------------------------------------------------------------------------------------------ Invalid input(s): POCBNP  No results for input(s): GLUCAP in the last 72 hours.   Rasmus Preusser M.D. [facility] 12/17/2015, 9:58 AM  Pager: [phone] Between 7am to 7pm - call Pager - [phone]  After 7pm go to [email or web address] - password [email or web address]  Call night coverage person covering after 7pm

## 2015-12-17 NOTE — Progress Notes (Signed)
Patient Name: Andre Wilson Date of Encounter: 12/17/2015  Primary Cardiologist: [facility] in [place], [place]   80 yo male with PMH of CAD s/p CABG, HTN, HLD with last known echo on 10/03/2014 demonstrated EF 50-55% presented with acute on chronic HF symptom, while admitted, in AM of 4/24, he went into VT requiring Code Blue.   Principal Problem:   Acute combined systolic and diastolic heart failure (HCC) - unsure chronicity Active Problems:   GI bleed   Hypertension   Hypercholesteremia   Coronary artery disease involving native heart with angina pectoris (HCC)   Protein-calorie malnutrition, moderate (HCC)   Elevated troponin   Sustained VT (ventricular tachycardia) (HCC)   Cardiac arrest (HCC) secondary to sustained V tach requiring CPR 12/15/15   Acute on chronic congestive heart failure (HCC)   Coronary artery disease involving autologous vein bypass graft   Moderate to severe aortic stenosis   Mitral regurgitation and aortic stenosis   Cardiomyopathy, ischemic   Essential hypertension    SUBJECTIVE  Denies any CP or SOB.   CURRENT MEDS . aspirin  325 mg Oral Daily  . atorvastatin  40 mg Oral Daily  . carvedilol  3.125 mg Oral BID WC  . clopidogrel  75 mg Oral Daily  . ezetimibe  10 mg Oral Daily  . feeding supplement (ENSURE ENLIVE)  237 mL Oral BID PC  . ferrous sulfate  325 mg Oral TID WC  . folic acid  1 mg Oral Daily  . furosemide  40 mg Oral BID  . heparin subcutaneous  5,000 Units Subcutaneous Q8H  . latanoprost  1 drop Both Eyes QHS  . lisinopril  2.5 mg Oral Daily  . multivitamin with minerals  1 tablet Oral Daily  . potassium chloride  20 mEq Oral BID  . ranolazine  500 mg Oral BID  . sodium chloride flush  3 mL Intravenous Q12H  . timolol  1 drop Both Eyes BID  . vitamin C  500 mg Oral BID    OBJECTIVE  Filed Vitals:   12/17/15 0300 12/17/15 0400 12/17/15 0500 12/17/15 0600  BP: 101/67 98/57 109/62 114/68  Pulse: 85 85 90 93  Temp:   98.4 F (36.9 C)    TempSrc:  Oral    Resp: Height:      Weight:      SpO2: 97% 95% 88% 92%    Intake/Output Summary (Last 24 hours) at 12/17/15 0837 Last data filed at 12/17/15 0600  Gross per 24 hour  Intake 1567.4 ml  Output   1055 ml  Net  512.4 ml   Filed Weights   12/15/15 1000 12/16/15 0421 12/17/15 0234  Weight: 143 lb 4.8 oz (65 kg) 147 lb 11.3 oz (67 kg) 150 lb 12.7 oz (68.4 kg)    PHYSICAL EXAM  General: Pleasant, NAD. Neuro: Alert and oriented X 3. Moves all extremities spontaneously. Psych: Normal affect. HEENT:  Normal  Neck: Supple without bruits +minimal JVD. Lungs:  Resp regular and unlabored, CTA Heart: RRR no s3, s4. +2/6 systolic murmur Abdomen: Soft, non-tender, non-distended, BS + x 4.  Extremities: No clubbing, cyanosis. DP/PT/Radials 2+ and equal bilaterally. No significant LE pitting edema  Accessory Clinical Findings  CBC  Recent Labs  12/15/15 0907 12/17/15 0240  WBC 10.7* 13.0*  HGB 12.3* 12.0*  HCT 37.1* 37.0*  MCV 86.9 85.5  PLT 325 309   Basic Metabolic Panel  Recent Labs  12/16/15 0511 12/17/15  0240  NA 139 137  K 3.6 4.0  CL 104 102  CO2 24 24  GLUCOSE 115* 110*  BUN 15 18  CREATININE 1.25* 1.33*  CALCIUM 8.9 9.0  MG 1.9 1.8   Liver Function Tests  Recent Labs  12/15/15 1514 12/16/15 0511  AST 43* 25  ALT 60 41  ALKPHOS 52 50  BILITOT 0.7 0.8  PROT 6.5 5.9*  ALBUMIN 3.0* 2.7*   Cardiac Enzymes  Recent Labs  12/15/15 0907 12/15/15 1514 12/15/15 2112  TROPONINI 0.09* 0.08* 0.08*   Thyroid Function Tests  Recent Labs  12/16/15 0511  TSH 2.521    TELE NSR without significant ventricular ectopy, longest run of PVCs 3 beats    ECG  No new EKG  Echocardiogram 12/15/2015  LV EF: 25% - 30%  ------------------------------------------------------------------- Indications: CHF - 428.0.  ------------------------------------------------------------------- History:  PMH: Coronary artery disease. Risk factors: Hypertension. Hypercholesterolemia.  ------------------------------------------------------------------- Study Conclusions  - Left ventricle: The cavity size was moderately dilated. Wall  thickness was normal. Systolic function was severely reduced. The  estimated ejection fraction was in the range of 25% to 30%.  Diffuse hypokinesis. There is akinesis of the inferolateral  myocardium. Doppler parameters are consistent with restrictive  physiology, indicative of decreased left ventricular diastolic  compliance and/or increased left atrial pressure. Doppler  parameters are consistent with high ventricular filling pressure. - Aortic valve: Valve mobility was restricted. There was severe  stenosis. There was mild regurgitation. Valve area (VTI): 0.79  cm^2. Valve area (Vmax): 0.75 cm^2. Valve area (Vmean): 0.74  cm^2. - Mitral valve: There was moderate to severe regurgitation. - Left atrium: The atrium was mildly dilated. - Right ventricle: Systolic function was moderately reduced. - Pulmonary arteries: Systolic pressure was mildly increased. PA  peak pressure: 33 mm Hg (S).  Impressions:  - Global hypokinesis with akinesis of the inferolateral wall;  overall severely reduced LV function; restrictive filling with  elevated LV filling pressure; moderate LVE; mild LAE; moderately  reduced RV function; calcified aortic valve with fusion of right  and left cusps; severe AS by continuity equation (AVA 0.75 cm2);  however this appears likely to be overestimated as mean gradient  10 mmHg and dimensionless index 0.39 not supportive of severe AS;  mild AI; moderate to severe MR; trace TR with mildly elevated  pulmonary pressure.    Radiology/Studies  Dg Chest 2 View  12/13/2015  CLINICAL DATA:  Worsening shortness breath and hypoxia. Recent pneumonia, GI bleeding, myocardial infarction. EXAM: CHEST  2 VIEW COMPARISON:   None. FINDINGS: The heart size and mediastinal contours are within normal limits. Pulmonary hyperinflation and coarsening of interstitial lung markings is consistent with COPD. No evidence of pulmonary consolidation or frank pulmonary edema. No evidence of pneumothorax or pleural effusion. Prior CABG noted. IMPRESSION: COPD.  No acute findings. Electronically Signed   By: Myles Rosenthal M.D.   On: 12/13/2015 16:12   Dg Chest Port 1 View  12/15/2015  CLINICAL DATA:  Chest pain EXAM: PORTABLE CHEST 1 VIEW COMPARISON:  12/13/2015 FINDINGS: There is hyperinflation of the lungs compatible with COPD. Mild peribronchial thickening. Heart is upper limits normal in size. No confluent opacities or effusions. No acute bony abnormality. IMPRESSION: COPD/bronchitic changes. Electronically Signed   By: Charlett Nose M.D.   On: 12/15/2015 10:20    ASSESSMENT AND PLAN  1. Acute on chronic combined systolic and diastolic HF with acute drop in LV function  - Echo 10/03/14 demonstrated EF 50-55% with aortic stenosis  -  Echo 12/15/2015 EF 25-30%, diffuse hypokinesis, akinesis of inferolateral myocardium, severe AS with mean valve area 0.74, moderate to severe MR, moderately reduced RVEF, PA peak pressure 33mmHg  - close to euvolemic level, still on lasix, unclear cause for sudden drop in EF, planning for L&RHC today and ICD tomorrow. L&RHC today to evaluate degree of severity of AS and MR and patency of LIMA graft.    2. Possible severe AS and severe MR: see above  3. VT arrest in AM of 12/15/2015  - [facility]. No significant ventricular ectopy overnight  4. CAD s/p CABG   Signed, Amedeo PlentyMeng, Hao PA-C Pager: [phone]  I have seen and examined this patient with Azalee CourseHao Meng.  Agree with above, note added to reflect my findings.  On exam, regular rhythm, 2 systolic murmur, lungs clear.  Plan for left and right heart catheterization today to determine his filling pressures in his coronary anatomy. I  did discuss with him today the possibility of a defibrillator tomorrow based off of his coronary anatomy and right heart catheterization. He is agreeable to this. I discussed the risks and benefits. Risks include bleeding, infection, tamponade, and pneumothorax. He understands these risks and wishes to proceed. Would keep him nothing by mouth tonight after midnight.  Jalexus Brett M. Rutger Salton MD 12/17/2015 2:37 PM

## 2015-12-18 ENCOUNTER — Encounter (HOSPITAL_COMMUNITY): Payer: Self-pay | Admitting: Interventional Cardiology

## 2015-12-18 ENCOUNTER — Encounter (HOSPITAL_COMMUNITY)
Admission: EM | Disposition: A | Discharge status: Home Health | Payer: Self-pay | Source: Home / Self Care | Attending: Internal Medicine

## 2015-12-18 DIAGNOSIS — R079 Chest pain, unspecified: Secondary | ICD-10-CM

## 2015-12-18 DIAGNOSIS — I472 Ventricular tachycardia: Secondary | ICD-10-CM

## 2015-12-18 DIAGNOSIS — I255 Ischemic cardiomyopathy: Secondary | ICD-10-CM

## 2015-12-18 DIAGNOSIS — I25119 Atherosclerotic heart disease of native coronary artery with unspecified angina pectoris: Secondary | ICD-10-CM

## 2015-12-18 DIAGNOSIS — I25810 Atherosclerosis of coronary artery bypass graft(s) without angina pectoris: Secondary | ICD-10-CM

## 2015-12-18 DIAGNOSIS — I350 Nonrheumatic aortic (valve) stenosis: Secondary | ICD-10-CM

## 2015-12-18 HISTORY — PX: EP IMPLANTABLE DEVICE: SHX172B

## 2015-12-18 LAB — BASIC METABOLIC PANEL
ANION GAP: 10 (ref 5–15)
BUN: 19 mg/dL (ref 6–20)
CHLORIDE: 104 mmol/L (ref 101–111)
CO2: 24 mmol/L (ref 22–32)
Calcium: 9 mg/dL (ref 8.9–10.3)
Creatinine, Ser: 1.32 mg/dL — ABNORMAL HIGH (ref 0.61–1.24)
GFR calc non Af Amer: 50 mL/min — ABNORMAL LOW (ref >60)
GFR, EST AFRICAN AMERICAN: 58 mL/min — AB (ref >60)
Glucose, Bld: 109 mg/dL — ABNORMAL HIGH (ref 65–99)
Potassium: 4.7 mmol/L (ref 3.5–5.1)
Sodium: 138 mmol/L (ref 135–145)

## 2015-12-18 LAB — MAGNESIUM: Magnesium: 1.9 mg/dL (ref 1.7–2.4)

## 2015-12-18 SURGERY — ICD IMPLANT
Anesthesia: LOCAL

## 2015-12-18 MED ORDER — CEFAZOLIN SODIUM 1-5 GM-% IV SOLN
1.0000 g | Freq: Four times a day (QID) | INTRAVENOUS | Status: AC
  Administered 2015-12-18 – 2015-12-19 (×2): 1 g via INTRAVENOUS
  Filled 2015-12-18 (×2): qty 50

## 2015-12-18 MED ORDER — LIDOCAINE HCL (PF) 1 % IJ SOLN
INTRAMUSCULAR | Status: DC | PRN
  Administered 2015-12-18: 45 mL

## 2015-12-18 MED ORDER — SODIUM CHLORIDE 0.9 % IR SOLN
Status: AC
  Filled 2015-12-18: qty 2

## 2015-12-18 MED ORDER — HEPARIN (PORCINE) IN NACL 2-0.9 UNIT/ML-% IJ SOLN
INTRAMUSCULAR | Status: AC
  Filled 2015-12-18: qty 500

## 2015-12-18 MED ORDER — ACETAMINOPHEN 325 MG PO TABS: 325.0000 mg | ORAL_TABLET | ORAL | Status: DC | PRN

## 2015-12-18 MED ORDER — IOPAMIDOL (ISOVUE-370) INJECTION 76%
INTRAVENOUS | Status: AC
  Filled 2015-12-18: qty 50

## 2015-12-18 MED ORDER — ONDANSETRON HCL 4 MG/2ML IJ SOLN
4.0000 mg | Freq: Four times a day (QID) | INTRAMUSCULAR | Status: DC | PRN
  Administered 2015-12-19 – 2015-12-20 (×3): 4 mg via INTRAVENOUS
  Filled 2015-12-18 (×3): qty 2

## 2015-12-18 MED ORDER — LIDOCAINE HCL (PF) 1 % IJ SOLN
INTRAMUSCULAR | Status: AC
  Filled 2015-12-18: qty 60

## 2015-12-18 MED ORDER — IOPAMIDOL (ISOVUE-370) INJECTION 76%
INTRAVENOUS | Status: DC | PRN
  Administered 2015-12-18: 15 mL via INTRAVENOUS

## 2015-12-18 SURGICAL SUPPLY — 7 items
CABLE SURGICAL S-101-97-12 (CABLE) ×2 IMPLANT
ICD ELLIPSE VR CD1411-36Q (ICD Generator) ×2 IMPLANT
LEAD DURATA 7122Q-65CM (Lead) ×2 IMPLANT
PAD DEFIB LIFELINK (PAD) ×2 IMPLANT
SHEATH CLASSIC 7F (SHEATH) ×2 IMPLANT
SHEATH CLASSIC 7F 25CM (SHEATH) ×2 IMPLANT
TRAY PACEMAKER INSERTION (PACKS) ×2 IMPLANT

## 2015-12-18 NOTE — Progress Notes (Signed)
[facility]                                                                              Patient Demographics  Andre Wilson, is a 80 y.o. male, DOB - Oct 15, 1935, BJY:[id number]  Admit date - 12/13/2015   Admitting Physician Lorretta Harp, MD  Outpatient Primary MD for the patient is No primary care provider on file.  Outpatient specialists: Cardiologist at [facility] and in [place]   LOS - 5  days    Chief Complaint  Patient presents with  . Shortness of Breath       Brief Summary   Andre Wilson is a 80 y.o. male with medical history significant of hypertension, hyperlipidemia, GI bleeding, CAD, myocardial infarction, S/P of CABG 1987, GI bleeding, who presented with shortness of breath and bilateral ankle edema for past several days PTA, which had been progressively getting worse. He had mild dry cough, but no chest pain, fever or chills. Patient was admitted for CHF exacerbation. After being admitted to the hospital he was being treated for CHF, his echocardiogram was pending, he was close to his baseline on 12/15/2015 morning, however around 8:45 AM on 12/15/2015 patient went into a prolonged V. tach rhythm and became briefly unresponsive, he never lost his pulse, he was given IV magnesium, placed on IV amiodarone, stat echogram and cardiology consult was obtained and he was transferred to stepdown/ICU status. Cardiology planning cardiac cath and ICD.     Assessment & Plan    Principal Problem:  Acute on chronic Combined systolic and diastolic CHF -  last EF more than one year ago was 55% due to ischemic cardiomyopathy EF now 25% -30% with diffuse hypokinesis, akinesis of inferior lateral myocardium likely due to new sustained monomorphic V. tach .  -Cardiology following, positive balance of 531cc - Cardiac cath 4/26 showed no PCI targets, essentially minimal collateral flow from LIMA-LAD with a vessel beds, see detailed report below.   VT arrest on 12/15/15  -  On 12/15/2015, patient had prolonged run of sustained monomorphic V. tach and became briefly unresponsive, CPR < 1 min, had pulse immediately, no shocks. Cards and EP following. - ICD placement today - Continue aspirin, Plavix, amiodarone, Coreg, ACE inhibitor - Per cardiology transitioned to oral amiodarone after the ICD   CAD with elevated troponins possibly due to demand ischemia from CHF and VT arrest - chest pain-free, continue aspirin, Coreg, statin, Zetia, Plavix and Ranexa for secondary prevention.  - Cardiac cath today   Moderate to severe MR and AS - Follow cardiology recommendations  Dyslipidemia. -  On statin and Zetia..   Moderate  protein calorie malnutrition  - Nutrition consult, continue nutrition supplements   Essential hypertension.  - Discontinued Norvasc, currently on Coreg, ACE inhibitor will monitor blood pressure.  H/O Diverticular bleed - stable.  Generalized debility -  PT OT evaluation   Code Status: full code  Family Communication: Discussed in detail with the patient, all imaging results, lab results explained to the patient   Disposition Plan: cont in SDU today, hopefully DC home after ICD placement tomorrow  Time Spent in minutes  25  minutes  Procedures  2-D echocardiogram Global hypokinesis with akinesis of the inferolateral wall;overall severely reduced LV function; restrictive filling with  elevated LV filling pressure; moderate LVE; mild LAE; moderatelyreduced RV function; calcified aortic valve with fusion of right and left cusps; severe AS by continuity equation (AVA 0.75 cm2); however this appears likely to be overestimated as mean gradient 10 mmHg and dimensionless index 0.39 not supportive of severe AS; mild AI; moderate to severe MR; trace TR with mildly elevated pulmonary pressure.   Consults   Cardiology  EP   DVT Prophylaxis  heparin  Medications  Scheduled Meds: . aspirin  81 mg Oral Daily  . atorvastatin  40 mg  Oral Daily  . carvedilol  3.125 mg Oral BID WC  .  ceFAZolin (ANCEF) IV  2 g Intravenous To SS-Surg  . clopidogrel  75 mg Oral Daily  . ezetimibe  10 mg Oral Daily  . feeding supplement (ENSURE ENLIVE)  237 mL Oral BID PC  . ferrous sulfate  325 mg Oral TID WC  . folic acid  1 mg Oral Daily  . furosemide  40 mg Oral BID  . gentamicin irrigation  80 mg Irrigation To SS-Surg  . heparin  5,000 Units Subcutaneous Q8H  . latanoprost  1 drop Both Eyes QHS  . lisinopril  2.5 mg Oral Daily  . multivitamin with minerals  1 tablet Oral Daily  . potassium chloride  20 mEq Oral BID  . ranolazine  500 mg Oral BID  . sodium chloride flush  3 mL Intravenous Q12H  . timolol  1 drop Both Eyes BID  . vitamin C  500 mg Oral BID   Continuous Infusions: . amiodarone 30 mg/hr (12/17/15 2319)   PRN Meds:.sodium chloride, acetaminophen, linaclotide, morphine injection, nitroGLYCERIN, ondansetron (ZOFRAN) IV, oxyCODONE-acetaminophen, sodium chloride flush, zolpidem   Antibiotics   Anti-infectives    Start     Dose/Rate Route Frequency Ordered Stop   12/18/15 1130  gentamicin (GARAMYCIN) 80 mg in sodium chloride irrigation 0.9 % 500 mL irrigation     80 mg Irrigation To [facility] 12/17/15 1932 12/19/15 1130   12/18/15 1130  ceFAZolin (ANCEF) IVPB 2g/100 mL premix     2 g 200 mL/hr over 30 Minutes Intravenous To [facility] 12/17/15 1932 12/19/15 1130        Subjective:   [name] was seen and examined today. No specific complaints, awaiting ICD procedure today. Overall weak. Patient denies dizziness, chest pain, shortness of breath, abdominal pain, N/V/D/C, new weakness, numbess, tingling. No acute events overnight.    Objective:   Filed Vitals:   12/18/15 0300 12/18/15 0400 12/18/15 0500 12/18/15 0600  BP: 97/58 104/64 98/53 109/70  Pulse: 85 86 84 90  Temp:  97.8 F (36.6 C)    TempSrc:  Oral    Resp: 11 23 18 18   Height:      Weight:   67.6 kg (149 lb 0.5 oz)     SpO2: 95% 93% 90% 93%    Intake/Output Summary (Last 24 hours) at 12/18/15 0949 Last data filed at 12/18/15 0600  Gross per 24 hour  Intake  830.7 ml  Output   1960 ml  Net -1129.3 ml     Wt Readings from Last 3 Encounters:  12/18/15 67.6 kg (149 lb 0.5 oz)     Exam  General: Alert and oriented x 3, NAD  HEENT:   Neck: Supple, minimal JVD, no masses  CVS: S1 S2 auscultated, 2/6  SEM. Regular rate and rhythm.  Respiratory: CTAB, no wheezing   Abdomen: Soft, nontender, nondistended, + bowel sounds  Ext: no cyanosis clubbing or edema  Neuro: no new deficits   Skin: No rashes  Psych: Normal affect and demeanor, alert and oriented x3    Data Reviewed:  I have personally reviewed following labs and imaging studies  Micro Results Recent Results (from the past 240 hour(s))  MRSA PCR Screening     Status: None   Collection Time: 12/15/15  9:51 AM  Result Value Ref Range Status   MRSA by PCR NEGATIVE NEGATIVE Final    Comment:        The GeneXpert MRSA Assay (FDA approved for NASAL specimens only), is one component of a comprehensive MRSA colonization surveillance program. It is not intended to diagnose MRSA infection nor to guide or monitor treatment for MRSA infections.     Radiology Reports Dg Chest 2 View  12/13/2015  CLINICAL DATA:  Worsening shortness breath and hypoxia. Recent pneumonia, GI bleeding, myocardial infarction. EXAM: CHEST  2 VIEW COMPARISON:  None. FINDINGS: The heart size and mediastinal contours are within normal limits. Pulmonary hyperinflation and coarsening of interstitial lung markings is consistent with COPD. No evidence of pulmonary consolidation or frank pulmonary edema. No evidence of pneumothorax or pleural effusion. Prior CABG noted. IMPRESSION: COPD.  No acute findings. Electronically Signed   By: Myles Rosenthal M.D.   On: 12/13/2015 16:12   Dg Chest Port 1 View  12/15/2015  CLINICAL DATA:  Chest pain EXAM: PORTABLE CHEST 1 VIEW  COMPARISON:  12/13/2015 FINDINGS: There is hyperinflation of the lungs compatible with COPD. Mild peribronchial thickening. Heart is upper limits normal in size. No confluent opacities or effusions. No acute bony abnormality. IMPRESSION: COPD/bronchitic changes. Electronically Signed   By: Charlett Nose M.D.   On: 12/15/2015 10:20    CBC  Recent Labs Lab 12/13/15 1552 12/14/15 0240 12/15/15 0907 12/17/15 0240 12/17/15 1814  WBC 11.3* 8.7 10.7* 13.0* 11.0*  HGB 11.7* 11.0* 12.3* 12.0* 11.7*  HCT 36.5* 34.0* 37.1* 37.0* 36.0*  PLT 279 272 325 309 293  MCV 86.1 86.3 86.9 85.5 85.1  MCH 27.6 27.9 28.8 27.7 27.7  MCHC 32.1 32.4 33.2 32.4 32.5  RDW 14.6 14.6 14.8 14.7 14.8  LYMPHSABS 1.5  --   --   --   --   MONOABS 0.9  --   --   --   --   EOSABS 0.1  --   --   --   --   BASOSABS 0.0  --   --   --   --     Chemistries   Recent Labs Lab 12/13/15 1552  12/15/15 0250 12/15/15 0907 12/15/15 1514 12/16/15 0511 12/17/15 0240 12/17/15 1814 12/18/15 0835  NA 141  < > 139 140 135 139 137  --  138  K 3.9  < > 3.8 3.7 3.9 3.6 4.0  --  4.7  CL 107  < > 104 104 97* 104 102  --  104  CO2 22  < > 23 22 24 24 24   --  24  GLUCOSE 104*  < > 89 172* 370* 115* 110*  --  109*  BUN 11  < > 17 15 15 15 18   --  19  CREATININE 1.16  < > 1.33* 1.55* 1.39* 1.25* 1.33* 1.37* 1.32*  CALCIUM 9.1  < > 8.8* 9.4 8.7* 8.9 9.0  --  9.0  MG  --   --  1.8  --  1.9 1.9 1.8  --  1.9  AST 19  --   --   --  43* 25  --   --   --   ALT 22  --   --   --  60 41  --   --   --   ALKPHOS 54  --   --   --  52 50  --   --   --   BILITOT 0.9  --   --   --  0.7 0.8  --   --   --   < > = values in this interval not displayed. ------------------------------------------------------------------------------------------------------------------ estimated creatinine clearance is 44.8 mL/min (by C-G formula based on Cr of  1.32). ------------------------------------------------------------------------------------------------------------------ No results for input(s): HGBA1C in the last 72 hours. ------------------------------------------------------------------------------------------------------------------ No results for input(s): CHOL, HDL, LDLCALC, TRIG, CHOLHDL, LDLDIRECT in the last 72 hours. ------------------------------------------------------------------------------------------------------------------  Recent Labs  12/16/15 0511  TSH 2.521   ------------------------------------------------------------------------------------------------------------------ No results for input(s): VITAMINB12, FOLATE, FERRITIN, TIBC, IRON, RETICCTPCT in the last 72 hours.  Coagulation profile  Recent Labs Lab 12/13/15 2100 12/17/15 0240  INR 1.16 1.15    No results for input(s): DDIMER in the last 72 hours.  Cardiac Enzymes  Recent Labs Lab 12/15/15 0907 12/15/15 1514 12/15/15 2112  TROPONINI 0.09* 0.08* 0.08*   ------------------------------------------------------------------------------------------------------------------ Invalid input(s): POCBNP  No results for input(s): GLUCAP in the last 72 hours.   Maguire Sime M.D. [facility] 12/18/2015, 9:49 AM  Pager: [phone] Between 7am to 7pm - call Pager - [phone]  After 7pm go to [email or web address] - password [email or web address]  Call night coverage person covering after 7pm

## 2015-12-18 NOTE — Care Management Important Message (Signed)
Important Message  Patient Details  Name: Andre Wilson MRN: [id number] Date of Birth: Nov 06, 1935   Medicare Important Message Given:  Yes    Nimra Puccinelli P Chason Mciver 12/18/2015, 3:00 PM

## 2015-12-18 NOTE — Progress Notes (Signed)
Patient Name: Andre Wilson Date of Encounter: 12/18/2015  Primary Cardiologist: [facility] in [place], [place]   80 yo male with PMH of CAD s/p CABG, HTN, HLD with last known echo on 10/03/2014 demonstrated EF 50-55% presented with acute on chronic HF symptom, while admitted, in AM of 4/24, he went into VT requiring Code Blue.   Principal Problem:   Acute combined systolic and diastolic heart failure (HCC) - unsure chronicity Active Problems:   GI bleed   Hypertension   Hypercholesteremia   Coronary artery disease involving native heart with angina pectoris (HCC)   Protein-calorie malnutrition, moderate (HCC)   Elevated troponin   Sustained VT (ventricular tachycardia) (HCC)   Cardiac arrest (HCC) secondary to sustained V tach requiring CPR 12/15/15   Acute on chronic congestive heart failure (HCC)   Coronary artery disease involving autologous vein bypass graft   Moderate to severe aortic stenosis   Mitral regurgitation and aortic stenosis   Cardiomyopathy, ischemic   Essential hypertension    SUBJECTIVE  Denies any CP or SOB. Cath yesterday showed significant coronary disease but normal filling pressures.  CURRENT MEDS . aspirin  81 mg Oral Daily  . atorvastatin  40 mg Oral Daily  . carvedilol  3.125 mg Oral BID WC  .  ceFAZolin (ANCEF) IV  2 g Intravenous To SS-Surg  . clopidogrel  75 mg Oral Daily  . ezetimibe  10 mg Oral Daily  . feeding supplement (ENSURE ENLIVE)  237 mL Oral BID PC  . ferrous sulfate  325 mg Oral TID WC  . folic acid  1 mg Oral Daily  . furosemide  40 mg Oral BID  . gentamicin irrigation  80 mg Irrigation To SS-Surg  . heparin  5,000 Units Subcutaneous Q8H  . latanoprost  1 drop Both Eyes QHS  . lisinopril  2.5 mg Oral Daily  . multivitamin with minerals  1 tablet Oral Daily  . potassium chloride  20 mEq Oral BID  . ranolazine  500 mg Oral BID  . sodium chloride flush  3 mL Intravenous Q12H  . timolol  1 drop Both Eyes BID  . vitamin C   500 mg Oral BID    OBJECTIVE  Filed Vitals:   12/18/15 0300 12/18/15 0400 12/18/15 0500 12/18/15 0600  BP: 97/58 104/64 98/53 109/70  Pulse: 85 86 84 90  Temp:  97.8 F (36.6 C)    TempSrc:  Oral    Resp: Height:      Weight:   149 lb 0.5 oz (67.6 kg)   SpO2: 95% 93% 90% 93%    Intake/Output Summary (Last 24 hours) at 12/18/15 0930 Last data filed at 12/18/15 0600  Gross per 24 hour  Intake  830.7 ml  Output   1960 ml  Net -1129.3 ml   Filed Weights   12/17/15 0234 12/17/15 0800 12/18/15 0500  Weight: 150 lb 12.7 oz (68.4 kg) 150 lb 12.7 oz (68.4 kg) 149 lb 0.5 oz (67.6 kg)    PHYSICAL EXAM  General: Pleasant, NAD. Neuro: Alert and oriented X 3. Moves all extremities spontaneously. Psych: Normal affect. HEENT:  Normal  Neck: Supple without bruits +minimal JVD. Lungs:  Resp regular and unlabored, CTA Heart: RRR no s3, s4. +2/6 systolic murmur Abdomen: Soft, non-tender, non-distended, BS + x 4.  Extremities: No clubbing, cyanosis. DP/PT/Radials 2+ and equal bilaterally. No significant LE pitting edema  Accessory Clinical Findings  CBC  Recent Labs  12/17/15  0240 12/17/15 1814  WBC 13.0* 11.0*  HGB 12.0* 11.7*  HCT 37.0* 36.0*  MCV 85.5 85.1  PLT 309 293   Basic Metabolic Panel  Recent Labs  12/16/15 0511 12/17/15 0240 12/17/15 1814  NA 139 137  --   K 3.6 4.0  --   CL 104 102  --   CO2 24 24  --   GLUCOSE 115* 110*  --   BUN 15 18  --   CREATININE 1.25* 1.33* 1.37*  CALCIUM 8.9 9.0  --   MG 1.9 1.8  --    Liver Function Tests  Recent Labs  12/15/15 1514 12/16/15 0511  AST 43* 25  ALT 60 41  ALKPHOS 52 50  BILITOT 0.7 0.8  PROT 6.5 5.9*  ALBUMIN 3.0* 2.7*   Cardiac Enzymes  Recent Labs  12/15/15 1514 12/15/15 2112  TROPONINI 0.08* 0.08*   Thyroid Function Tests  Recent Labs  12/16/15 0511  TSH 2.521    TELE NSR without significant ventricular ectopy, longest run of PVCs 3 beats    ECG  No new  EKG  Echocardiogram 12/15/2015  LV EF: 25% - 30%  ------------------------------------------------------------------- Indications: CHF - 428.0.  ------------------------------------------------------------------- History: PMH: Coronary artery disease. Risk factors: Hypertension. Hypercholesterolemia.  ------------------------------------------------------------------- Study Conclusions  - Left ventricle: The cavity size was moderately dilated. Wall  thickness was normal. Systolic function was severely reduced. The  estimated ejection fraction was in the range of 25% to 30%.  Diffuse hypokinesis. There is akinesis of the inferolateral  myocardium. Doppler parameters are consistent with restrictive  physiology, indicative of decreased left ventricular diastolic  compliance and/or increased left atrial pressure. Doppler  parameters are consistent with high ventricular filling pressure. - Aortic valve: Valve mobility was restricted. There was severe  stenosis. There was mild regurgitation. Valve area (VTI): 0.79  cm^2. Valve area (Vmax): 0.75 cm^2. Valve area (Vmean): 0.74  cm^2. - Mitral valve: There was moderate to severe regurgitation. - Left atrium: The atrium was mildly dilated. - Right ventricle: Systolic function was moderately reduced. - Pulmonary arteries: Systolic pressure was mildly increased. PA  peak pressure: 33 mm Hg (S).  Impressions:  - Global hypokinesis with akinesis of the inferolateral wall;  overall severely reduced LV function; restrictive filling with  elevated LV filling pressure; moderate LVE; mild LAE; moderately  reduced RV function; calcified aortic valve with fusion of right  and left cusps; severe AS by continuity equation (AVA 0.75 cm2);  however this appears likely to be overestimated as mean gradient  10 mmHg and dimensionless index 0.39 not supportive of severe AS;  mild AI; moderate to severe MR; trace TR  with mildly elevated  pulmonary pressure.    Radiology/Studies  Dg Chest 2 View  12/13/2015  CLINICAL DATA:  Worsening shortness breath and hypoxia. Recent pneumonia, GI bleeding, myocardial infarction. EXAM: CHEST  2 VIEW COMPARISON:  None. FINDINGS: The heart size and mediastinal contours are within normal limits. Pulmonary hyperinflation and coarsening of interstitial lung markings is consistent with COPD. No evidence of pulmonary consolidation or frank pulmonary edema. No evidence of pneumothorax or pleural effusion. Prior CABG noted. IMPRESSION: COPD.  No acute findings. Electronically Signed   By: Myles Rosenthal M.D.   On: 12/13/2015 16:12   Dg Chest Port 1 View  12/15/2015  CLINICAL DATA:  Chest pain EXAM: PORTABLE CHEST 1 VIEW COMPARISON:  12/13/2015 FINDINGS: There is hyperinflation of the lungs compatible with COPD. Mild peribronchial thickening. Heart is upper limits normal in  size. No confluent opacities or effusions. No acute bony abnormality. IMPRESSION: COPD/bronchitic changes. Electronically Signed   By: Charlett NoseKevin  Dover M.D.   On: 12/15/2015 10:20    ASSESSMENT AND PLAN  1. Acute on chronic combined systolic and diastolic HF with acute drop in LV function  - Echo 10/03/14 demonstrated EF 50-55% with aortic stenosis  - Echo 12/15/2015 EF 25-30%, diffuse hypokinesis, akinesis of inferolateral myocardium, severe AS with mean valve area 0.74, moderate to severe MR, moderately reduced RVEF, PA peak pressure 33mmHg  - Left and right heart cath showed significant but non-interventable CAD and normal filling pressures    2. Possible severe AS and severe MR: see above  3. VT arrest in AM of 12/15/2015  - plan for ICD today. Discussed risks and benefits of the procedure.  Risks include bleeding infection, tamponade, pneumothorax.  The patient understands the risks and has agreed to the procedure.    -will switch to PO amiodarone post ICD placement  4. CAD s/p CABG  Will M. Camnitz  MD 12/18/2015 9:30 AM

## 2015-12-18 NOTE — Progress Notes (Signed)
PT Cancellation Note  Patient Details Name: Andre Wilson MRN: [id number] DOB: Jun 14, 1936   Cancelled Treatment:    Reason Eval/Treat Not Completed: Patient at procedure or test/unavailable (per conversation w/ RN, pt for ICD placement today).  PT will continue to follow acutely.  Encarnacion ChuAshley Ramiyah Wilson PT, DPT  Pager: [phone] Phone: [phone] 12/18/2015, 10:09 AM

## 2015-12-18 NOTE — Progress Notes (Signed)
Patient Name: Andre Wilson Date of Encounter: 12/18/2015  Hospital Problem List     Principal Problem:   Acute combined systolic and diastolic heart failure (HCC) - unsure chronicity Active Problems:   Coronary artery disease involving native heart with angina pectoris (HCC)   Sustained VT (ventricular tachycardia) (HCC)   Cardiac arrest (HCC) secondary to sustained V tach requiring CPR 12/15/15   Acute on chronic congestive heart failure (HCC)   Coronary artery disease involving autologous vein bypass graft   Moderate to severe aortic stenosis   Mitral regurgitation and aortic stenosis   Cardiomyopathy, ischemic   GI bleed   Hypertension   Hypercholesteremia   Protein-calorie malnutrition, moderate (HCC)   Elevated troponin   Essential hypertension    Subjective   Continues to feel well today - ready for ICD procedure No chest pain or significant dyspnea. He doesn't notice any irregularities in his heartbeat.  Inpatient Medications    . aspirin  81 mg Oral Daily  . atorvastatin  40 mg Oral Daily  . carvedilol  3.125 mg Oral BID WC  .  ceFAZolin (ANCEF) IV  2 g Intravenous To SS-Surg  . clopidogrel  75 mg Oral Daily  . ezetimibe  10 mg Oral Daily  . feeding supplement (ENSURE ENLIVE)  237 mL Oral BID PC  . ferrous sulfate  325 mg Oral TID WC  . folic acid  1 mg Oral Daily  . furosemide  40 mg Oral BID  . gentamicin irrigation  80 mg Irrigation To SS-Surg  . heparin  5,000 Units Subcutaneous Q8H  . latanoprost  1 drop Both Eyes QHS  . lisinopril  2.5 mg Oral Daily  . multivitamin with minerals  1 tablet Oral Daily  . potassium chloride  20 mEq Oral BID  . ranolazine  500 mg Oral BID  . sodium chloride flush  3 mL Intravenous Q12H  . timolol  1 drop Both Eyes BID  . vitamin C  500 mg Oral BID    Vital Signs    Filed Vitals:   12/18/15 0300 12/18/15 0400 12/18/15 0500 12/18/15 0600  BP: 97/58 104/64 98/53 109/70  Pulse: 85 86 84 90  Temp:  97.8 F (36.6 C)     TempSrc:  Oral    Resp: Height:      Weight:   149 lb 0.5 oz (67.6 kg)   SpO2: 95% 93% 90% 93%    Intake/Output Summary (Last 24 hours) at 12/18/15 0810 Last data filed at 12/18/15 0600  Gross per 24 hour  Intake  847.4 ml  Output   1960 ml  Net -1112.6 ml   Filed Weights   12/17/15 0234 12/17/15 0800 12/18/15 0500  Weight: 150 lb 12.7 oz (68.4 kg) 150 lb 12.7 oz (68.4 kg) 149 lb 0.5 oz (67.6 kg)    Physical Exam    General: Pleasant, NAD.  Feels well Neuro: Alert and oriented X 3. Moves all extremities spontaneously. Psych: Normal affect. HEENT:  [place]/AT, EOMI.   Neck: Supple without bruits; minimal JVD. - radiated AS murmr Lungs:  Resp regular and unlabored, CTA. Heart: RRR, Nl S1 & S2.  No obvious gallops or rubs, high pitched/squeaking 1-2/6 SEM at RUSB as well as low pitch, blowing 3/6 HSM at left sternal border. no s3, s4, or murmurs. Abdomen: Soft, non-tender, non-distended, BS + x 4.  Extremities: No clubbing, cyanosis or edema. Radials 2+ and equal bilaterally. Significantly reduced lower extremity pulses.  Labs    CBC  Recent Labs  12/17/15 0240 12/17/15 1814  WBC 13.0* 11.0*  HGB 12.0* 11.7*  HCT 37.0* 36.0*  MCV 85.5 85.1  PLT 309 293   Basic Metabolic Panel  Recent Labs  12/16/15 0511 12/17/15 0240 12/17/15 1814  NA 139 137  --   K 3.6 4.0  --   CL 104 102  --   CO2 24 24  --   GLUCOSE 115* 110*  --   BUN 15 18  --   CREATININE 1.25* 1.33* 1.37*  CALCIUM 8.9 9.0  --   MG 1.9 1.8  --    Liver Function Tests  Recent Labs  12/15/15 1514 12/16/15 0511  AST 43* 25  ALT 60 41  ALKPHOS 52 50  BILITOT 0.7 0.8  PROT 6.5 5.9*  ALBUMIN 3.0* 2.7*   No results for input(s): LIPASE, AMYLASE in the last 72 hours. Cardiac Enzymes  Recent Labs  12/15/15 0907 12/15/15 1514 12/15/15 2112  TROPONINI 0.09* 0.08* 0.08*   BNP Invalid input(s): POCBNP D-Dimer No results for input(s): DDIMER in the last 72 hours. Hemoglobin  A1C No results for input(s): HGBA1C in the last 72 hours. Fasting Lipid Panel No results for input(s): CHOL, HDL, LDLCALC, TRIG, CHOLHDL, LDLDIRECT in the last 72 hours. Thyroid Function Tests  Recent Labs  12/16/15 0511  TSH 2.521    Telemetry    NSR with minimal PVCs, No VT.  Rate 80s   ECG    No EKG today    Cardiology    Echo 4/24: Severely reduced EF, 25-30% with diffuse HK. AK of inferolateral wall grade 3 DD (restrictive physiology) - with elevated LVEDP. (Moderate-) Severe aortic stenosis (fusion of right and left cusps, calculation does not suggest severe stenosis). Moderate to severe MR. Moderate RV dysfunction with mildly increased PA Pressures.   R&LHC 4/26: Conclusion    1. Prox LAD to Mid LAD lesion, 100% stenosed. 2. Ost Cx to Prox Cx lesion, 100% stenosed. 3. Acute Mrg lesion, 70% stenosed. 4. Mid RCA lesion, 70% stenosed. 5. LIMA was injected is normal in caliber, and is anatomically normal.Dist LAD lesion, 75% stenosed @ anastomosis. 6. SVG-DIAG, SVG-OM, SVG-rPDA 100%  7. Aortic Valve gradient not consistent with Severe AS. (able to cross valve relatively easily)   Known occlusion of 3 saphenous vein grafts  Patent LIMA to LAD with a distal 70-75% anastomosis stenosis of the LAD. Lesion is calcified - not PCI amenable.  Total occlusion of the LAD, circumflex, and diffuse ratty disease in the RCA.  Severe left ventricular systolic dysfunction with estimated EF 25%.  Mildly elevated pulmonary artery pressures with normal LV filling pressure.  RAP/mean  5/3/3 mmHg); RVP/EDP 41/1/6 mmHg  PAP/mean 42/14/25 mmHg; PCWP ~15 mmHg  AoP/mean  84/49/64 mmHg; LVP/EDP  90/2/15 mmHg Fick Cardiac Output  3.82 L/min Unable to calculate TD  Fick Cardiac Output Index  2.09 (L/min)/BSA    QP/QS  1 1   TPVR Index 11.96 HRUI TSVR Index 30.63 HRUI  PVR SVR Ratio 0.16 TPVR/TSVR Ratio 0.39       Radiology   No new studies Dg Chest 2 View  12/13/2015   CLINICAL DATA:  Worsening shortness breath and hypoxia. Recent pneumonia, GI bleeding, myocardial infarction. EXAM: CHEST  2 VIEW COMPARISON:  None. FINDINGS: The heart size and mediastinal contours are within normal limits. Pulmonary hyperinflation and coarsening of interstitial lung markings is consistent with COPD. No evidence of pulmonary consolidation or frank pulmonary  edema. No evidence of pneumothorax or pleural effusion. Prior CABG noted. IMPRESSION: COPD.  No acute findings. Electronically Signed   By: Myles Rosenthal M.D.   On: 12/13/2015 16:12   Dg Chest Port 1 View  12/15/2015  CLINICAL DATA:  Chest pain EXAM: PORTABLE CHEST 1 VIEW COMPARISON:  12/13/2015 FINDINGS: There is hyperinflation of the lungs compatible with COPD. Mild peribronchial thickening. Heart is upper limits normal in size. No confluent opacities or effusions. No acute bony abnormality. IMPRESSION: COPD/bronchitic changes. Electronically Signed   By: Charlett Nose M.D.   On: 12/15/2015 10:20    Assessment & Plan    Principal Problem:   Acute (? On chronic) combined systolic and diastolic heart failure (HCC) - unsure chronicity  RHC #s would confirm that he is essentially Euovolemic - Continue with PO lasix.  I think he should be ready for d/c tomorrow post ICD.  Unable to further titrate BB or ACE-I due to borderline pressures.   I will arrange for him to be followed up in the heart failure clinic due to the complex nature of his heart failure and valvular disease. He may very well need an outpatient TEE to better assess his valves. Will need close follow-up this is probably better done in the heart failure clinic. I discussed it with Dr. Shirlee Latch. He'll be looking for the right and left heart cath results.  Active Problems:   Coronary artery disease involving native heart with angina pectoris (HCC) /Coronary artery disease involving autologous vein bypass graft  - only remaining conduit is LIMA-dLAD with anastomotic ~70%  lesion -- not any PCI option.  He remains on aspirin, statin as well as Plavix plus low-dose carvedilol and lisinopril. Blood pressure is borderline. Unable to further titrate.    Sustained VT (ventricular tachycardia) (HCC) --  Cardiac arrest (HCC) secondary to sustained V tach requiring CPR 12/15/15  On amiodarone. No further events. - We'll probably convert to oral amiodarone tomorrow post ICD  Plan is to confirm no other cause and then likely ICD placement today       Mitral regurgitation and aortic stenosis (Moderate to severe aortic stenosis) - prior echo suggested moderate aortic stenosis. This is really difficult to tell with low ejection fraction, but visually the valve does seem to be moving at least in the non-coronary cusp.  R&LHC #s would suggest that the AS is not Severe & MR is not causing notable increase in PCWP ? LVEDP (both of which are only mildly elevated).  I suspect that the MR is combined Ischemic with tethered posterior MV leaflet & due to dilated LV annulus.      Cardiomyopathy, ischemic -- by cardiac catheterization the past he has a patent LIMA but other vein grafts and native circles. -- He has severe native coronary disease. -- Apparently new drop in EF from November of last year. This is despite having no acute event. Most likely cause is steady progression of disease with now no longer having any RCA or circumflex perfusion. There is also the compounding factor of valvular disease.  Essentially minimal collateral flow from LIMA-LAD to other vessel beds.  There are some R-R bridging collaterals but minimal flow to the PDA/PL system. No flow to the Cx-OM system.  The picture would suggest retrograde flow filling a Diag - this is trace only.  No PCI targets    GI bleed - from March 2017 this led to mild troponin elevation. I don't think this was a inciting event for  this drop in ejection fraction.   Hypertension - not truly an issue. He is on low-dose ACE inhibitor  and carvedilol that cannot be further titrated due to borderline pressures.   Hypercholesteremia - on statin   Protein-calorie malnutrition, moderate (HCC) - defer to primary physician team   Elevated troponin - likely demand ischemia from reduced EF. (LV wall stress related from chronic cardiomyopathy)   Plan: for right left heart catheterization today and likely ICD tomorrow.  -- Would expect a good likely be discharged following his ICD placement as early as Friday. Will arrange for follow-up in the heart failure clinic. -- Further evaluation of his valvular disease can be done in the outpatient setting.  I suspect that he is probably stable to move to a cardiac stepdown unit either in TCU or 3 W. if ICU bed availability requires it   Signed, Herbie Baltimore, Piedad Climes, M.D., M.S. Interventional Cardiologist   Pager # [phone] Phone # [phone] [address] [place], [place] [address]

## 2015-12-19 ENCOUNTER — Inpatient Hospital Stay (HOSPITAL_COMMUNITY): Payer: Medicare Other

## 2015-12-19 ENCOUNTER — Encounter (HOSPITAL_COMMUNITY): Payer: Self-pay | Admitting: Cardiology

## 2015-12-19 LAB — CBC
HCT: 33.4 % — ABNORMAL LOW (ref 39.0–52.0)
Hemoglobin: 10.9 g/dL — ABNORMAL LOW (ref 13.0–17.0)
MCH: 27.9 pg (ref 26.0–34.0)
MCHC: 32.6 g/dL (ref 30.0–36.0)
MCV: 85.4 fL (ref 78.0–100.0)
PLATELETS: 261 10*3/uL (ref 150–400)
RBC: 3.91 MIL/uL — ABNORMAL LOW (ref 4.22–5.81)
RDW: 14.7 % (ref 11.5–15.5)
WBC: 10.6 10*3/uL — AB (ref 4.0–10.5)

## 2015-12-19 LAB — BASIC METABOLIC PANEL
Anion gap: 9 (ref 5–15)
BUN: 19 mg/dL (ref 6–20)
CALCIUM: 8.9 mg/dL (ref 8.9–10.3)
CO2: 25 mmol/L (ref 22–32)
CREATININE: 1.52 mg/dL — AB (ref 0.61–1.24)
Chloride: 102 mmol/L (ref 101–111)
GFR calc Af Amer: 49 mL/min — ABNORMAL LOW (ref >60)
GFR, EST NON AFRICAN AMERICAN: 42 mL/min — AB (ref >60)
Glucose, Bld: 106 mg/dL — ABNORMAL HIGH (ref 65–99)
Potassium: 5 mmol/L (ref 3.5–5.1)
SODIUM: 136 mmol/L (ref 135–145)

## 2015-12-19 LAB — MAGNESIUM: Magnesium: 2 mg/dL (ref 1.7–2.4)

## 2015-12-19 MED ORDER — OXYCODONE-ACETAMINOPHEN 5-325 MG PO TABS: 1.0000 | ORAL_TABLET | Freq: Four times a day (QID) | ORAL | Status: DC | PRN

## 2015-12-19 MED ORDER — OXYCODONE-ACETAMINOPHEN 5-325 MG PO TABS
1.0000 | ORAL_TABLET | Freq: Four times a day (QID) | ORAL | Status: DC | PRN
  Administered 2015-12-19: 1 via ORAL
  Filled 2015-12-19 (×2): qty 1

## 2015-12-19 MED ORDER — ONDANSETRON HCL 4 MG PO TABS: 4.0000 mg | ORAL_TABLET | Freq: Three times a day (TID) | ORAL | Status: DC | PRN

## 2015-12-19 MED ORDER — AMIODARONE HCL 200 MG PO TABS
400.0000 mg | ORAL_TABLET | Freq: Every day | ORAL | Status: DC
  Administered 2015-12-19: 400 mg via ORAL
  Filled 2015-12-19: qty 2

## 2015-12-19 MED FILL — Sodium Chloride Irrigation Soln 0.9%: Qty: 500 | Status: AC

## 2015-12-19 MED FILL — Gentamicin Sulfate Inj 40 MG/ML: INTRAMUSCULAR | Qty: 2 | Status: AC

## 2015-12-19 MED FILL — Heparin Sodium (Porcine) 2 Unit/ML in Sodium Chloride 0.9%: INTRAMUSCULAR | Qty: 500 | Status: AC

## 2015-12-19 NOTE — Discharge Instructions (Signed)
° ° °  Supplemental Discharge Instructions for  Pacemaker/Defibrillator Patients  Activity No heavy lifting or vigorous activity with your left/right arm for 6 to 8 weeks.  Do not raise your left/right arm above your head for one week.  Gradually raise your affected arm as drawn below.           __          12/23/15                      12/24/15                  12/25/15                        12/26/15  NO DRIVING for   6 months      WOUND CARE - Keep the wound area clean and dry.  Do not get this area wet for one week. No showers for one week; you may shower on  12/26/15   . - The tape/steri-strips on your wound will fall off; do not pull them off.  No bandage is needed on the site.  DO  NOT apply any creams, oils, or ointments to the wound area. - If you notice any drainage or discharge from the wound, any swelling or bruising at the site, or you develop a fever > 101? F after you are discharged home, call the office at once.  Special Instructions - You are still able to use cellular telephones; use the ear opposite the side where you have your pacemaker/defibrillator.  Avoid carrying your cellular phone near your device. - When traveling through airports, show security personnel your identification card to avoid being screened in the metal detectors.  Ask the security personnel to use the hand wand. - Avoid arc welding equipment, MRI testing (magnetic resonance imaging), TENS units (transcutaneous nerve stimulators).  Call the office for questions about other devices. - Avoid electrical appliances that are in poor condition or are not properly grounded. - Microwave ovens are safe to be near or to operate.  Additional information for defibrillator patients should your device go off: - If your device goes off ONCE and you feel fine afterward, notify the device clinic nurses. - If your device goes off ONCE and you do not feel well afterward, call 911. - If your device goes off TWICE, call 911. - If  your device goes off THREE times in one day, call 911.  DO NOT DRIVE YOURSELF OR A FAMILY MEMBER WITH A DEFIBRILLATOR TO THE HOSPITAL--CALL 911.

## 2015-12-19 NOTE — Progress Notes (Signed)
Report received in patient's room via [place] RN using [employer] format, reviewed orders, labs, VS, meds and patient's general condition, assumed care of the patient

## 2015-12-19 NOTE — Progress Notes (Signed)
[facility]                                                                              Patient Demographics  Andre Wilson, is a 80 y.o. male, DOB - 05/21/1936, ZOX:[id number]  Admit date - 12/13/2015   Admitting Physician Lorretta Harp, MD  Outpatient Primary MD for the patient is No primary care provider on file.  Outpatient specialists: Cardiologist at [facility] and in [place]   LOS - 6  days    Chief Complaint  Patient presents with  . Shortness of Breath       Brief Summary   Andre Wilson is a 80 y.o. male with medical history significant of hypertension, hyperlipidemia, GI bleeding, CAD, myocardial infarction, S/P of CABG 1987, GI bleeding, who presented with shortness of breath and bilateral ankle edema for past several days PTA, which had been progressively getting worse. He had mild dry cough, but no chest pain, fever or chills. Patient was admitted for CHF exacerbation. After being admitted to the hospital he was being treated for CHF, his echocardiogram was pending, he was close to his baseline on 12/15/2015 morning, however around 8:45 AM on 12/15/2015 patient went into a prolonged V. tach rhythm and became briefly unresponsive, he never lost his pulse, he was given IV magnesium, placed on IV amiodarone, stat echogram and cardiology consult was obtained and he was transferred to stepdown/ICU status. Cardiology planning cardiac cath and ICD.     Assessment & Plan    Principal Problem:  Acute on chronic Combined systolic and diastolic CHF -  last EF more than one year ago was 55% due to ischemic cardiomyopathy EF now 25% -30% with diffuse hypokinesis, akinesis of inferior lateral myocardium likely due to new sustained monomorphic V. tach .  -Cardiology following, positive balance of 531cc - Cardiac cath 4/26 showed no PCI targets, essentially minimal collateral flow from LIMA-LAD with a vessel beds, see detailed report below.   VT arrest on 12/15/15  -  On 12/15/2015, patient had prolonged run of sustained monomorphic V. tach and became briefly unresponsive, CPR < 1 min, had pulse immediately, no shocks. Cards and EP following. - s/p ICD placement 4/27, stable from EP standpoint - Continue aspirin, Plavix, amiodarone, Coreg, ACE inhibitor - Per cardiology transitioned to oral amiodarone    CAD with elevated troponins possibly due to demand ischemia from CHF and VT arrest - chest pain-free, continue aspirin, Coreg, statin, Zetia, Plavix and Ranexa for secondary prevention.    Moderate to severe MR and AS - Follow cardiology recommendations  Dyslipidemia. -  On statin and Zetia..   Moderate  protein calorie malnutrition  - Nutrition consult, continue nutrition supplements   Essential hypertension.  - Discontinued Norvasc, currently on Coreg, ACE inhibitor will monitor blood pressure.  H/O Diverticular bleed - stable.  Generalized debility -  PT OT evaluation-> feeling very dizzy and weak   Code Status: full code  Family Communication: Discussed in detail with the patient, all imaging results, lab results explained to the patient   Disposition Plan:  Continue to monitor today, continue physical therapy, hopefully DC in a.m. if feeling  better   Time Spent in minutes  15 minutes  Procedures  2-D echocardiogram Global hypokinesis with akinesis of the inferolateral wall;overall severely reduced LV function; restrictive filling with  elevated LV filling pressure; moderate LVE; mild LAE; moderatelyreduced RV function; calcified aortic valve with fusion of right and left cusps; severe AS by continuity equation (AVA 0.75 cm2); however this appears likely to be overestimated as mean gradient 10 mmHg and dimensionless index 0.39 not supportive of severe AS; mild AI; moderate to severe MR; trace TR with mildly elevated pulmonary pressure.   Consults   Cardiology  EP   DVT Prophylaxis  heparin  Medications  Scheduled Meds: .  amiodarone  400 mg Oral Daily  . aspirin  81 mg Oral Daily  . atorvastatin  40 mg Oral Daily  . carvedilol  3.125 mg Oral BID WC  . clopidogrel  75 mg Oral Daily  . ezetimibe  10 mg Oral Daily  . feeding supplement (ENSURE ENLIVE)  237 mL Oral BID PC  . ferrous sulfate  325 mg Oral TID WC  . folic acid  1 mg Oral Daily  . furosemide  40 mg Oral BID  . heparin  5,000 Units Subcutaneous Q8H  . latanoprost  1 drop Both Eyes QHS  . lisinopril  2.5 mg Oral Daily  . multivitamin with minerals  1 tablet Oral Daily  . ranolazine  500 mg Oral BID  . sodium chloride flush  3 mL Intravenous Q12H  . timolol  1 drop Both Eyes BID  . vitamin C  500 mg Oral BID   Continuous Infusions:   PRN Meds:.sodium chloride, acetaminophen, linaclotide, morphine injection, nitroGLYCERIN, ondansetron (ZOFRAN) IV, oxyCODONE-acetaminophen, sodium chloride flush, zolpidem   Antibiotics   Anti-infectives    Start     Dose/Rate Route Frequency Ordered Stop   12/18/15 1800  ceFAZolin (ANCEF) IVPB 1 g/50 mL premix     1 g 100 mL/hr over 30 Minutes Intravenous Every 6 hours 12/18/15 1446 12/19/15 0030   12/18/15 1130  gentamicin (GARAMYCIN) 80 mg in sodium chloride irrigation 0.9 % 500 mL irrigation     80 mg Irrigation To ShortStay Surgical 12/17/15 1932 12/18/15 1340   12/18/15 1130  ceFAZolin (ANCEF) IVPB 2g/100 mL premix     2 g 200 mL/hr over 30 Minutes Intravenous To ShortStay Surgical 12/17/15 1932 12/18/15 1300        Subjective:   Andre Wilson was seen and examined today. No specific complaints. Overall weak And feeling dizzy. Patient denies  chest pain, shortness of breath, abdominal pain, N/V/D/C, new weakness, numbess, tingling. No acute events overnight.    Objective:   Filed Vitals:   12/19/15 0356 12/19/15 0851 12/19/15 0853 12/19/15 1130  BP: 103/65 109/54  95/58  Pulse: 73 74    Temp:  97.9 F (36.6 C)    TempSrc:  Oral    Resp: 14 18    Height:      Weight:      SpO2: 98% 99%  99%     Intake/Output Summary (Last 24 hours) at 12/19/15 1203 Last data filed at 12/19/15 0858  Gross per 24 hour  Intake 1210.4 ml  Output   1875 ml  Net -664.6 ml     Wt Readings from Last 3 Encounters:  12/19/15 67.178 kg (148 lb 1.6 oz)     Exam  General: Alert and oriented x 3, NAD  HEENT:   Neck: Supple, no JVD  CVS: S1 S2 auscultated,  2/6 SEM. Regular rate and rhythm.  Respiratory: CTAB, no wheezing , ICD in the left chest wall  Abdomen: Soft, nontender, nondistended, + bowel sounds  Ext: no cyanosis clubbing or edema  Neuro: no new deficits   Skin: No rashes  Psych: Normal affect and demeanor, alert and oriented x3    Data Reviewed:  I have personally reviewed following labs and imaging studies  Micro Results Recent Results (from the past 240 hour(s))  MRSA PCR Screening     Status: None   Collection Time: 12/15/15  9:51 AM  Result Value Ref Range Status   MRSA by PCR NEGATIVE NEGATIVE Final    Comment:        The GeneXpert MRSA Assay (FDA approved for NASAL specimens only), is one component of a comprehensive MRSA colonization surveillance program. It is not intended to diagnose MRSA infection nor to guide or monitor treatment for MRSA infections.     Radiology Reports Dg Chest 2 View  12/19/2015  CLINICAL DATA:  Status post defibrillator placement EXAM: CHEST  2 VIEW COMPARISON:  12/15/2015 FINDINGS: Cardiac shadow is within normal limits. The lungs are well aerated bilaterally. Some patchy left-sided infiltrate is seen projecting in the superior segment of the left lower lobe. Bilateral pleural effusions are noted. No pneumothorax is seen. IMPRESSION: New left lower lobe infiltrate. Defibrillator device without pneumothorax. Small bilateral pleural effusions. Electronically Signed   By: Alcide Clever M.D.   On: 12/19/2015 07:48   Dg Chest 2 View  12/13/2015  CLINICAL DATA:  Worsening shortness breath and hypoxia. Recent pneumonia, GI  bleeding, myocardial infarction. EXAM: CHEST  2 VIEW COMPARISON:  None. FINDINGS: The heart size and mediastinal contours are within normal limits. Pulmonary hyperinflation and coarsening of interstitial lung markings is consistent with COPD. No evidence of pulmonary consolidation or frank pulmonary edema. No evidence of pneumothorax or pleural effusion. Prior CABG noted. IMPRESSION: COPD.  No acute findings. Electronically Signed   By: Myles Rosenthal M.D.   On: 12/13/2015 16:12   Dg Chest Port 1 View  12/15/2015  CLINICAL DATA:  Chest pain EXAM: PORTABLE CHEST 1 VIEW COMPARISON:  12/13/2015 FINDINGS: There is hyperinflation of the lungs compatible with COPD. Mild peribronchial thickening. Heart is upper limits normal in size. No confluent opacities or effusions. No acute bony abnormality. IMPRESSION: COPD/bronchitic changes. Electronically Signed   By: Charlett Nose M.D.   On: 12/15/2015 10:20    CBC  Recent Labs Lab 12/13/15 1552 12/14/15 0240 12/15/15 0907 12/17/15 0240 12/17/15 1814 12/19/15 0528  WBC 11.3* 8.7 10.7* 13.0* 11.0* 10.6*  HGB 11.7* 11.0* 12.3* 12.0* 11.7* 10.9*  HCT 36.5* 34.0* 37.1* 37.0* 36.0* 33.4*  PLT 279 272 325 309 293 261  MCV 86.1 86.3 86.9 85.5 85.1 85.4  MCH 27.6 27.9 28.8 27.7 27.7 27.9  MCHC 32.1 32.4 33.2 32.4 32.5 32.6  RDW 14.6 14.6 14.8 14.7 14.8 14.7  LYMPHSABS 1.5  --   --   --   --   --   MONOABS 0.9  --   --   --   --   --   EOSABS 0.1  --   --   --   --   --   BASOSABS 0.0  --   --   --   --   --     Chemistries   Recent Labs Lab 12/13/15 1552  12/15/15 1514 12/16/15 0511 12/17/15 0240 12/17/15 1814 12/18/15 0835 12/19/15 0528  NA 141  < >  135 139 137  --  138 136  K 3.9  < > 3.9 3.6 4.0  --  4.7 5.0  CL 107  < > 97* 104 102  --  104 102  CO2 22  < > 24 24 24   --  24 25  GLUCOSE 104*  < > 370* 115* 110*  --  109* 106*  BUN 11  < > 15 15 18   --  19 19  CREATININE 1.16  < > 1.39* 1.25* 1.33* 1.37* 1.32* 1.52*  CALCIUM 9.1  < > 8.7* 8.9  9.0  --  9.0 8.9  MG  --   < > 1.9 1.9 1.8  --  1.9 2.0  AST 19  --  43* 25  --   --   --   --   ALT 22  --  60 41  --   --   --   --   ALKPHOS 54  --  52 50  --   --   --   --   BILITOT 0.9  --  0.7 0.8  --   --   --   --   < > = values in this interval not displayed. ------------------------------------------------------------------------------------------------------------------ estimated creatinine clearance is 38.7 mL/min (by C-G formula based on Cr of 1.52). ------------------------------------------------------------------------------------------------------------------ No results for input(s): HGBA1C in the last 72 hours. ------------------------------------------------------------------------------------------------------------------ No results for input(s): CHOL, HDL, LDLCALC, TRIG, CHOLHDL, LDLDIRECT in the last 72 hours. ------------------------------------------------------------------------------------------------------------------ No results for input(s): TSH, T4TOTAL, T3FREE, THYROIDAB in the last 72 hours.  Invalid input(s): FREET3 ------------------------------------------------------------------------------------------------------------------ No results for input(s): VITAMINB12, FOLATE, FERRITIN, TIBC, IRON, RETICCTPCT in the last 72 hours.  Coagulation profile  Recent Labs Lab 12/13/15 2100 12/17/15 0240  INR 1.16 1.15    No results for input(s): DDIMER in the last 72 hours.  Cardiac Enzymes  Recent Labs Lab 12/15/15 0907 12/15/15 1514 12/15/15 2112  TROPONINI 0.09* 0.08* 0.08*   ------------------------------------------------------------------------------------------------------------------ Invalid input(s): POCBNP  No results for input(s): GLUCAP in the last 72 hours.   RAI,RIPUDEEP M.D. [facility] 12/19/2015, 12:03 PM  Pager: [phone] Between 7am to 7pm - call Pager - [phone]  After 7pm go to [email or web address] - password [email or web address]  Call  night coverage person covering after 7pm

## 2015-12-19 NOTE — Progress Notes (Signed)
Occupational Therapy Treatment Patient Details Name: Andre Wilson MRN: [id number] DOB: 04-14-1936 Today's Date: 12/19/2015    History of present illness Milon Dethloff is a 80 y.o. male with medical history significant of hypertension, hyperlipidemia, GI bleeding, CAD, myocardial infarction, S/P of CABG 1987, GI bleeding, who presents with shortness of breath and bilateral ankle edema, CHF exaccerbation   OT comments  Pt agreeable to getting up in chair. Required min guard assist for standing and transfer. Performed bathing, dressing and grooming primarily from a seated position with pt reporting weakness and dizziness. 02 sats in mid 90s on RA. Pt called wife to bring walker back with her from their home in [place]. Pt plans to remain in [place] for a few weeks at his daughter's home.  Follow Up Recommendations  No OT follow up    Equipment Recommendations  None recommended by OT    Recommendations for Other Services      Precautions / Restrictions Precautions Precautions: Fall       Mobility Bed Mobility Overal bed mobility: Modified Independent             General bed mobility comments: HOB up, use of rail, increased time, no physical assist  Transfers Overall transfer level: Needs assistance Equipment used: None Transfers: Sit to/from Stand;Stand Pivot Transfers Sit to Stand: Min guard Stand pivot transfers: Min guard            Balance     Sitting balance-Leahy Scale: Good       Standing balance-Leahy Scale: Poor                     ADL Overall ADL's : Needs assistance/impaired     Grooming: Wash/dry hands;Wash/dry face;Sitting;Set up   Upper Body Bathing: Set up;Sitting   Lower Body Bathing: Minimal assistance;Sit to/from stand   Upper Body Dressing : Set up;Sitting   Lower Body Dressing: Minimal assistance;Sit to/from stand   Toilet Transfer: Dealer Details (indicate cue type and reason):  simulated to chair           General ADL Comments: Pt feeling weak and dizzy, so ADL performed in sitting primarily with min guard assist for standing for safety.       Vision                     Perception     Praxis      Cognition   Behavior During Therapy: WFL for tasks assessed/performed Overall Cognitive Status: Within Functional Limits for tasks assessed                       Extremity/Trunk Assessment               Exercises     Shoulder Instructions       General Comments      Pertinent Vitals/ Pain       Pain Assessment: No/denies pain  Home Living                                          Prior Functioning/Environment              Frequency Min 2X/week     Progress Toward Goals  OT Goals(current goals can now be found in the care plan section)  Progress towards OT goals: Progressing toward goals  Acute Rehab OT Goals Patient Stated Goal: Hopes to be back home soon Time For Goal Achievement: 12/19/15 Potential to Achieve Goals: Good  Plan Discharge plan remains appropriate    Co-evaluation                 End of Session Equipment Utilized During Treatment: Gait belt   Activity Tolerance  (pt reporting dizziness and weakness, VSS)   Patient Left in chair;with call bell/phone within reach   Nurse Communication          Time: [id number] OT Time Calculation (min): 40 min  Charges: OT General Charges $OT Visit: 1 Procedure OT Treatments $Self Care/Home Management : 38-52 mins  Evern BioMayberry, Kerstie Agent Lynn 12/19/2015, 11:00 AM  [phone]

## 2015-12-19 NOTE — Progress Notes (Signed)
Physical Therapy Treatment Patient Details Name: Andre Wilson MRN: [id number] DOB: 03/13/1936 Today's Date: 12/19/2015    History of Present Illness Andre Wilson is a 80 y.o. male with medical history significant of hypertension, hyperlipidemia, GI bleeding, CAD, myocardial infarction, S/P of CABG 1987, GI bleeding, who presents with shortness of breath and bilateral ankle edema, CHF exaccerbation; VTach arrest 4/24; ICD placed 4/27    PT Comments    Attempted ambulation without O2 and dropped to 84%. With 1 L able to maintain >90%. Patient has unfortunately been off his feet for most of past 5 days due to cardiac issues/procedures. Anticipate he will progress to ultimately not need home O2, however currently qualifies. Recommend reassess home O2 needs prior to discharge.   Follow Up Recommendations  Home health PT;Supervision for mobility/OOB     Equipment Recommendations  Other (comment) (qualifies for home oxygen)    Recommendations for Other Services       Precautions / Restrictions Precautions Precautions: Fall Restrictions Weight Bearing Restrictions: No    Mobility  Bed Mobility               General bed mobility comments: up in chair  Transfers Overall transfer level: Needs assistance Equipment used: Rolling walker (2 wheeled) Transfers: Sit to/from Stand Sit to Stand: Min guard         General transfer comment: required 2-3 tries each time he stood (two times) to gain momentum to stand; proper sequencing with RW  Ambulation/Gait Ambulation/Gait assistance: Min guard Ambulation Distance (Feet): 300 Feet Assistive device: Rolling walker (2 wheeled) Gait Pattern/deviations: Step-through pattern;Decreased stride length   Gait velocity interpretation: Below normal speed for age/gender General Gait Details: vc to self-monitor for activity tolerance; decr SaO2 on RA, maintained with O2 (see separate note)   Stairs            Wheelchair  Mobility    Modified Rankin (Stroke Patients Only)       Balance           Standing balance support: No upper extremity supported Standing balance-Leahy Scale: Poor Standing balance comment: unsteady and reaching for UE support                    Cognition Arousal/Alertness: Awake/alert Behavior During Therapy: WFL for tasks assessed/performed Overall Cognitive Status: Within Functional Limits for tasks assessed                      Exercises      General Comments General comments (skin integrity, edema, etc.): Daughter present. all questions answered      Pertinent Vitals/Pain Pain Assessment: No/denies pain    Home Living Family/patient expects to be discharged to:: Private residence Living Arrangements: Spouse/significant other;Children (will stay at daughter's home; wife coming to stay too) Available Help at Discharge: Family;Available 24 hours/day Type of Home: House (daughter's home) Home Access: Level entry   Home Layout: One level Home Equipment: Bedside commode;Grab bars - tub/shower;Shower seat - built Freight forwarderin;Walker - 2 wheels (some equipment in [place]. Will have his RW) Additional Comments: Pt from out ot town. Lives in [place]; visiting daughter in [place].    Prior Function Level of Independence: Independent          PT Goals (current goals can now be found in the care plan section) Acute Rehab PT Goals Patient Stated Goal: Hopes to be back home soon Time For Goal Achievement: 12/21/15 Progress towards PT goals: Progressing toward goals  Frequency  Min 3X/week    PT Plan Discharge plan needs to be updated;Equipment recommendations need to be updated    Co-evaluation             End of Session Equipment Utilized During Treatment: Gait belt;Oxygen Activity Tolerance: Patient tolerated treatment well (with O2) Patient left: in chair;with call bell/phone within reach;with family/visitor present     Time:  1332-1401 PT Time Calculation (min) (ACUTE ONLY): 29 min  Charges:  $Gait Training: 23-37 mins                    G Codes:      Miesha Bachmann 2015/12/26, 2:18 PM Pager [phone]

## 2015-12-19 NOTE — Progress Notes (Signed)
Doing well post ICD implant Device interrogated and functioning normally CXR without ptx Wound without hematoma/ecchymosis.  Ok from EP standpoint to DC home. Follow up arranged.  Gypsy BalsamAmber Seiler, NP 12/19/2015 8:03 AM  Seen and examined.  ICD functioning appropriately.  CXR without pneumothorax.  Follow up arranged with EP clinic.  Ival Pacer Elberta Fortisamnitz, MD 12/19/2015 10:17 AM

## 2015-12-19 NOTE — Progress Notes (Signed)
SATURATION QUALIFICATIONS: (This note is used to comply with regulatory documentation for home oxygen)  Patient Saturations on Room Air at Rest = 97%  Patient Saturations on Room Air while Ambulating = 84% after walking 20 ft  Patient Saturations on 1 Liters of oxygen while Ambulating = 92%  Please briefly explain why patient needs home oxygen:  To maintain adequate O2 supply for daily activities.   12/19/2015 Veda CanningLynn Ameet Sandy, PT Pager: [phone]

## 2015-12-19 NOTE — Progress Notes (Signed)
Subjective: The patient reports dizziness and nausea  Objective: Vital signs in last 24 hours: Temp:  [97.4 F (36.3 C)-99.2 F (37.3 C)] 97.9 F (36.6 C) (04/28 0851) Pulse Rate:  [0-164] 74 (04/28 0851) Resp:  [0-42] 18 (04/28 0851) BP: (92-122)/(54-83) 95/58 mmHg (04/28 1130) SpO2:  [0 %-100 %] 99 % (04/28 0853) Weight:  [148 lb 1.6 oz (67.178 kg)] 148 lb 1.6 oz (67.178 kg) (04/28 0315) Last BM Date: 12/17/15  Intake/Output from previous day: 04/27 0701 - 04/28 0700 In: 1277.2 [P.O.:960; I.V.:267.2; IV Piggyback:50] Out: 2450 [Urine:2450] Intake/Output this shift: Total I/O In: 120 [P.O.:120] Out: 75 [Urine:75]  Medications Scheduled Meds: . aspirin  81 mg Oral Daily  . atorvastatin  40 mg Oral Daily  . carvedilol  3.125 mg Oral BID WC  . clopidogrel  75 mg Oral Daily  . ezetimibe  10 mg Oral Daily  . feeding supplement (ENSURE ENLIVE)  237 mL Oral BID PC  . ferrous sulfate  325 mg Oral TID WC  . folic acid  1 mg Oral Daily  . furosemide  40 mg Oral BID  . heparin  5,000 Units Subcutaneous Q8H  . latanoprost  1 drop Both Eyes QHS  . lisinopril  2.5 mg Oral Daily  . multivitamin with minerals  1 tablet Oral Daily  . ranolazine  500 mg Oral BID  . sodium chloride flush  3 mL Intravenous Q12H  . timolol  1 drop Both Eyes BID  . vitamin C  500 mg Oral BID   Continuous Infusions: . amiodarone 30 mg/hr (12/19/15 0058)   PRN Meds:.sodium chloride, acetaminophen, linaclotide, morphine injection, nitroGLYCERIN, ondansetron (ZOFRAN) IV, oxyCODONE-acetaminophen, sodium chloride flush, zolpidem  PE: General appearance: alert, cooperative, no distress and he appears comfortable sitting in the chair. Lungs: clear to auscultation bilaterally Heart: regular rate and rhythm and 1/6 sys MM Extremities: No LEE Pulses: 2+ and symmetric Skin: Warm and lower extremities are very dry Neurologic: Grossly normal  Lab Results:   Recent Labs  12/17/15 0240 12/17/15 1814  12/19/15 0528  WBC 13.0* 11.0* 10.6*  HGB 12.0* 11.7* 10.9*  HCT 37.0* 36.0* 33.4*  PLT 309 293 261   BMET  Recent Labs  12/17/15 0240 12/17/15 1814 12/18/15 0835 12/19/15 0528  NA 137  --  138 136  K 4.0  --  4.7 5.0  CL 102  --  104 102  CO2 24  --  24 25  GLUCOSE 110*  --  109* 106*  BUN 18  --  19 19  CREATININE 1.33* 1.37* 1.32* 1.52*  CALCIUM 9.0  --  9.0 8.9   PT/INR  Recent Labs  12/17/15 0240  LABPROT 14.9  INR 1.15       Assessment/Plan      Acute combined systolic and diastolic heart failure (HCC) - unsure chronicity Net Fluids: -1.2L/-0.6L.  EF 25-30%, Diffuse hypokinesis. There is akinesis of the inferolateral myocardium. Doppler parameters are consistent with restrictivephysiology, severe AS. Moderate to severe MR.   DC home on Lasix 40mg  BID. Daily weight monitoring and low sodium diet discussed.  On low dose ACE, Coreg 3.125 bid.  He will not tolerated aldactone at this time but may be able to decrease lasix and add later.  ADV CHF to see in the office.      GI bleed   Hypertension: stable   Hypercholesteremia: statin and zetia   Coronary artery disease involving native heart with angina pectoris (HCC) Extensive CAD and  three occluded grafts. (See cath note)  On ranexa  BID, asa, plavix, BB.     Protein-calorie malnutrition, moderate (HCC)   Elevated troponin   Sustained VT (ventricular tachycardia) (HCC)   Cardiac arrest (HCC) secondary to sustained V tach requiring CPR 12/15/15   Moderate to severe aortic stenosis   Mitral regurgitation and aortic stenosis   Cardiomyopathy, ischemic  SP ICD yesterday and seen by EP today.  I DCd IV amiodarone and started  PO daily.   Follow up valvular disease in the office.  ADV CHF clinic appt arranged.  Needs BMET in the office.  He feels dizzy and nauseated and reports being uncomfortable with going home today. He is stable from a cardiology standpoint.  BP is just a little soft.  Get up and  ambulate off O2.    LOS: 6 days    HAGER, BRYAN PA-C 12/19/2015 11:31 AM  I have seen, examined and evaluated the patient this AM along with Mr. Leron Croak.  After reviewing all the available data and chart,  I agree with his findings, examination as well as impression recommendations.  Relatively stable now from a cardiac standpoint as far as his heart failure goes. Severe ischemic cardiomyopathy with essentially only a LIMA conduit that itself is 60-70% stenosed at the anastomosis.  He had a prolonged run of VT and has not had any further since starting amiodarone. -- Per electrophysiology, amiodarone drip was converted to by mouth.  He is having some nausea, I would probably recommend 200 mg twice a day as opposed to 400 mg daily to avoid the extra dose. I have also stopped his carvedilol while he is on the higher dose of amiodarone to avoid hypotension. Once the reduces his dose, this can be started back on and the heart failure clinic.  The family is concerned about his prolonged hospitalization. I will consult physical therapy to evaluate for possible home health needs. Of also had asked care management to help considering possible home health CNA assistance.   Otherwise, he seems to be doing relatively well and stable from a cardiac medication standpoint.  I anticipate he should be ready for discharge tomorrow.    Marykay Lex, M.D., M.S. Interventional Cardiologist   Pager # [phone] Phone # [phone] [address] [place], [place] [address]

## 2015-12-20 ENCOUNTER — Encounter (HOSPITAL_COMMUNITY): Payer: Self-pay | Admitting: Physician Assistant

## 2015-12-20 LAB — CBC
HCT: 31.7 % — ABNORMAL LOW (ref 39.0–52.0)
HEMOGLOBIN: 10.1 g/dL — AB (ref 13.0–17.0)
MCH: 27.6 pg (ref 26.0–34.0)
MCHC: 31.9 g/dL (ref 30.0–36.0)
MCV: 86.6 fL (ref 78.0–100.0)
Platelets: 238 10*3/uL (ref 150–400)
RBC: 3.66 MIL/uL — AB (ref 4.22–5.81)
RDW: 14.4 % (ref 11.5–15.5)
WBC: 9.7 10*3/uL (ref 4.0–10.5)

## 2015-12-20 LAB — BASIC METABOLIC PANEL
ANION GAP: 9 (ref 5–15)
BUN: 20 mg/dL (ref 6–20)
CHLORIDE: 102 mmol/L (ref 101–111)
CO2: 25 mmol/L (ref 22–32)
Calcium: 8.7 mg/dL — ABNORMAL LOW (ref 8.9–10.3)
Creatinine, Ser: 1.42 mg/dL — ABNORMAL HIGH (ref 0.61–1.24)
GFR calc non Af Amer: 46 mL/min — ABNORMAL LOW (ref >60)
GFR, EST AFRICAN AMERICAN: 53 mL/min — AB (ref >60)
Glucose, Bld: 95 mg/dL (ref 65–99)
POTASSIUM: 4.5 mmol/L (ref 3.5–5.1)
Sodium: 136 mmol/L (ref 135–145)

## 2015-12-20 MED ORDER — AMOXICILLIN-POT CLAVULANATE 875-125 MG PO TABS: 1.0000 | ORAL_TABLET | Freq: Two times a day (BID) | ORAL | Status: DC

## 2015-12-20 MED ORDER — CARVEDILOL 3.125 MG PO TABS
3.1250 mg | ORAL_TABLET | Freq: Two times a day (BID) | ORAL | Status: DC
  Administered 2015-12-20: 3.125 mg via ORAL
  Filled 2015-12-20: qty 1

## 2015-12-20 MED ORDER — AMOXICILLIN-POT CLAVULANATE 875-125 MG PO TABS
1.0000 | ORAL_TABLET | Freq: Two times a day (BID) | ORAL | Status: DC
  Administered 2015-12-20: 1 via ORAL
  Filled 2015-12-20: qty 1

## 2015-12-20 MED ORDER — AMIODARONE HCL 200 MG PO TABS
200.0000 mg | ORAL_TABLET | Freq: Two times a day (BID) | ORAL | Status: DC
  Administered 2015-12-20: 200 mg via ORAL
  Filled 2015-12-20: qty 1

## 2015-12-20 MED ORDER — FUROSEMIDE 40 MG PO TABS: 40.0000 mg | ORAL_TABLET | Freq: Two times a day (BID) | ORAL | Status: DC

## 2015-12-20 MED ORDER — LISINOPRIL 2.5 MG PO TABS: 2.5000 mg | ORAL_TABLET | Freq: Every day | ORAL | Status: DC

## 2015-12-20 MED ORDER — ASPIRIN 81 MG PO TBEC: 81.0000 mg | DELAYED_RELEASE_TABLET | Freq: Every day | ORAL | Status: AC

## 2015-12-20 MED ORDER — OXYCODONE-ACETAMINOPHEN 5-325 MG PO TABS: 1.0000 | ORAL_TABLET | Freq: Four times a day (QID) | ORAL | Status: DC | PRN

## 2015-12-20 MED ORDER — BISACODYL 10 MG RE SUPP: 10.0000 mg | Freq: Once | RECTAL | Status: DC

## 2015-12-20 MED ORDER — POLYETHYLENE GLYCOL 3350 17 G PO PACK
17.0000 g | PACK | Freq: Every day | ORAL | Status: DC
  Administered 2015-12-20: 17 g via ORAL
  Filled 2015-12-20: qty 1

## 2015-12-20 MED ORDER — AMIODARONE HCL 200 MG PO TABS: 200.0000 mg | ORAL_TABLET | Freq: Two times a day (BID) | ORAL | Status: DC

## 2015-12-20 MED ORDER — PROMETHAZINE HCL 12.5 MG PO TABS: 12.5000 mg | ORAL_TABLET | Freq: Four times a day (QID) | ORAL | Status: DC | PRN

## 2015-12-20 NOTE — Plan of Care (Signed)
Problem: Phase I Progression Outcomes Goal: Labs/tests per orders Outcome: Completed/Met Date Met:  12/20/15 All test ordered and completed

## 2015-12-20 NOTE — Care Management (Signed)
CM spoke with patient at the bedside. He would like for his daughter to decide on his home health care provider. CM requested he notify his nurse when his daughter arrives. Discussed home O2 need/order with patient's nurse. Physicist, medicalCrystal Marthe Dant RN BSN CCM

## 2015-12-20 NOTE — Plan of Care (Signed)
Problem: Phase II Progression Outcomes Goal: Site without bleeding or hematoma Outcome: Completed/Met Date Met:  12/20/15 Patient's site remains clean, dry and intact with no bleeding or hematoma noted

## 2015-12-20 NOTE — Progress Notes (Signed)
Updated report received in patient's room via [place] RN, reviewed new orders, VS, and events of the day, assued care of patient.

## 2015-12-20 NOTE — Plan of Care (Signed)
Problem: Phase II Progression Outcomes Goal: Pain controlled Outcome: Adequate for Discharge Patient has some discomfort but the pain meds prescribed have been adequate for pain relief/control

## 2015-12-20 NOTE — Plan of Care (Signed)
Problem: Phase I Progression Outcomes Goal: Pain controlled with appropriate interventions Outcome: Progressing Patient calls for pain meds appropriately and is given meds as needed and ordered.

## 2015-12-20 NOTE — Progress Notes (Signed)
SATURATION QUALIFICATIONS: (This note is used to comply with regulatory documentation for home oxygen)  Patient Saturations on Room Air at Rest =100%  Patient Saturations on Room Air while Ambulating = 98%  Patient Saturations on 0 Liters of oxygen while Ambulating = 98%   

## 2015-12-20 NOTE — Discharge Summary (Signed)
Physician Discharge Summary   Patient ID: Andre Wilson MRN: [id number] DOB/AGE: [birth year] 80 y.o.  Admit date: 12/13/2015 Discharge date: 12/20/2015  Primary Care Physician:  No primary care provider on file.  Discharge Diagnoses:    . Acute combined systolic and diastolic heart failure (HCC) - unsure chronicity . Coronary artery disease involving native heart with angina pectoris (HCC)    Left Lower lobe infiltrate . Protein-calorie malnutrition, moderate (HCC) . GI bleed . Hypertension . Hypercholesteremia . Elevated troponin . Acute on chronic congestive heart failure (HCC) . Coronary artery disease involving autologous vein bypass graft . Mitral regurgitation and aortic stenosis . Cardiomyopathy, ischemic  Consults:  Cardiology  Recommendations for Outpatient Follow-up:  1. Home health PT, OT, RN arranged by case management 2. Please repeat CBC/BMET at next visit   DIET: Heart healthy diet    Allergies:  No Known Allergies   DISCHARGE MEDICATIONS: Current Discharge Medication List    START taking these medications   Details  amiodarone (PACERONE) 200 MG tablet Take 1 tablet (200 mg total) by mouth 2 (two) times daily. Qty: 60 tablet, Refills: 3    amoxicillin-clavulanate (AUGMENTIN) 875-125 MG tablet Take 1 tablet by mouth 2 (two) times daily. X 10 days Qty: 20 tablet, Refills: 0    oxyCODONE-acetaminophen (PERCOCET/ROXICET) 5-325 MG tablet Take 1 tablet by mouth every 6 (six) hours as needed for severe pain. Qty: 10 tablet, Refills: 0    promethazine (PHENERGAN) 12.5 MG tablet Take 1 tablet (12.5 mg total) by mouth every 6 (six) hours as needed for nausea or vomiting. Qty: 30 tablet, Refills: 0      CONTINUE these medications which have CHANGED   Details  aspirin EC 81 MG EC tablet Take 1 tablet (81 mg total) by mouth daily. Qty: 30 tablet, Refills: 3    furosemide (LASIX) 40 MG tablet Take 1 tablet (40 mg total) by mouth 2 (two) times daily. Qty:  60 tablet, Refills: 3      CONTINUE these medications which have NOT CHANGED   Details  atorvastatin (LIPITOR) 40 MG tablet Take 40 mg by mouth daily.    clopidogrel (PLAVIX) 75 MG tablet Take 75 mg by mouth daily.    ezetimibe (ZETIA) 10 MG tablet Take 10 mg by mouth daily.    ferrous sulfate 325 (65 FE) MG EC tablet Take 325 mg by mouth 3 (three) times daily with meals.    folic acid (FOLVITE) 1 MG tablet Take 1 mg by mouth daily.    latanoprost (XALATAN) 0.005 % ophthalmic solution Place 1 drop into both eyes at bedtime. Refills: 0    linaclotide (LINZESS) 145 MCG CAPS capsule Take 145 mcg by mouth daily as needed (for constipation).    nitroGLYCERIN (NITROSTAT) 0.4 MG SL tablet Place 0.4 mg under the tongue every 5 (five) minutes as needed for chest pain.    ranolazine (RANEXA) 500 MG 12 hr tablet Take 500 mg by mouth 2 (two) times daily.    timolol (TIMOPTIC) 0.5 % ophthalmic solution Place 1 drop into both eyes 2 (two) times daily. Refills: 0    vitamin C (ASCORBIC ACID) 500 MG tablet Take 500 mg by mouth 2 (two) times daily.      STOP taking these medications     amLODipine (NORVASC) 10 MG tablet      carvedilol (COREG) 3.125 MG tablet          Brief H and P: For complete details please refer to admission H and P,  but in brief Andre Wilson is a 80 y.o. male with medical history significant of hypertension, hyperlipidemia, GI bleeding, CAD, myocardial infarction, S/P of CABG 1987, GI bleeding, who presented with shortness of breath and bilateral ankle edema for past several days PTA, which had been progressively getting worse. He had mild dry cough, but no chest pain, fever or chills. Patient was admitted for CHF exacerbation. After being admitted to the hospital he was being treated for CHF, his echocardiogram was pending, he was close to his baseline on 12/15/2015 morning, however around 8:45 AM on 12/15/2015 patient went into a prolonged V. tach rhythm and became  briefly unresponsive, he never lost his pulse, he was given IV magnesium, placed on IV amiodarone, stat echogram and cardiology consult was obtained and he was transferred to stepdown/ICU status.   Hospital Course:   Acute on chronic Combined systolic and diastolic CHF - last EF more than one year ago was 55% due to ischemic cardiomyopathy EF now 25% -30% with diffuse hypokinesis, akinesis of inferior lateral myocardium likely due to new sustained monomorphic V. tach .  -Cardiology was consulted and followed closely, negative balance of 1.46 L with diuresis. - Cardiac cath 4/26 showed no PCI targets, essentially minimal collateral flow from LIMA-LAD with a vessel beds, see detailed report below.   VT arrest on 12/15/15  - On 12/15/2015, patient had prolonged run of sustained monomorphic V. tach and became briefly unresponsive, CPR < 1 min, had pulse immediately, no shocks. Cards and EP followed patient closely - s/p ICD placement 4/27, stable from EP standpoint - Continue aspirin, Plavix, amiodarone.  - Per cardiology transitioned to oral amiodarone. ACE inhibitor was discontinued due to low BP and renal function. Cannot tolerate beta blockers country to lower BP.   CAD with elevated troponins possibly due to demand ischemia from CHF and VT arrest - chest pain-free, continue aspirin, Coreg, statin, Zetia, Plavix and Ranexa for secondary prevention.   Left lower lobe infiltrates possibly due to aspiration during VT arrest - Placed on Augmentin for 10 days  Moderate to severe MR and AS - Follow cardiology recommendations  Dyslipidemia. - On statin and Zetia..  Moderate protein calorie malnutrition  - Nutrition consult, continue nutrition supplements   Essential hypertension.  - Discontinued Norvasc, currently BP on the lower side. Coreg, ACE inhibitor discontinued  H/O Diverticular bleed - stable.  Generalized debility - PT OT evaluation-> feeling very dizzy and weak, home  health PT OT arranged  Day of Discharge BP 102/52 mmHg  Pulse 78  Temp(Src) 97.7 F (36.5 C) (Oral)  Resp 18  Ht 5\' 9"  (1.753 m)  Wt 67.223 kg (148 lb 3.2 oz)  BMI 21.88 kg/m2  SpO2 100%  Physical Exam: General: Alert and awake oriented x3 not in any acute distress. HEENT: anicteric sclera, pupils reactive to light and accommodation CVS: S1-S2 clear no murmur rubs or gallops Chest: clear to auscultation bilaterally, no wheezing rales or rhonchi Abdomen: soft nontender, nondistended, normal bowel sounds Extremities: no cyanosis, clubbing or edema noted bilaterally Neuro: Cranial nerves II-XII intact, no focal neurological deficits   The results of significant diagnostics from this hospitalization (including imaging, microbiology, ancillary and laboratory) are listed below for reference.    LAB RESULTS: Basic Metabolic Panel:  Recent Labs Lab 12/19/15 0528 12/20/15 0608  NA 136 136  K 5.0 4.5  CL 102 102  CO2 25 25  GLUCOSE 106* 95  BUN 19 20  CREATININE 1.52* 1.42*  CALCIUM 8.9 8.7*  MG 2.0  --    Liver Function Tests:  Recent Labs Lab 12/15/15 1514 12/16/15 0511  AST 43* 25  ALT 60 41  ALKPHOS 52 50  BILITOT 0.7 0.8  PROT 6.5 5.9*  ALBUMIN 3.0* 2.7*   No results for input(s): LIPASE, AMYLASE in the last 168 hours. No results for input(s): AMMONIA in the last 168 hours. CBC:  Recent Labs Lab 12/13/15 1552  12/19/15 0528 12/20/15 0608  WBC 11.3*  < > 10.6* 9.7  NEUTROABS 8.8*  --   --   --   HGB 11.7*  < > 10.9* 10.1*  HCT 36.5*  < > 33.4* 31.7*  MCV 86.1  < > 85.4 86.6  PLT 279  < > 261 238  < > = values in this interval not displayed. Cardiac Enzymes:  Recent Labs Lab 12/15/15 1514 12/15/15 2112  TROPONINI 0.08* 0.08*   BNP: Invalid input(s): POCBNP CBG: No results for input(s): GLUCAP in the last 168 hours.  Significant Diagnostic Studies:  Dg Chest 2 View  12/13/2015  CLINICAL DATA:  Worsening shortness breath and hypoxia. Recent  pneumonia, GI bleeding, myocardial infarction. EXAM: CHEST  2 VIEW COMPARISON:  None. FINDINGS: The heart size and mediastinal contours are within normal limits. Pulmonary hyperinflation and coarsening of interstitial lung markings is consistent with COPD. No evidence of pulmonary consolidation or frank pulmonary edema. No evidence of pneumothorax or pleural effusion. Prior CABG noted. IMPRESSION: COPD.  No acute findings. Electronically Signed   By: Myles Rosenthal M.D.   On: 12/13/2015 16:12    2D ECHO: Study Conclusions  - Left ventricle: The cavity size was moderately dilated. Wall  thickness was normal. Systolic function was severely reduced. The  estimated ejection fraction was in the range of 25% to 30%.  Diffuse hypokinesis. There is akinesis of the inferolateral  myocardium. Doppler parameters are consistent with restrictive  physiology, indicative of decreased left ventricular diastolic  compliance and/or increased left atrial pressure. Doppler  parameters are consistent with high ventricular filling pressure. - Aortic valve: Valve mobility was restricted. There was severe  stenosis. There was mild regurgitation. Valve area (VTI): 0.79  cm^2. Valve area (Vmax): 0.75 cm^2. Valve area (Vmean): 0.74  cm^2. - Mitral valve: There was moderate to severe regurgitation. - Left atrium: The atrium was mildly dilated. - Right ventricle: Systolic function was moderately reduced. - Pulmonary arteries: Systolic pressure was mildly increased. PA  peak pressure: 33 mm Hg (S).  Right/Left Heart Cath and Coronary/Graft Angiography    Conclusion    1. Prox LAD to Mid LAD lesion, 100% stenosed. 2. Ost Cx to Prox Cx lesion, 100% stenosed. 3. Acute Mrg lesion, 70% stenosed. 4. Mid RCA lesion, 70% stenosed. 5. LIMA was injected is normal in caliber, and is anatomically normal. 6. The flow in the graft is reversed. 7. Dist LAD lesion, 75% stenosed. 8. SVG . 9. Origin lesion, 100%  stenosed. 10. SVG was injected . 11. Origin lesion, 100% stenosed. 12. SVG . 13. Origin to Prox Graft lesion, 100% stenosed.   Known occlusion of 3 saphenous vein grafts  Patent LIMA to LAD with a distal 70-75% anastomosis stenosis of the LAD. Lesion is calcified.  Total occlusion of the LAD, circumflex, and diffuse ratty disease in the RCA.  Severe left ventricular systolic dysfunction with estimated EF 25%.  Mildly elevated pulmonary artery pressures with normal LV filling pressure.     Disposition and Follow-up: Discharge Instructions    Diet - low sodium  heart healthy    Complete by:  As directed      Increase activity slowly    Complete by:  As directed             DISPOSITION: home    DISCHARGE FOLLOW-UP Follow-up Information    Follow up with Marca Ancona, MD On 12/26/2015.   Specialty:  Cardiology   Why:  2:30 PM [facility] (garrage code [id number])   Contact information:   [address] [place] [address] [phone]       Follow up with [facility] On 12/29/2015.   Specialty:  Cardiology   Why:  at 3:30PM for wound check    Contact information:   [address] [place] [address] [phone]      Schedule an appointment as soon as possible for a visit in 2 weeks to follow up.   Why:  for hospital follow-up   Contact information:   Please follow up with PCP, Dr Virl Cagey in [place], [place]        Time spent on Discharge:   Signed:   RAI,RIPUDEEP M.D. [facility] 12/20/2015, 12:24 PM Pager: [phone]

## 2015-12-20 NOTE — Plan of Care (Signed)
Problem: Phase I Progression Outcomes Goal: OOB with assistance Outcome: Completed/Met Date Met:  12/20/15 Patient was OOB to chair but had ambulated earlier in the shift and tolerated well, instructed to continue to slowy increase his activity but to be careful using his left arm till site healed better and he verbalized understanding.

## 2015-12-20 NOTE — Progress Notes (Signed)
Report received in patient's room using SBAR format, updated on patient's condition, new orders, VS, transferred to her care in stable condition.

## 2015-12-20 NOTE — Plan of Care (Signed)
Problem: Consults Goal: Skin Care Protocol Initiated - if Braden Score 18 or less If consults are not indicated, leave blank or document N/A Outcome: Completed/Met Date Met:  12/20/15 Braden scale is 19 and patient was instructed on care of his incision and the steri strips will come off on their own and not to pull them off, call for s/s of infection which were reviewed oozing of yellow/green foul smelling drainage, redness of the site to call his MD and patient verbalized understanding and he will get written D/C instructions prior to leaving the hospital.

## 2015-12-20 NOTE — Plan of Care (Signed)
Problem: Consults Goal: ICD Patient Education (See Patient Education module for education specifics.) Outcome: Completed/Met Date Met:  12/20/15 Patient received written and verbal instruction regarding his ICD placement and he was able to verbalize understanding

## 2015-12-20 NOTE — Care Management Note (Addendum)
Case Management Note  Patient Details  Name: Andre Wilson MRN: [id number] Date of Birth: 06-11-1936  Subjective/Objective:                  Acute combined systolic and diastolic heart failure   Action/Plan: CM spoke with patient at the bedside. He expects his daughter to arrive after 5 pm today. He agrees to call her to discussed choice of HH agency. She selected [facility] and states the physician told her HHA's would be ordered. [facility].   3:15 pm - CM [facility] patient [facility]. Informed he [facility] have [facility]. CM spoke with patient's daughter and explained the patient must see a PCP this week to continue with [facility]. [facility]   Expected Discharge Date:   12/20/15             Expected Discharge Plan:  Home w Home Health Services  In-House Referral:     Discharge planning Services  CM Consult  Post Acute Care Choice:    Choice offered to:  Adult Children, Patient  DME Arranged:  N/A DME Agency:  NA  HH Arranged:  PT, OT, RN, HHA HH Agency:  [facility]  Status of Service:  Completed, signed off  Medicare Important Message Given:  Yes Date Medicare IM Given:    Medicare IM give by:    Date Additional Medicare IM Given:    Additional Medicare Important Message give by:     If discussed at Long Length of Stay Meetings, dates discussed:    Additional Comments:  [name], [name], RN 12/20/2015, 2:34 PM

## 2015-12-20 NOTE — Plan of Care (Signed)
Problem: Phase I Progression Outcomes Goal: Hemodynamically stable Outcome: Completed/Met Date Met:  12/20/15 Patient remains NSR with occasional PVC's but has been asymptomatic, with B/P on the soft side but MAPS 65-80 and again remains asymptomatic

## 2015-12-20 NOTE — Plan of Care (Signed)
Problem: Phase II Progression Outcomes Goal: No evidence of ICD malfunction Outcome: Completed/Met Date Met:  12/20/15 Patient had his ICD interrogated already and had no problems, he is now ready for D/C.

## 2015-12-20 NOTE — Progress Notes (Signed)
Patient Name: Andre Wilson Date of Encounter: 12/20/2015  Principal Problem:   Acute combined systolic and diastolic heart failure (HCC) - unsure chronicity Active Problems:   GI bleed   Hypertension   Hypercholesteremia   Coronary artery disease involving native heart with angina pectoris (HCC)   Protein-calorie malnutrition, moderate (HCC)   Elevated troponin   Sustained VT (ventricular tachycardia) (HCC)   Cardiac arrest (HCC) secondary to sustained V tach requiring CPR 12/15/15   Acute on chronic congestive heart failure (HCC)   Coronary artery disease involving autologous vein bypass graft   Moderate to severe aortic stenosis   Mitral regurgitation and aortic stenosis   Cardiomyopathy, ischemic   Essential hypertension   Chest pain   Primary Cardiologist: Billie Lade and [place]  Patient Profile: 80 yo male w/ hx HTN, HLD, GIB, CABG 1987, PAD, admitted 04/22 w/ SOB, cards saw 04/23 for sustained VT req CPR, EF 25-30%, s/p EP eval, on amio and s/p St. Jude Ellipse VR (serial Number [id number]) ICD, f/u CHF clinic   SUBJECTIVE: Weak and light-headed when he got up this am, better now. Appetite is poor, drinking supplement  OBJECTIVE Filed Vitals:   12/19/15 2013 12/20/15 0500 12/20/15 0835 12/20/15 0843  BP: 106/56 94/52 102/52   Pulse: 86 82 78   Temp: 98.2 F (36.8 C) 98.1 F (36.7 C) 97.7 F (36.5 C)   TempSrc: Oral  Oral   Resp:  22 18   Height:      Weight:  148 lb 3.2 oz (67.223 kg)    SpO2: 95% 97% 100% 100%    Intake/Output Summary (Last 24 hours) at 12/20/15 0934 Last data filed at 12/20/15 1610  Gross per 24 hour  Intake    410 ml  Output   1125 ml  Net   -715 ml   Filed Weights   12/18/15 0500 12/19/15 0315 12/20/15 0500  Weight: 149 lb 0.5 oz (67.6 kg) 148 lb 1.6 oz (67.178 kg) 148 lb 3.2 oz (67.223 kg)    PHYSICAL EXAM General: Well developed, well nourished, male in no acute distress. Head: Normocephalic, atraumatic.  Neck: Supple  without bruits, JVD 9 cm. Lungs:  Resp regular and unlabored, few rales, good air exchange. ICD site with minimal edema Heart: RRR, S1, S2, no S3, S4, 2/6 murmur; no rub. Abdomen: Soft, non-tender, non-distended, BS + x 4.  Extremities: No clubbing, cyanosis, edema.  Neuro: Alert and oriented X 3. Moves all extremities spontaneously. Psych: Normal affect.  LABS: CBC: Recent Labs  12/19/15 0528 12/20/15 0608  WBC 10.6* 9.7  HGB 10.9* 10.1*  HCT 33.4* 31.7*  MCV 85.4 86.6  PLT 261 238   Basic Metabolic Panel: Recent Labs  12/18/15 0835 12/19/15 0528 12/20/15 0608  NA 138 136 136  K 4.7 5.0 4.5  CL 104 102 102  CO2 GLUCOSE 109* 106* 95  BUN CREATININE 1.32* 1.52* 1.42*  CALCIUM 9.0 8.9 8.7*  MG 1.9 2.0  --    Lab Results  Component Value Date   ALT 41 12/16/2015   AST 25 12/16/2015   ALKPHOS 50 12/16/2015   BILITOT 0.8 12/16/2015   BNP:  B NATRIURETIC PEPTIDE  Date/Time Value Ref Range Status  12/13/2015 03:53 PM 1431.1* 0.0 - 100.0 pg/mL Final   TELE:        Radiology/Studies: Dg Chest 2 View 12/19/2015  CLINICAL DATA:  Status post defibrillator placement EXAM: CHEST  2 VIEW  COMPARISON:  12/15/2015 FINDINGS: Cardiac shadow is within normal limits. The lungs are well aerated bilaterally. Some patchy left-sided infiltrate is seen projecting in the superior segment of the left lower lobe. Bilateral pleural effusions are noted. No pneumothorax is seen. IMPRESSION: New left lower lobe infiltrate. Defibrillator device without pneumothorax. Small bilateral pleural effusions. Electronically Signed   By: Alcide CleverMark  Lukens M.D.   On: 12/19/2015 07:48     Current Medications:  . amiodarone  200 mg Oral BID  . aspirin  81 mg Oral Daily  . atorvastatin  40 mg Oral Daily  . clopidogrel  75 mg Oral Daily  . ezetimibe  10 mg Oral Daily  . feeding supplement (ENSURE ENLIVE)  237 mL Oral BID PC  . ferrous sulfate  325 mg Oral TID WC  . folic acid  1 mg Oral  Daily  . furosemide  40 mg Oral BID  . heparin  5,000 Units Subcutaneous Q8H  . latanoprost  1 drop Both Eyes QHS  . lisinopril  2.5 mg Oral Daily  . multivitamin with minerals  1 tablet Oral Daily  . ranolazine  500 mg Oral BID  . sodium chloride flush  3 mL Intravenous Q12H  . timolol  1 drop Both Eyes BID  . vitamin C  500 mg Oral BID      ASSESSMENT AND PLAN: Principal Problem:   Acute combined systolic and diastolic heart failure (HCC) - unsure chronicity - still with JVD, but lung sounds are pretty good. - on oral Lasix - tolerates SBP pretty well.    Coronary artery disease involving native heart with angina pectoris (HCC) - s/p cath w/ 75% dz distal to LIMA-LAD anastamosis, med rx - on ASA, statin, Plavix, lisinopril, ranolazine, Zetia - no BB with low BP, Coreg 3.125 mg bid pta rx, restart, parameters for lisinopril  Otherwise, per IM, continue home rx. Active Problems:   GI bleed   Hypertension   Hypercholesteremia   Protein-calorie malnutrition, moderate (HCC)   Elevated troponin   Sustained VT (ventricular tachycardia) (HCC)   Cardiac arrest (HCC) secondary to sustained V tach requiring CPR 12/15/15   Acute on chronic congestive heart failure (HCC)   Coronary artery disease involving autologous vein bypass graft   Moderate to severe aortic stenosis   Mitral regurgitation and aortic stenosis   Cardiomyopathy, ischemic   Essential hypertension   Chest pain   Signed, Theodore DemarkBarrett, Rhonda , PA-C 9:34 AM 12/20/2015

## 2015-12-20 NOTE — Care Management Obs Status (Deleted)
MEDICARE OBSERVATION STATUS NOTIFICATION   Patient Details  Name: Milderd Meagerelson Flavell MRN: [id number] Date of Birth: 1936-05-26   Medicare Observation Status Notification Given:  Yes    [name], Makeba Delcastillo Harris, RN 12/20/2015, 10:05 AM

## 2015-12-20 NOTE — Plan of Care (Signed)
Problem: Consults Goal: Nutrition Consult-if indicated Outcome: Completed/Met Date Met:  12/20/15 Patient has orders for Ensure but prefers [place] in his room and reviewed Healthy eating to promote skin/wound healing and verbalized understanding.

## 2015-12-20 NOTE — Plan of Care (Signed)
Problem: Phase I Progression Outcomes Goal: Voiding-avoid urinary catheter unless indicated Outcome: Completed/Met Date Met:  12/20/15 Patient voiding in urinal adequate amounts without difficulty and urine is clear yellow with no s/s of infection noted

## 2015-12-25 ENCOUNTER — Telehealth (HOSPITAL_COMMUNITY): Payer: Self-pay | Admitting: *Deleted

## 2015-12-25 NOTE — Telephone Encounter (Signed)
Brayton CavesJessie, RN is admitting pt to [facility] and needs MD to be attended for orders, pt is sch to see Dr Shirlee LatchMcLean tomorrow, advised he will be attending

## 2015-12-26 ENCOUNTER — Ambulatory Visit (HOSPITAL_COMMUNITY)
Admit: 2015-12-26 | Discharge: 2015-12-26 | Disposition: A | Discharge status: Home / Self Care | Payer: Medicare Other | Source: Ambulatory Visit | Attending: Cardiology | Admitting: Cardiology

## 2015-12-26 ENCOUNTER — Encounter (HOSPITAL_COMMUNITY): Payer: Self-pay

## 2015-12-26 ENCOUNTER — Telehealth: Payer: Self-pay | Admitting: Family Medicine

## 2015-12-26 VITALS — BP 108/52 | HR 75 | Wt 143.4 lb

## 2015-12-26 DIAGNOSIS — I10 Essential (primary) hypertension: Secondary | ICD-10-CM | POA: Diagnosis not present

## 2015-12-26 DIAGNOSIS — R531 Weakness: Secondary | ICD-10-CM | POA: Diagnosis not present

## 2015-12-26 DIAGNOSIS — I080 Rheumatic disorders of both mitral and aortic valves: Secondary | ICD-10-CM | POA: Insufficient documentation

## 2015-12-26 DIAGNOSIS — D649 Anemia, unspecified: Secondary | ICD-10-CM | POA: Diagnosis not present

## 2015-12-26 DIAGNOSIS — I255 Ischemic cardiomyopathy: Secondary | ICD-10-CM | POA: Insufficient documentation

## 2015-12-26 DIAGNOSIS — I5022 Chronic systolic (congestive) heart failure: Secondary | ICD-10-CM | POA: Diagnosis not present

## 2015-12-26 DIAGNOSIS — Z7982 Long term (current) use of aspirin: Secondary | ICD-10-CM | POA: Insufficient documentation

## 2015-12-26 DIAGNOSIS — I25810 Atherosclerosis of coronary artery bypass graft(s) without angina pectoris: Secondary | ICD-10-CM | POA: Diagnosis not present

## 2015-12-26 DIAGNOSIS — Z7902 Long term (current) use of antithrombotics/antiplatelets: Secondary | ICD-10-CM | POA: Insufficient documentation

## 2015-12-26 DIAGNOSIS — I5023 Acute on chronic systolic (congestive) heart failure: Secondary | ICD-10-CM

## 2015-12-26 DIAGNOSIS — E785 Hyperlipidemia, unspecified: Secondary | ICD-10-CM | POA: Diagnosis not present

## 2015-12-26 DIAGNOSIS — I472 Ventricular tachycardia, unspecified: Secondary | ICD-10-CM

## 2015-12-26 DIAGNOSIS — I2510 Atherosclerotic heart disease of native coronary artery without angina pectoris: Secondary | ICD-10-CM | POA: Diagnosis not present

## 2015-12-26 DIAGNOSIS — Z95810 Presence of automatic (implantable) cardiac defibrillator: Secondary | ICD-10-CM | POA: Insufficient documentation

## 2015-12-26 DIAGNOSIS — I739 Peripheral vascular disease, unspecified: Secondary | ICD-10-CM | POA: Diagnosis not present

## 2015-12-26 DIAGNOSIS — Z8249 Family history of ischemic heart disease and other diseases of the circulatory system: Secondary | ICD-10-CM | POA: Diagnosis not present

## 2015-12-26 DIAGNOSIS — Z951 Presence of aortocoronary bypass graft: Secondary | ICD-10-CM | POA: Diagnosis not present

## 2015-12-26 LAB — COMPREHENSIVE METABOLIC PANEL
ALT: 28 U/L (ref 17–63)
AST: 23 U/L (ref 15–41)
Albumin: 3.5 g/dL (ref 3.5–5.0)
Alkaline Phosphatase: 53 U/L (ref 38–126)
Anion gap: 11 (ref 5–15)
BUN: 20 mg/dL (ref 6–20)
CHLORIDE: 100 mmol/L — AB (ref 101–111)
CO2: 25 mmol/L (ref 22–32)
CREATININE: 1.48 mg/dL — AB (ref 0.61–1.24)
Calcium: 9.6 mg/dL (ref 8.9–10.3)
GFR calc non Af Amer: 44 mL/min — ABNORMAL LOW (ref >60)
GFR, EST AFRICAN AMERICAN: 51 mL/min — AB (ref >60)
Glucose, Bld: 106 mg/dL — ABNORMAL HIGH (ref 65–99)
Potassium: 4.5 mmol/L (ref 3.5–5.1)
SODIUM: 136 mmol/L (ref 135–145)
Total Bilirubin: 0.6 mg/dL (ref 0.3–1.2)
Total Protein: 7.5 g/dL (ref 6.5–8.1)

## 2015-12-26 LAB — CBC
HEMATOCRIT: 38.8 % — AB (ref 39.0–52.0)
HEMOGLOBIN: 12.4 g/dL — AB (ref 13.0–17.0)
MCH: 27.6 pg (ref 26.0–34.0)
MCHC: 32 g/dL (ref 30.0–36.0)
MCV: 86.2 fL (ref 78.0–100.0)
Platelets: 425 10*3/uL — ABNORMAL HIGH (ref 150–400)
RBC: 4.5 MIL/uL (ref 4.22–5.81)
RDW: 14.6 % (ref 11.5–15.5)
WBC: 10.1 10*3/uL (ref 4.0–10.5)

## 2015-12-26 LAB — BRAIN NATRIURETIC PEPTIDE: B NATRIURETIC PEPTIDE 5: 644.5 pg/mL — AB (ref 0.0–100.0)

## 2015-12-26 LAB — TSH: TSH: 3.891 u[IU]/mL (ref 0.350–4.500)

## 2015-12-26 MED ORDER — TRAZODONE HCL 50 MG PO TABS: 25.0000 mg | ORAL_TABLET | Freq: Every evening | ORAL | Status: DC | PRN

## 2015-12-26 MED ORDER — AMIODARONE HCL 200 MG PO TABS: 200.0000 mg | ORAL_TABLET | Freq: Every day | ORAL | Status: DC

## 2015-12-26 MED ORDER — ATORVASTATIN CALCIUM 80 MG PO TABS: 80.0000 mg | ORAL_TABLET | Freq: Every day | ORAL | Status: DC

## 2015-12-26 MED ORDER — CARVEDILOL 3.125 MG PO TABS: 3.1250 mg | ORAL_TABLET | Freq: Two times a day (BID) | ORAL | Status: DC

## 2015-12-26 NOTE — Patient Instructions (Addendum)
RESTART Carvedilol 3.125 mg, one tab twice a day START Trazodone 25 mg, one half tab as needed for sleep DECREASE Amirodarone to 200 mg, one tab daily INCREASE Atorvastatin to 80 mg, one tab daily  You have been referred to [facility] [place] for primary care You have been referred to [facility] for further management of CAD  Your physician has requested that you have a lower or upper extremity arterial duplex. This test is an ultrasound of the arteries in the legs or arms. It looks at arterial blood flow in the legs and arms. Allow one hour for Lower and Upper Arterial scans. There are no restrictions or special instructions   Labs today  Your physician recommends that you schedule a follow-up appointment in: 2 weeks

## 2015-12-26 NOTE — Telephone Encounter (Signed)
Call from [place] [facility] to get pt set up with PCP. Called [phone] which is # given for pts daughter Toniann FailWendy. VM is full and unable to leave msg to est care.

## 2015-12-28 NOTE — Progress Notes (Signed)
Patient ID: Andre Wilson, male   DOB: [birth year], 80 y.o.   MRN: [id number] Cardiology: Dr. Shirlee LatchMcLean  80 yo with history of CAD s/p CABG, ischemic cardiomyopathy, and recent GI bleed presents for cardiology followup after hospitalization.  Patient has been followed in the past in [place].  He had CABG back in 1987.  [place] had an admission in [place] in 3/17 with GI bleeding.  Per patient and daughter, bleeding was severe but he did not have endoscopy.  He is back on ASA 81 and Plavix.    He was [facility] to [facility] 4/17 with acute on chronic systolic CHF.  After hospitalization, he had a VT arrest.  Echo showed EF 25-30% with moderately decreased RV function, probably moderate AS, and mod-severe MR.  LHC was done, showing 3/4 occluded bypass grafts.  LIMA-LAD was patent but had 70-75% anastomotic stenosis.  He was medically managed. RHC showed near-normal filling pressures.  St Jude ICD was placed.   He is now living with his daughter.  He is very weak.  He is short of breath after walking 25-30 feet.  No chest pain.  Chronic 2-pillow orthopnea.  Legs have been weak though he does not report claudication-type pain.  He has lost considerable weight since 3/17.  Poor appetite.  No lightheadedness, palpitations, syncope.   ECG: NSR, LVH, narrow QRS  Labs (4/17): K 4.5, creatinine 1.42, LDL 120, HCT 31.7  PMH: 1. CAD: S/p CABG 1987.  - LHC (4/17) with total occlusion of vein grafts x 3.  LIMA-LAD patent with 70-75% anastomotic stenosis.  Total occlusion of the LAD, LCx, and diffuse disease throughout the RCA.  He was managed medically.  2. Chronic systolic CHF: Ischemic cardiomyopathy.  St Jude ICD.  - Echo (4/17): EF 25-30%, inferolateral akinesis, moderately decreased RV systolic function, ?moderate aortic stenosis but mean gradient 10 mmHg, moderate to severe MR.  - RHC (4/17): mean RA 2, PA 42/14, mean PCWP 15, CI 2.09. 3. History of GI bleeding: 3/17.  Per report, bleeding was severe,  but he did not have endoscopy done.   4. PAD: 11/16 cath showed occluded bilateral SFAs.   5. VT: 4/17 admit with VT, loss of consciousness.  6. Hyperlipidemia 7. Aortic stenosis: Probably moderate on 4/17 echo.  8. Mitral regurgitation: Moderate to severe on 4/17 echo, suspect infarct-related.   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  Nonsmoker, no ETOH.   FH: CAD  ROS: All systems reviewed and negative except as per HPI.   Current Outpatient Prescriptions  Medication Sig Dispense Refill  . amiodarone (PACERONE) 200 MG tablet Take 1 tablet (200 mg total) by mouth daily. 30 tablet 3  . amoxicillin-clavulanate (AUGMENTIN) 875-125 MG tablet Take 1 tablet by mouth 2 (two) times daily. X 10 days 20 tablet 0  . aspirin EC 81 MG EC tablet Take 1 tablet (81 mg total) by mouth daily. 30 tablet 3  . atorvastatin (LIPITOR) 80 MG tablet Take 1 tablet (80 mg total) by mouth daily. 30 tablet 6  . clopidogrel (PLAVIX) 75 MG tablet Take 75 mg by mouth daily.    Marland Kitchen. ezetimibe (ZETIA) 10 MG tablet Take 10 mg by mouth daily.    . ferrous sulfate 325 (65 FE) MG EC tablet Take 325 mg by mouth 3 (three) times daily with meals.    . folic acid (FOLVITE) 1 MG tablet Take 1 mg by mouth daily.    . furosemide (LASIX) 40 MG tablet Take  1 tablet (40 mg total) by mouth 2 (two) times daily. 60 tablet 3  . latanoprost (XALATAN) 0.005 % ophthalmic solution Place 1 drop into both eyes at bedtime.  0  . linaclotide (LINZESS) 145 MCG CAPS capsule Take 145 mcg by mouth daily as needed (for constipation).    . nitroGLYCERIN (NITROSTAT) 0.4 MG SL tablet Place 0.4 mg under the tongue every 5 (five) minutes as needed for chest pain.    Marland Kitchen oxyCODONE-acetaminophen (PERCOCET/ROXICET) 5-325 MG tablet Take 1 tablet by mouth every 6 (six) hours as needed for severe pain. 10 tablet 0  . promethazine (PHENERGAN) 12.5 MG tablet Take 1 tablet (12.5 mg total) by mouth every 6 (six) hours as needed for  nausea or vomiting. 30 tablet 0  . ranolazine (RANEXA) 500 MG 12 hr tablet Take 500 mg by mouth 2 (two) times daily.    . timolol (TIMOPTIC) 0.5 % ophthalmic solution Place 1 drop into both eyes 2 (two) times daily.  0  . vitamin C (ASCORBIC ACID) 500 MG tablet Take 500 mg by mouth 2 (two) times daily.    . carvedilol (COREG) 3.125 MG tablet Take 1 tablet (3.125 mg total) by mouth 2 (two) times daily with a meal. 60 tablet 3  . traZODone (DESYREL) 50 MG tablet Take 0.5 tablets (25 mg total) by mouth at bedtime as needed for sleep. 15 tablet 3   No current facility-administered medications for this encounter.   BP 108/52 mmHg  Pulse 75  Wt 143 lb 6.4 oz (65.046 kg)  SpO2 100% General: NAD, thin Neck: No JVD, no thyromegaly or thyroid nodule.  Lungs: Clear to auscultation bilaterally with normal respiratory effort. CV: Nondisplaced PMI.  Heart regular S1/S2, no S3/S4, 2/6 SEM RUSB with clear S2.  No peripheral edema.  No carotid bruit.  Unable to palpate pedal pulses.  Abdomen: Soft, nontender, no hepatosplenomegaly, no distention.  Skin: Intact without lesions or rashes.  Neurologic: Alert and oriented x 3.  Psych: Normal affect. Extremities: No clubbing or cyanosis.  HEENT: Normal.   Assessment/Plan: 1. CAD: s/p CABG 1987.  4/17 cath showed all SVGs occluded.  The LIMA-LAD had a 70-75% anastomotic lesion.  There were no good options for intervention.   - Continue ASA 81 and Plavix.  - LDL 120 in 4/17, will increase atorvastatin to 80 mg daily with goal LDL < 70. Lipids/LFTs in 2 months.  - He is on Ranexa, likely for chest pain.  2. Chronic systolic CHF: Ischemic cardiomyopathy.  EF 20-25% on 4/17 echo.  On exam, he does not appear volume overloaded.  - Add Coreg 3.125 mg bid.  - Next step will be addition of lisinopril.  3. PAD: Leg weakness is a major complaint.  11/16 peripheral angiography per report showed bilateral SFA occlusions.   - Repeat peripheral arterial dopplers.  - PV  referral. 4. VT: Likely scar-mediated.  He is now on amiodarone.   - Decrease amiodarone to 200 mg daily.  Check LFTs and TSH today.  Will need regular eye exams with amiodarone use.  5. Anemia: GI bleeding of uncertain etiology in 3/17.   - CBC today.  6. Aortic stenosis: Appeared moderate on last echo.  7. Mitral regurgitation: Moderate to severe, likely infarct-related.   Marca Ancona 12/28/2015

## 2015-12-29 ENCOUNTER — Encounter: Payer: Self-pay | Admitting: Cardiology

## 2015-12-29 ENCOUNTER — Ambulatory Visit (INDEPENDENT_AMBULATORY_CARE_PROVIDER_SITE_OTHER): Payer: Medicare Other | Admitting: *Deleted

## 2015-12-29 DIAGNOSIS — I472 Ventricular tachycardia, unspecified: Secondary | ICD-10-CM

## 2015-12-30 LAB — CUP PACEART INCLINIC DEVICE CHECK
Brady Statistic RV Percent Paced: 0 %
HIGH POWER IMPEDANCE MEASURED VALUE: 68.625
Implantable Lead Implant Date: 20170427
Lead Channel Pacing Threshold Amplitude: 0.75 V
Lead Channel Pacing Threshold Pulse Width: 0.4 ms
Lead Channel Sensing Intrinsic Amplitude: 11.7 mV
Lead Channel Setting Pacing Amplitude: 3.5 V
Lead Channel Setting Pacing Pulse Width: 0.4 ms
MDC IDC LEAD LOCATION: 753860
MDC IDC MSMT BATTERY REMAINING LONGEVITY: 99.6
MDC IDC MSMT LEADCHNL RV IMPEDANCE VALUE: 537.5 Ohm
MDC IDC MSMT LEADCHNL RV PACING THRESHOLD AMPLITUDE: 0.75 V
MDC IDC MSMT LEADCHNL RV PACING THRESHOLD PULSEWIDTH: 0.4 ms
MDC IDC PG SERIAL: 7338650
MDC IDC SESS DTM: 20170508200621
MDC IDC SET LEADCHNL RV SENSING SENSITIVITY: 0.5 mV

## 2015-12-30 NOTE — Progress Notes (Signed)
Wound check appointment. Steri-strips removed. Wound without redness or edema. Incision edges approximated, wound well healed. Normal device function. Threshold, sensing, and impedances consistent with implant measurements. Device programmed at 3.5V for extra safety margin until 3 month visit. Histogram distribution appropriate for patient and level of activity. No ventricular arrhythmias noted. Patient educated about wound care, arm mobility, lifting restrictions, shock plan. Vibratory alert demonstrated for patient. ROV in 3 months with WC.

## 2015-12-30 NOTE — Progress Notes (Signed)
[facility].  Spoke with patient during device wound check appointment on 12/29/2015.  ICM introduction given and [facility].  Advised would send him a letter with his first remote ICM transmission date and to call if he has any questions.  Phone number provided.

## 2015-12-30 NOTE — Telephone Encounter (Signed)
Scheduled with Toniann FailWendy

## 2015-12-31 ENCOUNTER — Encounter: Payer: Self-pay | Admitting: Behavioral Health

## 2015-12-31 ENCOUNTER — Telehealth: Payer: Self-pay | Admitting: Behavioral Health

## 2015-12-31 NOTE — Telephone Encounter (Signed)
Pre-Visit Call completed with patient and chart updated.   Pre-Visit Info documented in Specialty Comments under [employer].    

## 2016-01-01 ENCOUNTER — Ambulatory Visit (INDEPENDENT_AMBULATORY_CARE_PROVIDER_SITE_OTHER): Payer: Medicare Other | Admitting: Family Medicine

## 2016-01-01 ENCOUNTER — Encounter: Payer: Self-pay | Admitting: Family Medicine

## 2016-01-01 VITALS — BP 100/60 | HR 74 | Temp 97.7°F | Ht 69.0 in | Wt 143.6 lb

## 2016-01-01 DIAGNOSIS — I5041 Acute combined systolic (congestive) and diastolic (congestive) heart failure: Secondary | ICD-10-CM | POA: Diagnosis not present

## 2016-01-01 DIAGNOSIS — R531 Weakness: Secondary | ICD-10-CM

## 2016-01-01 DIAGNOSIS — R031 Nonspecific low blood-pressure reading: Secondary | ICD-10-CM | POA: Diagnosis not present

## 2016-01-01 LAB — BASIC METABOLIC PANEL
BUN: 28 mg/dL — ABNORMAL HIGH (ref 6–23)
CALCIUM: 9.4 mg/dL (ref 8.4–10.5)
CHLORIDE: 100 meq/L (ref 96–112)
CO2: 27 mEq/L (ref 19–32)
CREATININE: 1.49 mg/dL (ref 0.40–1.50)
GFR: 48.54 mL/min — ABNORMAL LOW (ref >60.00)
Glucose, Bld: 109 mg/dL — ABNORMAL HIGH (ref 70–99)
Potassium: 4.3 mEq/L (ref 3.5–5.1)
SODIUM: 135 meq/L (ref 135–145)

## 2016-01-01 NOTE — Progress Notes (Signed)
Pre visit review using our clinic tool,if applicable. No additional management support is needed unless otherwise documented below in the visit note.  

## 2016-01-01 NOTE — Patient Instructions (Signed)
It was very nice to meet you today. Take care and let me know if I can be of any help to you I will check your metabolic profile today to check on your kidneys and potassium level. I will ask Dr. Shirlee LatchMcLean if you can reduce your lasix a bit depending on your results

## 2016-01-01 NOTE — Progress Notes (Addendum)
[facility] [address], [address] [place], [place] [address] [phone] [phone]  Date:  01/01/2016   Name:  Andre Wilson   DOB:  03-19-1936   MRN:  [id number]  PCP:  No primary care provider on file.    Chief Complaint: Establish Care   History of Present Illness:  Andre Wilson is a 80 y.o. very pleasant male patient who presents with the following:  Presented to the ED on 12/13/15 with SOB and swelling.  [place] to see his grandson play football at [employer]. He was admitted through 4/29 with acute systolic and diastolic HF, CAF, cardiomyopathy and pneumonia.  While admitted he became unresponsive with V tach and was resuscitated, now has an ICD.   Prior history of MI and CABG in 1987. After that he was doing well, living independently until he had a GI bleed March 2017- this led to another heart attack.  He was in a rehab center until 4/11, then traveled to see his daughter. unfortunately he got ill again with CHF as above  He had a cath during last admission that did not show any need for PCI  He is seeing Dr. Shirlee LatchMcLean for HF- visited there 6 days ago.  He is getting his strength back.  He always had high BP in the past, but now his BP is running low He is still taking lasix 40 BID, and just a touch of coreg.  He is also on amiodarone No falls, no passing out.  He does feel weak and a bit lightheaded a lot of the time still. Since March he has lost about 40 lbs and has lost a lot of his fitness and strnegth.   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   He has one daugther, and 3 grandchildren.   He is following a low sodium diet and doing daily weights  Lab Results  Component Value Date   HGBA1C 5.8* 12/14/2015     Wt Readings from Last 3 Encounters:  01/01/16 143 lb 9.6 oz (65.137 kg)  12/26/15 143 lb 6.4 oz (65.046 kg)  12/20/15 148 lb 3.2 oz (67.223 kg)     Patient  Active Problem List   Diagnosis Date Noted  . Chest pain   . Moderate to severe aortic stenosis 12/16/2015  . Mitral regurgitation and aortic stenosis 12/16/2015  . Cardiomyopathy, ischemic 12/16/2015  . Essential hypertension   . Sustained VT (ventricular tachycardia) (HCC) 12/15/2015  . Cardiac arrest [facility](HCC) secondary to sustained V tach requiring CPR 12/15/15 12/15/2015  . Acute on chronic congestive heart failure (HCC)   . Coronary artery disease involving autologous vein bypass graft   . Acute combined systolic and diastolic heart failure (HCC) - unsure chronicity 12/13/2015  . Coronary artery disease involving native heart with angina pectoris (HCC) 12/13/2015  . Protein-calorie malnutrition, moderate (HCC) 12/13/2015  . Elevated troponin 12/13/2015  . GI bleed   . Hypertension   . Hypercholesteremia     Past Medical History  Diagnosis Date  . MI (myocardial infarction) (HCC)   . GI bleed   . Hypertension   . Hypercholesteremia   . CAD (coronary artery disease)   . Sustained VT (ventricular tachycardia) (HCC) 12/15/2015  . Cardiac arrest [facility](HCC) secondary to sustained V tach requiring CPR 12/15/15 12/15/2015    s/p St. Jude Ellipse VR (serial Number [id number]) ICD  . Oxygen deficiency   .  Glaucoma   . Heart murmur   . Arthritis   . Blood transfusion without reported diagnosis   . Cataract   . CHF (congestive heart failure) [facility])     Past Surgical History  Procedure Laterality Date  . Cardiac catheterization      CABG 1987  . Cardiac catheterization N/A 12/17/2015    Procedure: Right/Left Heart Cath and Coronary/Graft Angiography;  Surgeon: Lyn Records, MD;  Location: [facility] INVASIVE CV LAB;  Service: Cardiovascular;  Laterality: N/A;  . Ep implantable device N/A 12/18/2015    Procedure: ICD Implant;  Surgeon: Will Jorja Loa, MD; [facility] VR (serial Number [id number]) ICD     Social History  Substance Use Topics  . Smoking status: Former Games developer  . Smokeless  tobacco: None  . Alcohol Use: No    Family History  Problem Relation Age of Onset  . Hypertension Mother   . Hyperlipidemia Mother   . Heart attack Father     No Known Allergies  Medication list has been reviewed and updated.  Current Outpatient Prescriptions on File Prior to Visit  Medication Sig Dispense Refill  . amiodarone (PACERONE) 200 MG tablet Take 1 tablet (200 mg total) by mouth daily. 30 tablet 3  . amoxicillin-clavulanate (AUGMENTIN) 875-125 MG tablet Take 1 tablet by mouth 2 (two) times daily. X 10 days 20 tablet 0  . aspirin EC 81 MG EC tablet Take 1 tablet (81 mg total) by mouth daily. 30 tablet 3  . atorvastatin (LIPITOR) 80 MG tablet Take 1 tablet (80 mg total) by mouth daily. 30 tablet 6  . carvedilol (COREG) 3.125 MG tablet Take 1 tablet (3.125 mg total) by mouth 2 (two) times daily with a meal. 60 tablet 3  . clopidogrel (PLAVIX) 75 MG tablet Take 75 mg by mouth daily.    Marland Kitchen ezetimibe (ZETIA) 10 MG tablet Take 10 mg by mouth daily.    . ferrous sulfate 325 (65 FE) MG EC tablet Take 325 mg by mouth 3 (three) times daily with meals.    . folic acid (FOLVITE) 1 MG tablet Take 1 mg by mouth daily.    . furosemide (LASIX) 40 MG tablet Take 1 tablet (40 mg total) by mouth 2 (two) times daily. 60 tablet 3  . latanoprost (XALATAN) 0.005 % ophthalmic solution Place 1 drop into both eyes at bedtime.  0  . linaclotide (LINZESS) 145 MCG CAPS capsule Take 145 mcg by mouth daily as needed (for constipation).    . nitroGLYCERIN (NITROSTAT) 0.4 MG SL tablet Place 0.4 mg under the tongue every 5 (five) minutes as needed for chest pain.    Marland Kitchen oxyCODONE-acetaminophen (PERCOCET/ROXICET) 5-325 MG tablet Take 1 tablet by mouth every 6 (six) hours as needed for severe pain. 10 tablet 0  . promethazine (PHENERGAN) 12.5 MG tablet Take 1 tablet (12.5 mg total) by mouth every 6 (six) hours as needed for nausea or vomiting. 30 tablet 0  . ranolazine (RANEXA) 500 MG 12 hr tablet Take 500 mg by  mouth 2 (two) times daily.    . timolol (TIMOPTIC) 0.5 % ophthalmic solution Place 1 drop into both eyes 2 (two) times daily.  0  . traZODone (DESYREL) 50 MG tablet Take 0.5 tablets (25 mg total) by mouth at bedtime as needed for sleep. 15 tablet 3  . vitamin C (ASCORBIC ACID) 500 MG tablet Take 500 mg by mouth 2 (two) times daily.     No current facility-administered medications on file prior  to visit.    Review of Systems:  As per HPI- otherwise negative.   Physical Examination: Filed Vitals:   01/01/16 1034  BP: 108/56  Pulse: 74  Temp: 97.7 F (36.5 C)   Filed Vitals:   01/01/16 1034  Height: 5\' 9"  (1.753 m)  Weight: 143 lb 9.6 oz (65.137 kg)   Body mass index is 21.2 kg/(m^2). Ideal Body Weight: Weight in (lb) to have BMI = 25: 168.9  GEN: WDWN, NAD, Non-toxic, A & O x 3, somewhat thin but well appearing.  Here with his daughter today HEENT: Atraumatic, Normocephalic. Neck supple. No masses, No LAD. Ears and Nose: No external deformity. CV: RRR, No M/G/R. No JVD. No thrill. No extra heart sounds. PULM: CTA B, no wheezes, crackles, rhonchi. No retractions. No resp. distress. No accessory muscle use. EXTR: No c/c/e NEURO moves slowly with rolling walker PSYCH: Normally interactive. Conversant. Not depressed or anxious appearing.  Calm demeanor.    Assessment and Plan: Acute combined systolic and diastolic heart failure (HCC) - unsure chronicity - Plan: Basic metabolic panel  Weakness  Low blood pressure reading  Here today to establish PCP care.  Recent long illness with a GI bleed/ MI and then readmitted with CHF.  Never had heart failure in the past  Recent labs per cardiology showed improved hg, normal K and NA, creat about 1.5.  Suspect he is feeing weak as his BP is much lower than he was used to, he has lost weight and is debilitated.   Will check a BMP today- his weight is very stable.  Will touch base with Dr. Shirlee Latch- ?can we decrease his lasix some Will  plan further follow- up pending labs.   Signed Abbe Amsterdam, MD  Received labs 5/12, called pts daughter Results for orders placed or performed in visit on 01/01/16  Basic metabolic panel  Result Value Ref Range   Sodium 135 135 - 145 mEq/L   Potassium 4.3 3.5 - 5.1 mEq/L   Chloride 100 96 - 112 mEq/L   CO2 27 19 - 32 mEq/L   Glucose, Bld 109 (H) 70 - 99 mg/dL   BUN 28 (H) 6 - 23 mg/dL   Creatinine, Ser 1.61 0.40 - 1.50 mg/dL   Calcium 9.4 8.4 - 09.6 mg/dL   GFR 04.54 (L) >09.81 mL/min   Will decrease lasix to 40mg  in the am and 20 in the pm.  LMOM with this info,  They will see CHF clinic in 10 days

## 2016-01-05 ENCOUNTER — Other Ambulatory Visit: Payer: Self-pay

## 2016-01-05 ENCOUNTER — Telehealth: Payer: Self-pay | Admitting: Family Medicine

## 2016-01-05 MED ORDER — EZETIMIBE 10 MG PO TABS
10.0000 mg | ORAL_TABLET | Freq: Every day | ORAL | Status: DC
Start: 2016-01-05 — End: 2016-07-27

## 2016-01-05 MED ORDER — RANOLAZINE ER 500 MG PO TB12: 500.0000 mg | ORAL_TABLET | Freq: Two times a day (BID) | ORAL | Status: DC

## 2016-01-05 NOTE — Telephone Encounter (Signed)
Caller name:Hooker,Wendy Relation to WU:JWJXBJYNpt:daughter  Call back number: [phone] [facility] [address] [place], [place] - [id number] [phone] (Fax)          Reason for call:  Daughter  requesting a refill ezetimibe (ZETIA) 10 MG tablet and ranolazine (RANEXA) 500 MG 12 hr tablet

## 2016-01-05 NOTE — Telephone Encounter (Signed)
Medications refill per request.

## 2016-01-06 ENCOUNTER — Ambulatory Visit (HOSPITAL_COMMUNITY)
Admission: RE | Admit: 2016-01-06 | Discharge: 2016-01-06 | Disposition: A | Discharge status: Home / Self Care | Payer: Medicare Other | Source: Ambulatory Visit | Attending: Cardiology | Admitting: Cardiology

## 2016-01-06 ENCOUNTER — Other Ambulatory Visit: Payer: Self-pay | Admitting: Cardiology

## 2016-01-06 ENCOUNTER — Other Ambulatory Visit (HOSPITAL_COMMUNITY): Payer: Self-pay | Admitting: Cardiology

## 2016-01-06 DIAGNOSIS — I714 Abdominal aortic aneurysm, without rupture: Secondary | ICD-10-CM | POA: Insufficient documentation

## 2016-01-06 DIAGNOSIS — I70202 Unspecified atherosclerosis of native arteries of extremities, left leg: Secondary | ICD-10-CM | POA: Insufficient documentation

## 2016-01-06 DIAGNOSIS — E785 Hyperlipidemia, unspecified: Secondary | ICD-10-CM | POA: Insufficient documentation

## 2016-01-06 DIAGNOSIS — I739 Peripheral vascular disease, unspecified: Secondary | ICD-10-CM

## 2016-01-06 DIAGNOSIS — I10 Essential (primary) hypertension: Secondary | ICD-10-CM | POA: Diagnosis not present

## 2016-01-06 DIAGNOSIS — I25810 Atherosclerosis of coronary artery bypass graft(s) without angina pectoris: Secondary | ICD-10-CM

## 2016-01-06 DIAGNOSIS — Z87891 Personal history of nicotine dependence: Secondary | ICD-10-CM | POA: Diagnosis not present

## 2016-01-06 DIAGNOSIS — I700 Atherosclerosis of aorta: Secondary | ICD-10-CM | POA: Diagnosis not present

## 2016-01-12 ENCOUNTER — Ambulatory Visit (HOSPITAL_COMMUNITY)
Admission: RE | Admit: 2016-01-12 | Discharge: 2016-01-12 | Disposition: A | Discharge status: Home / Self Care | Payer: Medicare Other | Source: Ambulatory Visit | Attending: Cardiology | Admitting: Cardiology

## 2016-01-12 ENCOUNTER — Encounter (HOSPITAL_COMMUNITY): Payer: Self-pay

## 2016-01-12 VITALS — BP 106/74 | HR 77 | Wt 144.5 lb

## 2016-01-12 DIAGNOSIS — I340 Nonrheumatic mitral (valve) insufficiency: Secondary | ICD-10-CM | POA: Insufficient documentation

## 2016-01-12 DIAGNOSIS — Z79899 Other long term (current) drug therapy: Secondary | ICD-10-CM | POA: Insufficient documentation

## 2016-01-12 DIAGNOSIS — I5022 Chronic systolic (congestive) heart failure: Secondary | ICD-10-CM | POA: Insufficient documentation

## 2016-01-12 DIAGNOSIS — I739 Peripheral vascular disease, unspecified: Secondary | ICD-10-CM

## 2016-01-12 DIAGNOSIS — I350 Nonrheumatic aortic (valve) stenosis: Secondary | ICD-10-CM | POA: Insufficient documentation

## 2016-01-12 DIAGNOSIS — I255 Ischemic cardiomyopathy: Secondary | ICD-10-CM | POA: Diagnosis not present

## 2016-01-12 DIAGNOSIS — E785 Hyperlipidemia, unspecified: Secondary | ICD-10-CM | POA: Insufficient documentation

## 2016-01-12 DIAGNOSIS — Z951 Presence of aortocoronary bypass graft: Secondary | ICD-10-CM | POA: Diagnosis present

## 2016-01-12 DIAGNOSIS — D649 Anemia, unspecified: Secondary | ICD-10-CM | POA: Insufficient documentation

## 2016-01-12 DIAGNOSIS — I25119 Atherosclerotic heart disease of native coronary artery with unspecified angina pectoris: Secondary | ICD-10-CM

## 2016-01-12 DIAGNOSIS — Z7982 Long term (current) use of aspirin: Secondary | ICD-10-CM | POA: Diagnosis not present

## 2016-01-12 DIAGNOSIS — I714 Abdominal aortic aneurysm, without rupture: Secondary | ICD-10-CM | POA: Insufficient documentation

## 2016-01-12 DIAGNOSIS — I2510 Atherosclerotic heart disease of native coronary artery without angina pectoris: Secondary | ICD-10-CM | POA: Diagnosis not present

## 2016-01-12 LAB — BRAIN NATRIURETIC PEPTIDE: B Natriuretic Peptide: 370.2 pg/mL — ABNORMAL HIGH (ref 0.0–100.0)

## 2016-01-12 LAB — BASIC METABOLIC PANEL
Anion gap: 6 (ref 5–15)
BUN: 21 mg/dL — AB (ref 6–20)
CALCIUM: 9.3 mg/dL (ref 8.9–10.3)
CO2: 30 mmol/L (ref 22–32)
CREATININE: 1.54 mg/dL — AB (ref 0.61–1.24)
Chloride: 100 mmol/L — ABNORMAL LOW (ref 101–111)
GFR calc Af Amer: 48 mL/min — ABNORMAL LOW (ref >60)
GFR, EST NON AFRICAN AMERICAN: 42 mL/min — AB (ref >60)
GLUCOSE: 114 mg/dL — AB (ref 65–99)
Potassium: 4.6 mmol/L (ref 3.5–5.1)
SODIUM: 136 mmol/L (ref 135–145)

## 2016-01-12 MED ORDER — LISINOPRIL 2.5 MG PO TABS: 2.5000 mg | ORAL_TABLET | Freq: Every evening | ORAL | Status: DC

## 2016-01-12 MED ORDER — SPIRONOLACTONE 25 MG PO TABS: 12.5000 mg | ORAL_TABLET | Freq: Every morning | ORAL | Status: DC

## 2016-01-12 NOTE — Patient Instructions (Signed)
Start Spironolactone 12.5 mg (1/2 tab) every morning  Start Lisinopril 2.5 mg (1 tab) every evening  Labs today  Labs in 2 weeks  Your physician recommends that you schedule a follow-up appointment in: 2 weeks

## 2016-01-13 ENCOUNTER — Telehealth: Payer: Self-pay | Admitting: Cardiology

## 2016-01-13 DIAGNOSIS — I5022 Chronic systolic (congestive) heart failure: Secondary | ICD-10-CM | POA: Insufficient documentation

## 2016-01-13 NOTE — Progress Notes (Signed)
Patient ID: Andre Wilson, male   DOB: 1936/07/14, 80 y.o.   MRN: [id number] PCP: Dr. Arthor Captain Cardiology: Dr. Shirlee Latch  80 yo with history of CAD s/p CABG, ischemic cardiomyopathy, and recent GI bleed presents for cardiology followup after hospitalization.  Patient has been followed in the past in [place].  He had CABG back in 1987.  He had an admission in [place] in 3/17 with GI bleeding.  Per patient and daughter, bleeding was severe but he did not have endoscopy.  He is back on ASA 81 and Plavix.    He was admitted to [facility] in 4/17 with acute on chronic systolic CHF.  After hospitalization, he had a VT arrest.  Echo showed EF 25-30% with moderately decreased RV function, probably moderate AS, and mod-severe MR.  LHC was done, showing 3/4 occluded bypass grafts.  LIMA-LAD was patent but had 70-75% anastomotic stenosis.  He was medically managed. RHC showed near-normal filling pressures.  St Jude ICD was placed.   He is now living with his daughter.  Legs have been weak though he does not report claudication-type pain.  His legs tire after walking about 1/2 block.  He is also short of breath after about 1/2 block walking.  No orthopnea/PND.  Weight is up 1 lb.  No lightheadedness, palpitations, syncope.   ECG: NSR, LVH, narrow QRS  Labs (4/17): K 4.5, creatinine 1.42, LDL 120, HCT 31.7 Labs (5/17): K 4.3, creatinine 1.49, BNP 645, TSH normal, HCT 38.8, LFTs normal  PMH: 1. CAD: S/p CABG 1987.  - LHC (4/17) with total occlusion of vein grafts x 3.  LIMA-LAD patent with 70-75% anastomotic stenosis.  Total occlusion of the LAD, LCx, and diffuse disease throughout the RCA.  He was managed medically.  2. Chronic systolic CHF: Ischemic cardiomyopathy.  St Jude ICD.  - Echo (4/17): EF 25-30%, inferolateral akinesis, moderately decreased RV systolic function, ?moderate aortic stenosis but mean gradient 10 mmHg, moderate to severe MR.  - RHC (4/17): mean RA 2, PA 42/14, mean PCWP 15, CI  2.09. 3. History of GI bleeding: 3/17.  Per report, bleeding was severe, but he did not have endoscopy done.   4. PAD: 11/16 cath showed occluded bilateral SFAs.   - Lower extremity dopplers (5/17) with bilateral SFA occlusions, > 50% left external iliac stenosis.  5. VT: 4/17 admit with VT, loss of consciousness.  6. Hyperlipidemia 7. Aortic stenosis: Probably moderate on 4/17 echo.  8. Mitral regurgitation: Moderate to severe on 4/17 echo, suspect infarct-related.  9. AAA: Abdominal US 5/17 with 4.5 cm AAA.   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  Nonsmoker (since college years), no ETOH.   FH: CAD  ROS: All systems reviewed and negative except as per HPI.   Current Outpatient Prescriptions  Medication Sig Dispense Refill  . amiodarone (PACERONE) 200 MG tablet Take 1 tablet (200 mg total) by mouth daily. 30 tablet 3  . aspirin EC 81 MG EC tablet Take 1 tablet (81 mg total) by mouth daily. 30 tablet 3  . atorvastatin (LIPITOR) 80 MG tablet Take 1 tablet (80 mg total) by mouth daily. 30 tablet 6  . carvedilol (COREG) 3.125 MG tablet Take 1 tablet (3.125 mg total) by mouth 2 (two) times daily with a meal. 60 tablet 3  . clopidogrel (PLAVIX) 75 MG tablet Take 75 mg by mouth daily.    Marland Kitchen ezetimibe (ZETIA) 10 MG tablet Take 1 tablet (10 mg total) by mouth daily.  30 tablet 1  . ferrous sulfate 325 (65 FE) MG EC tablet Take 325 mg by mouth 3 (three) times daily with meals.    . folic acid (FOLVITE) 1 MG tablet Take 1 mg by mouth daily.    . furosemide (LASIX) 40 MG tablet Take 1 tablet (40 mg total) by mouth 2 (two) times daily. 60 tablet 3  . latanoprost (XALATAN) 0.005 % ophthalmic solution Place 1 drop into both eyes at bedtime.  0  . linaclotide (LINZESS) 145 MCG CAPS capsule Take 145 mcg by mouth daily as needed (for constipation). Reported on 01/01/2016    . nitroGLYCERIN (NITROSTAT) 0.4 MG SL tablet Place 0.4 mg under the tongue every 5 (five) minutes  as needed for chest pain.    Marland Kitchen. oxyCODONE-acetaminophen (PERCOCET/ROXICET) 5-325 MG tablet Take 1 tablet by mouth every 6 (six) hours as needed for severe pain. 10 tablet 0  . promethazine (PHENERGAN) 12.5 MG tablet Take 1 tablet (12.5 mg total) by mouth every 6 (six) hours as needed for nausea or vomiting. 30 tablet 0  . ranolazine (RANEXA) 500 MG 12 hr tablet Take 1 tablet (500 mg total) by mouth 2 (two) times daily. 60 tablet 1  . timolol (TIMOPTIC) 0.5 % ophthalmic solution Place 1 drop into both eyes 2 (two) times daily.  0  . traZODone (DESYREL) 50 MG tablet Take 0.5 tablets (25 mg total) by mouth at bedtime as needed for sleep. 15 tablet 3  . vitamin C (ASCORBIC ACID) 500 MG tablet Take 500 mg by mouth 2 (two) times daily.    Marland Kitchen. lisinopril (PRINIVIL,ZESTRIL) 2.5 MG tablet Take 1 tablet (2.5 mg total) by mouth every evening. 30 tablet 3  . spironolactone (ALDACTONE) 25 MG tablet Take 0.5 tablets (12.5 mg total) by mouth every morning. 15 tablet 3   No current facility-administered medications for this encounter.   BP 106/74 mmHg  Pulse 77  Wt 144 lb 8 oz (65.545 kg)  SpO2 97% General: NAD, thin Neck: No JVD, no thyromegaly or thyroid nodule.  Lungs: Clear to auscultation bilaterally with normal respiratory effort. CV: Nondisplaced PMI.  Heart regular S1/S2, no S3/S4, 2/6 SEM RUSB with clear S2.  No peripheral edema.  No carotid bruit.  Unable to palpate pedal pulses.  Abdomen: Soft, nontender, no hepatosplenomegaly, no distention.  Skin: Intact without lesions or rashes.  Neurologic: Alert and oriented x 3.  Psych: Normal affect. Extremities: No clubbing or cyanosis.  HEENT: Normal.   Assessment/Plan: 1. CAD: s/p CABG 1987.  4/17 cath showed all SVGs occluded.  The LIMA-LAD had a 70-75% anastomotic lesion.  There were no good options for intervention.   - Continue ASA 81 and Plavix.  - Atorvastatin recently increased, check lipids/LFTs in 6/17.   - He is on Ranexa, likely for chest  pain.  2. Chronic systolic CHF: Ischemic cardiomyopathy.  EF 20-25% on 4/17 echo.  On exam, he does not appear volume overloaded.  - Continue Coreg 3.125 mg bid.  - Add lisinopril 2.5 mg qhs. - Add spironolactone 12.5 mg qam.  - Continue Lasix 40 mg bid. BMET/BNP today and again in 10 days.     3. PAD: Leg weakness is a major complaint.  11/16 peripheral angiography per report showed bilateral SFA occlusions.  Peripheral arterial dopplers in our system in 5/17 also showed bilateral SFA occlusions.   - PV referral. 4. VT: Likely scar-mediated.  He is now on amiodarone.   - Continue amiodarone 200 mg daily.  TSH  and LFTs normal recently.  Will need regular eye exams with amiodarone use.  5. Anemia: GI bleeding of uncertain etiology in 3/17.   6. Aortic stenosis: Appeared moderate on last echo.  7. Mitral regurgitation: Moderate to severe, likely infarct-related.  8. AAA: 4.5 cm in 5/17.  Repeat abdominal US in 11/17.   Followup in 2 wks.   Marca Ancona 01/13/2016

## 2016-01-13 NOTE — Telephone Encounter (Signed)
Pt's daughter called and wanted to know if it would be ok to increase tramadol to one tablet.  I do not know the pt will send to Dr. Shirlee LatchMcLean to get his opinion.  Told the daughter our office will call back tomorrow.

## 2016-01-13 NOTE — Telephone Encounter (Signed)
That would be fine 

## 2016-01-14 ENCOUNTER — Telehealth (HOSPITAL_COMMUNITY): Payer: Self-pay | Admitting: *Deleted

## 2016-01-14 ENCOUNTER — Telehealth: Payer: Self-pay | Admitting: *Deleted

## 2016-01-14 DIAGNOSIS — I5023 Acute on chronic systolic (congestive) heart failure: Secondary | ICD-10-CM

## 2016-01-14 DIAGNOSIS — I739 Peripheral vascular disease, unspecified: Secondary | ICD-10-CM

## 2016-01-14 MED ORDER — TRAZODONE HCL 50 MG PO TABS: 50.0000 mg | ORAL_TABLET | Freq: Every evening | ORAL | Status: DC | PRN

## 2016-01-14 NOTE — Addendum Note (Signed)
Encounter addended by: Noralee SpaceHeather M Terika Pillard, RN on: 01/14/2016 12:46 PM<BR>     Documentation filed: Visit Diagnoses

## 2016-01-14 NOTE — Telephone Encounter (Signed)
Per Dr Shirlee LatchMcLean:  [facility] Dr Kirke CorinArida or Dr Allyson SabalBerry.  Referral placed

## 2016-01-14 NOTE — Telephone Encounter (Signed)
Per Nada BoozerLaura Ingold, NP, called pt daughter, Toniann FailWendy, on DPR, and let her know that Dr. Toney RakesMcLean okd for pt to take a whole Trazodone for sleep. Pt daughter verbalized understanding.

## 2016-01-18 ENCOUNTER — Emergency Department (HOSPITAL_COMMUNITY): Payer: Medicare Other

## 2016-01-18 ENCOUNTER — Inpatient Hospital Stay (HOSPITAL_COMMUNITY)
Admission: EM | Admit: 2016-01-18 | Discharge: 2016-01-22 | DRG: 247 | Disposition: A | Discharge status: Home / Self Care | Payer: Medicare Other | Attending: Cardiovascular Disease | Admitting: Cardiovascular Disease

## 2016-01-18 ENCOUNTER — Ambulatory Visit (HOSPITAL_COMMUNITY): Admit: 2016-01-18 | Payer: Self-pay | Admitting: Cardiovascular Disease

## 2016-01-18 ENCOUNTER — Encounter (HOSPITAL_COMMUNITY)
Admission: EM | Disposition: A | Discharge status: Home / Self Care | Payer: Self-pay | Source: Home / Self Care | Attending: Cardiovascular Disease

## 2016-01-18 ENCOUNTER — Encounter (HOSPITAL_COMMUNITY): Payer: Self-pay | Admitting: Emergency Medicine

## 2016-01-18 DIAGNOSIS — I2102 ST elevation (STEMI) myocardial infarction involving left anterior descending coronary artery: Secondary | ICD-10-CM | POA: Diagnosis present

## 2016-01-18 DIAGNOSIS — I714 Abdominal aortic aneurysm, without rupture: Secondary | ICD-10-CM | POA: Diagnosis present

## 2016-01-18 DIAGNOSIS — I2584 Coronary atherosclerosis due to calcified coronary lesion: Secondary | ICD-10-CM | POA: Diagnosis present

## 2016-01-18 DIAGNOSIS — N179 Acute kidney failure, unspecified: Secondary | ICD-10-CM | POA: Diagnosis present

## 2016-01-18 DIAGNOSIS — I200 Unstable angina: Secondary | ICD-10-CM

## 2016-01-18 DIAGNOSIS — Z7902 Long term (current) use of antithrombotics/antiplatelets: Secondary | ICD-10-CM | POA: Diagnosis not present

## 2016-01-18 DIAGNOSIS — D649 Anemia, unspecified: Secondary | ICD-10-CM | POA: Diagnosis present

## 2016-01-18 DIAGNOSIS — Z8249 Family history of ischemic heart disease and other diseases of the circulatory system: Secondary | ICD-10-CM

## 2016-01-18 DIAGNOSIS — I080 Rheumatic disorders of both mitral and aortic valves: Secondary | ICD-10-CM | POA: Diagnosis present

## 2016-01-18 DIAGNOSIS — I213 ST elevation (STEMI) myocardial infarction of unspecified site: Secondary | ICD-10-CM

## 2016-01-18 DIAGNOSIS — I130 Hypertensive heart and chronic kidney disease with heart failure and stage 1 through stage 4 chronic kidney disease, or unspecified chronic kidney disease: Secondary | ICD-10-CM | POA: Diagnosis present

## 2016-01-18 DIAGNOSIS — Z87891 Personal history of nicotine dependence: Secondary | ICD-10-CM

## 2016-01-18 DIAGNOSIS — N189 Chronic kidney disease, unspecified: Secondary | ICD-10-CM | POA: Diagnosis present

## 2016-01-18 DIAGNOSIS — Z7982 Long term (current) use of aspirin: Secondary | ICD-10-CM | POA: Diagnosis not present

## 2016-01-18 DIAGNOSIS — Z95810 Presence of automatic (implantable) cardiac defibrillator: Secondary | ICD-10-CM

## 2016-01-18 DIAGNOSIS — I255 Ischemic cardiomyopathy: Secondary | ICD-10-CM | POA: Diagnosis present

## 2016-01-18 DIAGNOSIS — I771 Stricture of artery: Secondary | ICD-10-CM | POA: Diagnosis present

## 2016-01-18 DIAGNOSIS — I959 Hypotension, unspecified: Secondary | ICD-10-CM | POA: Diagnosis not present

## 2016-01-18 DIAGNOSIS — Z8674 Personal history of sudden cardiac arrest: Secondary | ICD-10-CM

## 2016-01-18 DIAGNOSIS — Z955 Presence of coronary angioplasty implant and graft: Secondary | ICD-10-CM

## 2016-01-18 DIAGNOSIS — I25810 Atherosclerosis of coronary artery bypass graft(s) without angina pectoris: Secondary | ICD-10-CM | POA: Diagnosis not present

## 2016-01-18 DIAGNOSIS — I5022 Chronic systolic (congestive) heart failure: Secondary | ICD-10-CM | POA: Diagnosis present

## 2016-01-18 DIAGNOSIS — R0789 Other chest pain: Secondary | ICD-10-CM | POA: Diagnosis present

## 2016-01-18 DIAGNOSIS — I10 Essential (primary) hypertension: Secondary | ICD-10-CM | POA: Diagnosis present

## 2016-01-18 DIAGNOSIS — I350 Nonrheumatic aortic (valve) stenosis: Secondary | ICD-10-CM

## 2016-01-18 DIAGNOSIS — I2510 Atherosclerotic heart disease of native coronary artery without angina pectoris: Secondary | ICD-10-CM | POA: Diagnosis present

## 2016-01-18 DIAGNOSIS — I472 Ventricular tachycardia, unspecified: Secondary | ICD-10-CM

## 2016-01-18 DIAGNOSIS — E7800 Pure hypercholesterolemia, unspecified: Secondary | ICD-10-CM | POA: Diagnosis present

## 2016-01-18 DIAGNOSIS — I2583 Coronary atherosclerosis due to lipid rich plaque: Secondary | ICD-10-CM | POA: Diagnosis present

## 2016-01-18 DIAGNOSIS — R079 Chest pain, unspecified: Secondary | ICD-10-CM | POA: Diagnosis not present

## 2016-01-18 DIAGNOSIS — H409 Unspecified glaucoma: Secondary | ICD-10-CM | POA: Diagnosis present

## 2016-01-18 DIAGNOSIS — I252 Old myocardial infarction: Secondary | ICD-10-CM | POA: Diagnosis not present

## 2016-01-18 HISTORY — PX: CARDIAC CATHETERIZATION: SHX172

## 2016-01-18 LAB — CBC
HCT: 37.3 % — ABNORMAL LOW (ref 39.0–52.0)
HEMOGLOBIN: 11.8 g/dL — AB (ref 13.0–17.0)
MCH: 28.1 pg (ref 26.0–34.0)
MCHC: 31.6 g/dL (ref 30.0–36.0)
MCV: 88.8 fL (ref 78.0–100.0)
Platelets: 257 10*3/uL (ref 150–400)
RBC: 4.2 MIL/uL — AB (ref 4.22–5.81)
RDW: 15.3 % (ref 11.5–15.5)
WBC: 8.7 10*3/uL (ref 4.0–10.5)

## 2016-01-18 LAB — DIFFERENTIAL
BASOS ABS: 0 10*3/uL (ref 0.0–0.1)
Basophils Relative: 0 %
Eosinophils Absolute: 0.1 10*3/uL (ref 0.0–0.7)
Eosinophils Relative: 1 %
LYMPHS ABS: 1.6 10*3/uL (ref 0.7–4.0)
LYMPHS PCT: 19 %
Monocytes Absolute: 0.6 10*3/uL (ref 0.1–1.0)
Monocytes Relative: 7 %
NEUTROS PCT: 73 %
Neutro Abs: 6.3 10*3/uL (ref 1.7–7.7)

## 2016-01-18 LAB — COMPREHENSIVE METABOLIC PANEL
ALBUMIN: 3.6 g/dL (ref 3.5–5.0)
ALT: 30 U/L (ref 17–63)
AST: 27 U/L (ref 15–41)
Alkaline Phosphatase: 51 U/L (ref 38–126)
Anion gap: 9 (ref 5–15)
BUN: 30 mg/dL — AB (ref 6–20)
CHLORIDE: 101 mmol/L (ref 101–111)
CO2: 25 mmol/L (ref 22–32)
Calcium: 9.6 mg/dL (ref 8.9–10.3)
Creatinine, Ser: 1.99 mg/dL — ABNORMAL HIGH (ref 0.61–1.24)
GFR calc Af Amer: 36 mL/min — ABNORMAL LOW (ref >60)
GFR calc non Af Amer: 31 mL/min — ABNORMAL LOW (ref >60)
GLUCOSE: 128 mg/dL — AB (ref 65–99)
POTASSIUM: 5.3 mmol/L — AB (ref 3.5–5.1)
SODIUM: 135 mmol/L (ref 135–145)
Total Bilirubin: 0.7 mg/dL (ref 0.3–1.2)
Total Protein: 7.5 g/dL (ref 6.5–8.1)

## 2016-01-18 LAB — TROPONIN I
TROPONIN I: 0.16 ng/mL — AB (ref <0.031)
TROPONIN I: 25.97 ng/mL — AB (ref <0.031)
TROPONIN I: 42.95 ng/mL — AB (ref <0.031)
Troponin I: 2.93 ng/mL (ref <0.031)

## 2016-01-18 LAB — I-STAT CHEM 8, ED
BUN: 30 mg/dL — AB (ref 6–20)
CALCIUM ION: 1.11 mmol/L — AB (ref 1.13–1.30)
Chloride: 100 mmol/L — ABNORMAL LOW (ref 101–111)
Creatinine, Ser: 2 mg/dL — ABNORMAL HIGH (ref 0.61–1.24)
Glucose, Bld: 119 mg/dL — ABNORMAL HIGH (ref 65–99)
HCT: 40 % (ref 39.0–52.0)
Hemoglobin: 13.6 g/dL (ref 13.0–17.0)
Potassium: 5.1 mmol/L (ref 3.5–5.1)
SODIUM: 137 mmol/L (ref 135–145)
TCO2: 24 mmol/L (ref 0–100)

## 2016-01-18 LAB — HEPARIN LEVEL (UNFRACTIONATED): Heparin Unfractionated: 0.16 IU/mL — ABNORMAL LOW (ref 0.30–0.70)

## 2016-01-18 LAB — PROTIME-INR
INR: 1.2 (ref 0.00–1.49)
Prothrombin Time: 15.4 seconds — ABNORMAL HIGH (ref 11.6–15.2)

## 2016-01-18 LAB — MRSA PCR SCREENING: MRSA BY PCR: NEGATIVE

## 2016-01-18 LAB — LIPID PANEL
CHOL/HDL RATIO: 4.1 ratio
Cholesterol: 135 mg/dL (ref 0–200)
HDL: 33 mg/dL — AB (ref >40)
LDL CALC: 90 mg/dL (ref 0–99)
Triglycerides: 58 mg/dL (ref <150)
VLDL: 12 mg/dL (ref 0–40)

## 2016-01-18 LAB — APTT: APTT: 28 s (ref 24–37)

## 2016-01-18 SURGERY — LEFT HEART CATH AND CORONARY ANGIOGRAPHY

## 2016-01-18 MED ORDER — ATORVASTATIN CALCIUM 80 MG PO TABS: 80.0000 mg | ORAL_TABLET | Freq: Every day | ORAL | Status: DC

## 2016-01-18 MED ORDER — ATORVASTATIN CALCIUM 80 MG PO TABS
80.0000 mg | ORAL_TABLET | Freq: Every day | ORAL | Status: DC
  Administered 2016-01-18 – 2016-01-22 (×5): 80 mg via ORAL
  Filled 2016-01-18 (×5): qty 1

## 2016-01-18 MED ORDER — ATROPINE SULFATE 1 MG/10ML IJ SOSY
PREFILLED_SYRINGE | INTRAMUSCULAR | Status: AC
  Filled 2016-01-18: qty 10

## 2016-01-18 MED ORDER — IOPAMIDOL (ISOVUE-370) INJECTION 76%
INTRAVENOUS | Status: DC | PRN
  Administered 2016-01-18: 30 mL via INTRA_ARTERIAL

## 2016-01-18 MED ORDER — MAGNESIUM HYDROXIDE 400 MG/5ML PO SUSP
30.0000 mL | Freq: Every day | ORAL | Status: DC | PRN
  Administered 2016-01-18: 30 mL via ORAL
  Filled 2016-01-18: qty 30

## 2016-01-18 MED ORDER — CLOPIDOGREL BISULFATE 75 MG PO TABS
75.0000 mg | ORAL_TABLET | Freq: Every day | ORAL | Status: DC
Start: 2016-01-18 — End: 2016-01-18

## 2016-01-18 MED ORDER — MORPHINE SULFATE (PF) 2 MG/ML IV SOLN: 2.0000 mg | INTRAVENOUS | Status: DC | PRN

## 2016-01-18 MED ORDER — HEPARIN SODIUM (PORCINE) 5000 UNIT/ML IJ SOLN
INTRAMUSCULAR | Status: AC
  Administered 2016-01-18: 5000 [IU]
  Filled 2016-01-18: qty 1

## 2016-01-18 MED ORDER — OXYCODONE-ACETAMINOPHEN 5-325 MG PO TABS: 1.0000 | ORAL_TABLET | Freq: Four times a day (QID) | ORAL | Status: DC | PRN

## 2016-01-18 MED ORDER — CLOPIDOGREL BISULFATE 75 MG PO TABS
75.0000 mg | ORAL_TABLET | Freq: Every day | ORAL | Status: DC
  Administered 2016-01-18 – 2016-01-22 (×5): 75 mg via ORAL
  Filled 2016-01-18 (×5): qty 1

## 2016-01-18 MED ORDER — CARVEDILOL 3.125 MG PO TABS
3.1250 mg | ORAL_TABLET | Freq: Two times a day (BID) | ORAL | Status: DC
  Administered 2016-01-18 – 2016-01-22 (×8): 3.125 mg via ORAL
  Filled 2016-01-18 (×9): qty 1

## 2016-01-18 MED ORDER — FERROUS SULFATE 325 (65 FE) MG PO TABS
325.0000 mg | ORAL_TABLET | Freq: Three times a day (TID) | ORAL | Status: DC
  Administered 2016-01-18 – 2016-01-22 (×8): 325 mg via ORAL
  Filled 2016-01-18 (×9): qty 1

## 2016-01-18 MED ORDER — AMIODARONE HCL 200 MG PO TABS
200.0000 mg | ORAL_TABLET | Freq: Every day | ORAL | Status: DC
  Administered 2016-01-18 – 2016-01-22 (×5): 200 mg via ORAL
  Filled 2016-01-18 (×5): qty 1

## 2016-01-18 MED ORDER — SODIUM CHLORIDE 0.9 % IV SOLN
INTRAVENOUS | Status: AC
  Administered 2016-01-18: 07:00:00 via INTRAVENOUS

## 2016-01-18 MED ORDER — EZETIMIBE 10 MG PO TABS
10.0000 mg | ORAL_TABLET | Freq: Every day | ORAL | Status: DC
  Administered 2016-01-18 – 2016-01-22 (×5): 10 mg via ORAL
  Filled 2016-01-18 (×5): qty 1

## 2016-01-18 MED ORDER — RANOLAZINE ER 500 MG PO TB12
500.0000 mg | ORAL_TABLET | Freq: Two times a day (BID) | ORAL | Status: DC
  Administered 2016-01-18 – 2016-01-22 (×9): 500 mg via ORAL
  Filled 2016-01-18 (×9): qty 1

## 2016-01-18 MED ORDER — TRAZODONE HCL 50 MG PO TABS
50.0000 mg | ORAL_TABLET | Freq: Every evening | ORAL | Status: DC | PRN
  Administered 2016-01-20: 50 mg via ORAL
  Filled 2016-01-18: qty 1

## 2016-01-18 MED ORDER — HEPARIN (PORCINE) IN NACL 100-0.45 UNIT/ML-% IJ SOLN
900.0000 [IU]/h | INTRAMUSCULAR | Status: DC
  Administered 2016-01-18: 650 [IU]/h via INTRAVENOUS
  Administered 2016-01-19: 850 [IU]/h via INTRAVENOUS
  Filled 2016-01-18 (×2): qty 250

## 2016-01-18 MED ORDER — TIMOLOL MALEATE 0.5 % OP SOLN
1.0000 [drp] | Freq: Two times a day (BID) | OPHTHALMIC | Status: DC
  Administered 2016-01-18 – 2016-01-21 (×7): 1 [drp] via OPHTHALMIC
  Filled 2016-01-18: qty 5

## 2016-01-18 MED ORDER — VITAMIN C 500 MG PO TABS
500.0000 mg | ORAL_TABLET | Freq: Two times a day (BID) | ORAL | Status: DC
  Administered 2016-01-18 – 2016-01-22 (×9): 500 mg via ORAL
  Filled 2016-01-18 (×9): qty 1

## 2016-01-18 MED ORDER — ASPIRIN EC 81 MG PO TBEC
81.0000 mg | DELAYED_RELEASE_TABLET | Freq: Every day | ORAL | Status: DC
  Administered 2016-01-18 – 2016-01-22 (×5): 81 mg via ORAL
  Filled 2016-01-18 (×5): qty 1

## 2016-01-18 MED ORDER — IOPAMIDOL (ISOVUE-370) INJECTION 76%
INTRAVENOUS | Status: AC
  Filled 2016-01-18: qty 125

## 2016-01-18 MED ORDER — FOLIC ACID 1 MG PO TABS
1.0000 mg | ORAL_TABLET | Freq: Every day | ORAL | Status: DC
  Administered 2016-01-18 – 2016-01-22 (×5): 1 mg via ORAL
  Filled 2016-01-18 (×5): qty 1

## 2016-01-18 MED ORDER — LIDOCAINE HCL (PF) 1 % IJ SOLN
INTRAMUSCULAR | Status: AC
  Filled 2016-01-18: qty 30

## 2016-01-18 MED ORDER — SODIUM CHLORIDE 0.9 % IV SOLN: 10.0000 mL/h | INTRAVENOUS | Status: DC

## 2016-01-18 MED ORDER — LINACLOTIDE 145 MCG PO CAPS
145.0000 ug | ORAL_CAPSULE | Freq: Every day | ORAL | Status: DC | PRN
  Filled 2016-01-18: qty 1

## 2016-01-18 MED ORDER — ENSURE ENLIVE PO LIQD
237.0000 mL | Freq: Two times a day (BID) | ORAL | Status: DC
  Administered 2016-01-18 – 2016-01-19 (×3): 237 mL via ORAL

## 2016-01-18 MED ORDER — NITROGLYCERIN 1 MG/10 ML FOR IR/CATH LAB
INTRA_ARTERIAL | Status: AC
  Filled 2016-01-18: qty 10

## 2016-01-18 MED ORDER — LIDOCAINE HCL (PF) 1 % IJ SOLN
INTRAMUSCULAR | Status: DC | PRN
  Administered 2016-01-18: 25 mL

## 2016-01-18 MED ORDER — ASPIRIN 81 MG PO CHEW
324.0000 mg | CHEWABLE_TABLET | Freq: Once | ORAL | Status: AC
  Administered 2016-01-18: 324 mg via ORAL
  Filled 2016-01-18: qty 4

## 2016-01-18 MED ORDER — PROMETHAZINE HCL 25 MG PO TABS: 12.5000 mg | ORAL_TABLET | Freq: Four times a day (QID) | ORAL | Status: DC | PRN

## 2016-01-18 MED ORDER — ONDANSETRON HCL 4 MG/2ML IJ SOLN: 4.0000 mg | Freq: Four times a day (QID) | INTRAMUSCULAR | Status: DC | PRN

## 2016-01-18 MED ORDER — FUROSEMIDE 40 MG PO TABS
40.0000 mg | ORAL_TABLET | Freq: Two times a day (BID) | ORAL | Status: DC
Start: 2016-01-18 — End: 2016-01-19
  Administered 2016-01-18 (×2): 40 mg via ORAL
  Filled 2016-01-18 (×3): qty 1

## 2016-01-18 MED ORDER — SPIRONOLACTONE 25 MG PO TABS
12.5000 mg | ORAL_TABLET | Freq: Every morning | ORAL | Status: DC
  Administered 2016-01-18 – 2016-01-22 (×5): 12.5 mg via ORAL
  Filled 2016-01-18 (×6): qty 1

## 2016-01-18 MED ORDER — NITROGLYCERIN 0.4 MG SL SUBL: 0.4000 mg | SUBLINGUAL_TABLET | SUBLINGUAL | Status: DC | PRN

## 2016-01-18 MED ORDER — LISINOPRIL 2.5 MG PO TABS
2.5000 mg | ORAL_TABLET | Freq: Every evening | ORAL | Status: DC
  Administered 2016-01-18: 2.5 mg via ORAL
  Filled 2016-01-18: qty 1

## 2016-01-18 MED ORDER — HEPARIN SODIUM (PORCINE) 5000 UNIT/ML IJ SOLN: 60.0000 [IU]/kg | INTRAMUSCULAR | Status: DC

## 2016-01-18 MED ORDER — SODIUM CHLORIDE 0.9% FLUSH
3.0000 mL | Freq: Two times a day (BID) | INTRAVENOUS | Status: DC
  Administered 2016-01-18 – 2016-01-21 (×7): 3 mL via INTRAVENOUS

## 2016-01-18 MED ORDER — LATANOPROST 0.005 % OP SOLN
1.0000 [drp] | Freq: Every day | OPHTHALMIC | Status: DC
  Administered 2016-01-18 – 2016-01-21 (×4): 1 [drp] via OPHTHALMIC
  Filled 2016-01-18: qty 2.5

## 2016-01-18 MED ORDER — SODIUM CHLORIDE 0.9% FLUSH: 3.0000 mL | INTRAVENOUS | Status: DC | PRN

## 2016-01-18 MED ORDER — HEPARIN (PORCINE) IN NACL 2-0.9 UNIT/ML-% IJ SOLN
INTRAMUSCULAR | Status: AC
  Filled 2016-01-18: qty 1000

## 2016-01-18 MED ORDER — SODIUM CHLORIDE 0.9 % IV SOLN: 250.0000 mL | INTRAVENOUS | Status: DC | PRN

## 2016-01-18 MED ORDER — ASPIRIN 81 MG PO CHEW: 81.0000 mg | CHEWABLE_TABLET | Freq: Every day | ORAL | Status: DC

## 2016-01-18 MED ORDER — ACETAMINOPHEN 325 MG PO TABS: 650.0000 mg | ORAL_TABLET | ORAL | Status: DC | PRN

## 2016-01-18 MED ORDER — HEPARIN SODIUM (PORCINE) 1000 UNIT/ML IJ SOLN
INTRAMUSCULAR | Status: DC | PRN
  Administered 2016-01-18: 2000 [IU] via INTRAVENOUS

## 2016-01-18 SURGICAL SUPPLY — 14 items
CATH INFINITI JR4 5F (CATHETERS) ×3 IMPLANT
CATH VISTA GUIDE 6FR IM 90 CM (CATHETERS) ×3 IMPLANT
DEVICE CONTINUOUS FLUSH (MISCELLANEOUS) ×3 IMPLANT
KIT ENCORE 26 ADVANTAGE (KITS) ×3 IMPLANT
KIT HEART LEFT (KITS) ×3 IMPLANT
PACK CARDIAC CATHETERIZATION (CUSTOM PROCEDURE TRAY) ×3 IMPLANT
PINNACLE LONG 6F 25CM (SHEATH) ×3
SHEATH INTRO PINNACLE 6F 25CM (SHEATH) ×1 IMPLANT
SHEATH PINNACLE 6F 10CM (SHEATH) ×3 IMPLANT
TRANSDUCER W/STOPCOCK (MISCELLANEOUS) ×3 IMPLANT
TUBING CIL FLEX 10 FLL-RA (TUBING) ×3 IMPLANT
WIRE EMERALD 3MM-J .035X150CM (WIRE) ×3 IMPLANT
WIRE EMERALD 3MM-J .035X260CM (WIRE) ×3 IMPLANT
WIRE HI TORQ VERSACORE-J 145CM (WIRE) ×3 IMPLANT

## 2016-01-18 NOTE — ED Notes (Signed)
Clothing removed and given to family.  No jewelry or dentures on person

## 2016-01-18 NOTE — ED Provider Notes (Signed)
CSN: [id number]     Arrival date & time 01/18/16  1478 History   First MD Initiated Contact with Patient 01/18/16 0448     Chief Complaint  Patient presents with  . Chest Pain  . Shortness of Breath     (Consider location/radiation/quality/duration/timing/severity/associated sxs/prior Treatment) HPI Comments: Patient presents to the emergency part for evaluation of chest pain. Patient reports that he was awakened from sleep by severe pain in the left side of his chest which radiates down the left shoulder and into the arm. He reports associated shortness of breath, nausea, diaphoresis.  Patient is a 80 y.o. male presenting with chest pain and shortness of breath.  Chest Pain Associated symptoms: diaphoresis and shortness of breath   Shortness of Breath Associated symptoms: chest pain and diaphoresis     Past Medical History  Diagnosis Date  . MI (myocardial infarction) (HCC)   . GI bleed   . Hypertension   . Hypercholesteremia   . CAD (coronary artery disease)   . Sustained VT (ventricular tachycardia) (HCC) 12/15/2015  . Cardiac arrest [facility]) secondary to sustained V tach requiring CPR 12/15/15 12/15/2015    s/p St. Jude Ellipse VR (serial Number [id number]) ICD  . Oxygen deficiency   . Glaucoma   . Heart murmur   . Arthritis   . Blood transfusion without reported diagnosis   . Cataract   . CHF (congestive heart failure) [facility])    Past Surgical History  Procedure Laterality Date  . Cardiac catheterization      CABG 1987  . Cardiac catheterization N/A 12/17/2015    Procedure: Right/Left Heart Cath and Coronary/Graft Angiography;  Surgeon: Lyn Records, MD;  Location: [facility] INVASIVE CV LAB;  Service: Cardiovascular;  Laterality: N/A;  . Ep implantable device N/A 12/18/2015    Procedure: ICD Implant;  Surgeon: Will Jorja Loa, MD; St. Jude [place] VR (serial Number [id number]) ICD    Family History  Problem Relation Age of Onset  . Hypertension Mother   . Hyperlipidemia Mother    . Heart attack Father    Social History  Substance Use Topics  . Smoking status: Former Games developer  . Smokeless tobacco: None  . Alcohol Use: No    Review of Systems  Constitutional: Positive for diaphoresis.  Respiratory: Positive for shortness of breath.   Cardiovascular: Positive for chest pain.  All other systems reviewed and are negative.     Allergies  Review of patient's allergies indicates no known allergies.  Home Medications   Prior to Admission medications   Medication Sig Start Date End Date Taking? Authorizing Provider  amiodarone (PACERONE) 200 MG tablet Take 1 tablet (200 mg total) by mouth daily. 12/26/15   Laurey Morale, MD  aspirin EC 81 MG EC tablet Take 1 tablet (81 mg total) by mouth daily. 12/20/15   Ripudeep Jenna Luo, MD  atorvastatin (LIPITOR) 80 MG tablet Take 1 tablet (80 mg total) by mouth daily. 12/26/15   Laurey Morale, MD  carvedilol (COREG) 3.125 MG tablet Take 1 tablet (3.125 mg total) by mouth 2 (two) times daily with a meal. 12/26/15   Laurey Morale, MD  clopidogrel (PLAVIX) 75 MG tablet Take 75 mg by mouth daily.    Historical Provider, MD  ezetimibe (ZETIA) 10 MG tablet Take 1 tablet (10 mg total) by mouth daily. 01/05/16   Gwenlyn Found Copland, MD  ferrous sulfate 325 (65 FE) MG EC tablet Take 325 mg by mouth 3 (three) times daily with meals.  Historical Provider, MD  folic acid (FOLVITE) 1 MG tablet Take 1 mg by mouth daily.    Historical Provider, MD  furosemide (LASIX) 40 MG tablet Take 1 tablet (40 mg total) by mouth 2 (two) times daily. 12/20/15   Ripudeep Jenna Luo, MD  latanoprost (XALATAN) 0.005 % ophthalmic solution Place 1 drop into both eyes at bedtime. 12/02/15   Historical Provider, MD  linaclotide (LINZESS) 145 MCG CAPS capsule Take 145 mcg by mouth daily as needed (for constipation). Reported on 01/01/2016    Historical Provider, MD  lisinopril (PRINIVIL,ZESTRIL) 2.5 MG tablet Take 1 tablet (2.5 mg total) by mouth every evening. 01/12/16    Laurey Morale, MD  nitroGLYCERIN (NITROSTAT) 0.4 MG SL tablet Place 0.4 mg under the tongue every 5 (five) minutes as needed for chest pain.    Historical Provider, MD  oxyCODONE-acetaminophen (PERCOCET/ROXICET) 5-325 MG tablet Take 1 tablet by mouth every 6 (six) hours as needed for severe pain. 12/20/15   Ripudeep Jenna Luo, MD  promethazine (PHENERGAN) 12.5 MG tablet Take 1 tablet (12.5 mg total) by mouth every 6 (six) hours as needed for nausea or vomiting. 12/20/15   Ripudeep Jenna Luo, MD  ranolazine (RANEXA) 500 MG 12 hr tablet Take 1 tablet (500 mg total) by mouth 2 (two) times daily. 01/05/16   Pearline Cables, MD  spironolactone (ALDACTONE) 25 MG tablet Take 0.5 tablets (12.5 mg total) by mouth every morning. 01/12/16   Laurey Morale, MD  timolol (TIMOPTIC) 0.5 % ophthalmic solution Place 1 drop into both eyes 2 (two) times daily. 12/02/15   Historical Provider, MD  traZODone (DESYREL) 50 MG tablet Take 1 tablet (50 mg total) by mouth at bedtime as needed for sleep. 01/14/16   Leone Brand, NP  vitamin C (ASCORBIC ACID) 500 MG tablet Take 500 mg by mouth 2 (two) times daily.    Historical Provider, MD   BP 107/66 mmHg  Pulse 85  Temp(Src) 97.5 F (36.4 C) (Oral)  Resp 0  SpO2 94% Physical Exam  Constitutional: He is oriented to person, place, and time. He appears well-developed and well-nourished. No distress.  HENT:  Head: Normocephalic and atraumatic.  Right Ear: Hearing normal.  Left Ear: Hearing normal.  Nose: Nose normal.  Mouth/Throat: Oropharynx is clear and moist and mucous membranes are normal.  Eyes: Conjunctivae and EOM are normal. Pupils are equal, round, and reactive to light.  Neck: Normal range of motion. Neck supple.  Cardiovascular: Regular rhythm, S1 normal and S2 normal.  Exam reveals no gallop and no friction rub.   No murmur heard. Pulmonary/Chest: Effort normal and breath sounds normal. No respiratory distress. He exhibits no tenderness.  Abdominal: Soft. Normal  appearance and bowel sounds are normal. There is no hepatosplenomegaly. There is no tenderness. There is no rebound, no guarding, no tenderness at McBurney's point and negative Murphy's sign. No hernia.  Musculoskeletal: Normal range of motion.  Neurological: He is alert and oriented to person, place, and time. He has normal strength. No cranial nerve deficit or sensory deficit. Coordination normal. GCS eye subscore is 4. GCS verbal subscore is 5. GCS motor subscore is 6.  Skin: Skin is warm, dry and intact. No rash noted. No cyanosis.  Psychiatric: He has a normal mood and affect. His speech is normal and behavior is normal. Thought content normal.  Nursing note and vitals reviewed.   ED Course  Procedures (including critical care time) Labs Review Labs Reviewed  CBC - Abnormal; Notable  for the following:    RBC 4.20 (*)    Hemoglobin 11.8 (*)    HCT 37.3 (*)    All other components within normal limits  PROTIME-INR - Abnormal; Notable for the following:    Prothrombin Time 15.4 (*)    All other components within normal limits  COMPREHENSIVE METABOLIC PANEL - Abnormal; Notable for the following:    Potassium 5.3 (*)    Glucose, Bld 128 (*)    BUN 30 (*)    Creatinine, Ser 1.99 (*)    GFR calc non Af Amer 31 (*)    GFR calc Af Amer 36 (*)    All other components within normal limits  TROPONIN I - Abnormal; Notable for the following:    Troponin I 0.16 (*)    All other components within normal limits  LIPID PANEL - Abnormal; Notable for the following:    HDL 33 (*)    All other components within normal limits  I-STAT CHEM 8, ED - Abnormal; Notable for the following:    Chloride 100 (*)    BUN 30 (*)    Creatinine, Ser 2.00 (*)    Glucose, Bld 119 (*)    Calcium, Ion 1.11 (*)    All other components within normal limits  MRSA PCR SCREENING  DIFFERENTIAL  APTT  TROPONIN I  TROPONIN I  TROPONIN I    Imaging Review Dg Chest Port 1 View  01/18/2016  CLINICAL DATA:  Chest  pain today. EXAM: PORTABLE CHEST 1 VIEW COMPARISON:  Frontal and lateral views 12/19/2015 FINDINGS: Single lead pacemaker remains in place. Patient is post median sternotomy. The patient is rotated. Heart at the upper limits normal in size. The left lower lobe consolidation on prior exam is no longer seen. There is no pulmonary edema. Underlying chronic interstitial markings again seen. No pneumothorax. Previous pleural effusions are not seen, left costophrenic angle excluded. IMPRESSION: Resolution of previous left lung consolidation and pleural effusions. No new or acute abnormalities are seen. Electronically Signed   By: Rubye OaksMelanie  Ehinger M.D.   On: 01/18/2016 05:57   I have personally reviewed and evaluated these images and lab results as part of my medical decision-making.   EKG Interpretation   Date/Time:  Sunday Jan 18 2016 04:47:50 EDT Ventricular Rate:  84 PR Interval:  186 QRS Duration: 118 QT Interval:  426 QTC Calculation: 503 R Axis:   72 Text Interpretation:  STEMI Normal sinus rhythm Non-specific  intra-ventricular conduction delay ST elevation consider anterior injury  or acute infarct Prolonged QT Abnormal ECG Confirmed by Rhunette CroftNANAVATI, MD,  Janey GentaANKIT ([id number]) on 01/18/2016 4:49:19 AM      MDM   Final diagnoses:  ST elevation myocardial infarction (STEMI), unspecified artery [facility](HCC)    Patient presents to the emergency department for evaluation of chest pain. Patient has a history of known coronary artery disease. Family report a complicated recent past medical history. Patient was admitted to the ICU in [place] recently with large volume GI bleed requiring 11 units of blood. During this time he had renal failure and an MI. Patient was then admitted to the hospital last month with acute congestive heart failure. During that hospital stay he had a V. tach arrest.  EKG performed at arrival showed approximately 2 mm of elevation in V2 and large elevation in V3 concerning for acute  ST elevation MI. Code STEMI was initiated. Patient evaluated by cardiology and was taken to the cardiac catheterization lab for intervention.  CRITICAL CARE  Performed by: Gilda Crease   Total critical care time: 30 minutes  Critical care time was exclusive of separately billable procedures and treating other patients.  Critical care was necessary to treat or prevent imminent or life-threatening deterioration.  Critical care was time spent personally by me on the following activities: development of treatment plan with patient and/or surrogate as well as nursing, discussions with consultants, evaluation of patient's response to treatment, examination of patient, obtaining history from patient or surrogate, ordering and performing treatments and interventions, ordering and review of laboratory studies, ordering and review of radiographic studies, pulse oximetry and re-evaluation of patient's condition.       Gilda Crease, MD 01/18/16 [phone]

## 2016-01-18 NOTE — H&P (Signed)
Primary Cardiologist: Alyson LocketMcLean  COPLAND,JESSICA, MD  Chief Complaint: Acute Chest Pain, CODE STEMI  HPI:  80 yo with history of CAD s/p CABG (all SVGs down), ischemic cardiomyopathy, and GI bleeding history who presents after waking up with acute chest pain and diaphoresis. He had CABG back in 1987. He also had an admission in [place] in 3/17 with GI bleeding.Records show this GI bleed was significant, requiring blood transfusions. He since has been back on ASA 81 and Plavix. He was [facility] to [facility] in 4/17 with acute on chronic systolic CHF. After hospitalization, he had a VT arrest. Echo showed EF 25-30% with moderately decreased RV function, moderate AS, and mod-severe MR. LHC was done, showing 3/4 occluded bypass grafts. LIMA-LAD was patent but had  anastomotic disease. Vascular access was very challenging requiring 38F catheters.  He was medically managed with aspirin/plavix. RHC showed near-normal filling pressures. St Jude ICD was placed.   He awoke acutely tonight with chest pain from sleep.  He presents with his wife and family, and they note he was very sweaty with his chest pain.  They called EMS and he presents with some residual chest pain, but better than earlier in the evening.  He got full strength ASA in the field.  Upon arrival, ECG showed NSR with non-specific IVCD and QTC 503 with ST elevations V3, V4 and other non specific ST-T wave changes. No recent issues with SOB, swelling, N/V, diarrhea.  He did note diaphoresis with his chest pain. 5,000 U of heparin administered in the ED.  He was recently started on lisinopril and spironolactone and a iSTAT chemistry panel was sent to ensure changes were not the result of hyperkalemia.  Chemistry returned with Crt 2, K 5.1.  Decision was then made given pain and ECG changes to take emergently to the cath lab.  He was quoted a high procedural risk given his severe native disease.  Recent Cardiac Studies - Echo (4/17):  EF 25-30%, inferolateral akinesis, moderately decreased RV systolic function, ?moderate aortic stenosis but mean gradient 10 mmHg, moderate to severe MR.  - RHC (4/17): mean RA 2, PA 42/14, mean PCWP 15, CI 2.09. - LHC (4/17):   Known occlusion of 3 saphenous vein grafts  Patent LIMA to LAD with a distal 70-75% anastomosis stenosis of the LAD. Lesion is calcified.  Total occlusion of the LAD, circumflex, and diffuse ratty disease in the RCA.  Severe left ventricular systolic dysfunction with estimated EF 25%.  Mildly elevated pulmonary artery pressures with normal LV filling pressure.  Past Medical History  Diagnosis Date  . MI (myocardial infarction) (HCC)   . GI bleed   . Hypertension   . Hypercholesteremia   . CAD (coronary artery disease)   . Sustained VT (ventricular tachycardia) (HCC) 12/15/2015  . Cardiac arrest [facility](HCC) secondary to sustained V tach requiring CPR 12/15/15 12/15/2015    s/p St. Jude Ellipse VR (serial Number [id number]) ICD  . Oxygen deficiency   . Glaucoma   . Heart murmur   . Arthritis   . Blood transfusion without reported diagnosis   . Cataract   . CHF (congestive heart failure) [facility](HCC)     Past Surgical History  Procedure Laterality Date  . Cardiac catheterization      CABG 1987  . Cardiac catheterization N/A 12/17/2015    Procedure: Right/Left Heart Cath and Coronary/Graft Angiography;  Surgeon: Lyn RecordsHenry W Smith, MD;  [facility];  Service: Cardiovascular;  Laterality: N/A;  .  Ep implantable device N/A 12/18/2015    Procedure: ICD Implant;  Surgeon: Will Jorja Loa, MD; St. Jude [place] VR (serial Number [id number]) ICD     Family History  Problem Relation Age of Onset  . Hypertension Mother   . Hyperlipidemia Mother   . Heart attack Father    Social History:  reports that he has quit smoking. He does not have any smokeless tobacco history on file. He reports that he does not drink alcohol or use illicit drugs.  Allergies: No Known  Allergies  No current facility-administered medications on file prior to encounter.   Current Outpatient Prescriptions on File Prior to Encounter  Medication Sig Dispense Refill  . amiodarone (PACERONE) 200 MG tablet Take 1 tablet (200 mg total) by mouth daily. 30 tablet 3  . aspirin EC 81 MG EC tablet Take 1 tablet (81 mg total) by mouth daily. 30 tablet 3  . atorvastatin (LIPITOR) 80 MG tablet Take 1 tablet (80 mg total) by mouth daily. 30 tablet 6  . carvedilol (COREG) 3.125 MG tablet Take 1 tablet (3.125 mg total) by mouth 2 (two) times daily with a meal. 60 tablet 3  . clopidogrel (PLAVIX) 75 MG tablet Take 75 mg by mouth daily.    Marland Kitchen ezetimibe (ZETIA) 10 MG tablet Take 1 tablet (10 mg total) by mouth daily. 30 tablet 1  . ferrous sulfate 325 (65 FE) MG EC tablet Take 325 mg by mouth 3 (three) times daily with meals.    . folic acid (FOLVITE) 1 MG tablet Take 1 mg by mouth daily.    . furosemide (LASIX) 40 MG tablet Take 1 tablet (40 mg total) by mouth 2 (two) times daily. 60 tablet 3  . latanoprost (XALATAN) 0.005 % ophthalmic solution Place 1 drop into both eyes at bedtime.  0  . linaclotide (LINZESS) 145 MCG CAPS capsule Take 145 mcg by mouth daily as needed (for constipation). Reported on 01/01/2016    . lisinopril (PRINIVIL,ZESTRIL) 2.5 MG tablet Take 1 tablet (2.5 mg total) by mouth every evening. 30 tablet 3  . nitroGLYCERIN (NITROSTAT) 0.4 MG SL tablet Place 0.4 mg under the tongue every 5 (five) minutes as needed for chest pain.    Marland Kitchen oxyCODONE-acetaminophen (PERCOCET/ROXICET) 5-325 MG tablet Take 1 tablet by mouth every 6 (six) hours as needed for severe pain. 10 tablet 0  . promethazine (PHENERGAN) 12.5 MG tablet Take 1 tablet (12.5 mg total) by mouth every 6 (six) hours as needed for nausea or vomiting. 30 tablet 0  . ranolazine (RANEXA) 500 MG 12 hr tablet Take 1 tablet (500 mg total) by mouth 2 (two) times daily. 60 tablet 1  . spironolactone (ALDACTONE) 25 MG tablet Take 0.5  tablets (12.5 mg total) by mouth every morning. 15 tablet 3  . timolol (TIMOPTIC) 0.5 % ophthalmic solution Place 1 drop into both eyes 2 (two) times daily.  0  . traZODone (DESYREL) 50 MG tablet Take 1 tablet (50 mg total) by mouth at bedtime as needed for sleep. 90 tablet 3  . vitamin C (ASCORBIC ACID) 500 MG tablet Take 500 mg by mouth 2 (two) times daily.       Results for orders placed or performed during the hospital encounter of 01/18/16 (from the past 48 hour(s))  CBC     Status: Abnormal   Collection Time: 01/18/16  4:56 AM  Result Value Ref Range   WBC 8.7 4.0 - 10.5 K/uL   RBC 4.20 (L) 4.22 - 5.81  MIL/uL   Hemoglobin 11.8 (L) 13.0 - 17.0 g/dL   HCT 40.9 (L) 81.1 - 91.4 %   MCV 88.8 78.0 - 100.0 fL   MCH 28.1 26.0 - 34.0 pg   MCHC 31.6 30.0 - 36.0 g/dL   RDW 78.2 95.6 - 21.3 %   Platelets 257 150 - 400 K/uL  Differential     Status: None   Collection Time: 01/18/16  4:56 AM  Result Value Ref Range   Neutrophils Relative % 73 %   Neutro Abs 6.3 1.7 - 7.7 K/uL   Lymphocytes Relative 19 %   Lymphs Abs 1.6 0.7 - 4.0 K/uL   Monocytes Relative 7 %   Monocytes Absolute 0.6 0.1 - 1.0 K/uL   Eosinophils Relative 1 %   Eosinophils Absolute 0.1 0.0 - 0.7 K/uL   Basophils Relative 0 %   Basophils Absolute 0.0 0.0 - 0.1 K/uL  I-Stat Chem 8, ED     Status: Abnormal   Collection Time: 01/18/16  5:12 AM  Result Value Ref Range   Sodium 137 135 - 145 mmol/L   Potassium 5.1 3.5 - 5.1 mmol/L   Chloride 100 (L) 101 - 111 mmol/L   BUN 30 (H) 6 - 20 mg/dL   Creatinine, Ser 0.86 (H) 0.61 - 1.24 mg/dL   Glucose, Bld 578 (H) 65 - 99 mg/dL   Calcium, Ion 4.69 (L) 1.13 - 1.30 mmol/L   TCO2 24 0 - 100 mmol/L   Hemoglobin 13.6 13.0 - 17.0 g/dL   HCT 62.9 52.8 - 41.3 %   No results found.  ROS: Otherwise, review of systems is negative unless per above HPI  Filed Vitals:   01/18/16 0512 01/18/16 0526  BP: 112/72   Pulse: 86   Temp: 97.4 F (36.3 C)   TempSrc: Temporal   Resp: 19    SpO2: 98% 100%   Wt Readings from Last 10 Encounters:  01/12/16 65.545 kg (144 lb 8 oz)  01/01/16 65.137 kg (143 lb 9.6 oz)  12/26/15 65.046 kg (143 lb 6.4 oz)  12/20/15 67.223 kg (148 lb 3.2 oz)   ECG: NSR with prominent ST elevation V3, subtle, possibly dynamic changes V4.   PE:  General: Mild distress HEENT: Atraumatic, EOMI, mucous membranes moist CV: RRR no murmurs, gallops. No JVD at 35 degrees. No HJR. Respiratory: Clear, no crackles. Normal work of breathing ABD: Non-distended and non-tender. No palpable organomegaly.  Extremities: 1+ radial pulses bilaterally. No edema. 2+ L femoral Neuro/Psych: CN grossly intact, alert and oriented  Assessment/Plan Mr. Wolz is a history with severe 3 vessel native disease s/p CABG presenting as a code STEMI. Prelim STEMI cath results show a LAD/LIMA anastomotic calcified lesion.  ECG repeated on cath table with improvement in ST segments, and patient pain improved.  Given changes and high risks associated with intervening on this lesion, decision made to medically manage.  Perhaps heparin helped dissolve an acute clot or some other small branch occluded not well visualized. Cath able to be done via left femoral artery with 12F sheath.   CAD and chest pain, s/p CABG 1987, all SVGs occluded. Code STEMI - LHC emergently showing anastomotic LAD/LIMA lesion - Continue ASA/Plavix - TTE  - Continue coreg 3.125 B - Continue Zetia 10/atorvastatin 80 - Routine L fem sheath removal protocol ---- Restart IV heparin after left femoral sheath is removed when safe from a bleeding standpoint per Dr. Allyson Sabal for medical management of ACS  Chronic systolic CHF: Ischemic cardiomyopathy. St  Jude ICD.  - continue lisinopril 2.5 mg - continue spironolactone 12.5 mg - Continue home lasix 40 mg po BID  Acute on chronic kidney injury - sprio and low dose ACE recently started.  Trend Crt, if any worsening, low threshold to discontinue and renally dose other  medications  PAD - known bilateral occluded SFA - No complaints of claudication at this time  History of VT arrest - ICD and continue amiodarone  Full Code No Hep PPX  Greig Castilla Patrena Santalucia  MD 01/18/2016, 5:28 AM

## 2016-01-18 NOTE — Progress Notes (Signed)
    Primary cardiologist: Dr. Marca Anconaalton McLean  Patient recently back from cardiac catheterization lab. I reviewed the history and physical from early this morning. Patient is comfortable at this time, no chest pain. Heart rate in the 80s, systolic blood pressure 90-100 range. Femoral sheath still in place. He has ischemic cardiomyopathy with St. Jude ICD in place, multivessel CAD status post CABG with known occlusion of all vein grafts, and anastomotic LIMA to LAD stenosis of 75% (unchanged from last angiography). I reviewed Dr. Hazle CocaBerry's cardiac catheterization report and recommendations. Medical therapy is planned at this time. Femoral sheath will be pulled with resumption of heparin after 4 hours per Dr. Allyson SabalBerry for management of ACS. Followup CBC, BMET, and ECG in AM.  Jonelle SidleSamuel G. Mar Walmer, M.D., F.A.C.C.

## 2016-01-18 NOTE — Progress Notes (Signed)
ANTICOAGULATION CONSULT NOTE  Pharmacy Consult for heparin Indication: chest pain/ACS  No Known Allergies  Patient Measurements: Height: 5' 9.5" (176.5 cm) Weight: 142 lb 3.2 oz (64.5 kg) IBW/kg (Calculated) : 71.85 Heparin Dosing Weight: 64.5 kg  Vital Signs: Temp: 97.9 F (36.6 C) (05/28 2322) Temp Source: Oral (05/28 2322) BP: 99/59 mmHg (05/28 2300) Pulse Rate: 79 (05/28 2322)  Labs:  Recent Labs  01/18/16 0456 01/18/16 0512 01/18/16 0647 01/18/16 1225 01/18/16 1832 01/18/16 2233  HGB 11.8* 13.6  --   --   --   --   HCT 37.3* 40.0  --   --   --   --   PLT 257  --   --   --   --   --   APTT 28  --   --   --   --   --   LABPROT 15.4*  --   --   --   --   --   INR 1.20  --   --   --   --   --   HEPARINUNFRC  --   --   --   --   --  0.16*  CREATININE 1.99* 2.00*  --   --   --   --   TROPONINI 0.16*  --  2.93* 25.97* 42.95*  --     Estimated Creatinine Clearance: 28.2 mL/min (by C-G formula based on Cr of 2).   Medical History: Past Medical History  Diagnosis Date  . MI (myocardial infarction) (HCC)   . GI bleed   . Hypertension   . Hypercholesteremia   . CAD (coronary artery disease)   . Sustained VT (ventricular tachycardia) (HCC) 12/15/2015  . Cardiac arrest [facility](HCC) secondary to sustained V tach requiring CPR 12/15/15 12/15/2015    s/p St. Jude Ellipse VR (serial Number [id number]) ICD  . Oxygen deficiency   . Glaucoma   . Heart murmur   . Arthritis   . Blood transfusion without reported diagnosis   . Cataract   . CHF (congestive heart failure) [facility](HCC)     Assessment: 80 yo M admitted 01/18/2016  with acute chest pain and diaphoresis. Patient taken as code STEMI to cath lab on 5/28. Pharmacy consulted to resume heparin 4 hours after sheath removal.  RN removing sheath ~0930. Hgb 13.6, Plt wnl. No s/sx of bleeding noted.  Initial HL is subtherapeutic at 0.16 on heparin 650 units/hr. Nurse reports no issues with infusion or bleeding.  Goal of Therapy:   Heparin level 0.3-0.7 units/ml Monitor platelets by anticoagulation protocol: Yes  Plan:  Increase heparin to 850 units/hr 8hr HL Monitor daily HL, CBC and s/sx of bleeding  Arlean Hoppingorey M. Newman PiesBall, PharmD, BCPS Clinical Pharmacist Pager [phone]  01/18/2016,11:30 PM

## 2016-01-18 NOTE — Progress Notes (Signed)
ANTICOAGULATION CONSULT NOTE - Initial Consult  Pharmacy Consult for heparin Indication: chest pain/ACS  No Known Allergies  Patient Measurements: Height: 5' 9.5" (176.5 cm) Weight: 142 lb 3.2 oz (64.5 kg) IBW/kg (Calculated) : 71.85 Heparin Dosing Weight: 64.5 kg  Vital Signs: Temp: 97.9 F (36.6 C) (05/28 0800) Temp Source: Oral (05/28 0602) BP: 91/59 mmHg (05/28 0700) Pulse Rate: 86 (05/28 0700)  Labs:  Recent Labs  01/18/16 0456 01/18/16 0512 01/18/16 0647  HGB 11.8* 13.6  --   HCT 37.3* 40.0  --   PLT 257  --   --   APTT 28  --   --   LABPROT 15.4*  --   --   INR 1.20  --   --   CREATININE 1.99* 2.00*  --   TROPONINI 0.16*  --  2.93*    Estimated Creatinine Clearance: 28.2 mL/min (by C-G formula based on Cr of 2).   Medical History: Past Medical History  Diagnosis Date  . MI (myocardial infarction) (HCC)   . GI bleed   . Hypertension   . Hypercholesteremia   . CAD (coronary artery disease)   . Sustained VT (ventricular tachycardia) (HCC) 12/15/2015  . Cardiac arrest [facility](HCC) secondary to sustained V tach requiring CPR 12/15/15 12/15/2015    s/p St. Jude Ellipse VR (serial Number [id number]) ICD  . Oxygen deficiency   . Glaucoma   . Heart murmur   . Arthritis   . Blood transfusion without reported diagnosis   . Cataract   . CHF (congestive heart failure) [facility](HCC)     Assessment: 80 yo M admitted 01/18/2016  with acute chest pain and diaphoresis. Patient taken as code STEMI to cath lab on 5/28. Pharmacy consulted to resume heparin 4 hours after sheath removal.  RN removing sheath ~0930. Hgb 13.6, Plt wnl. No s/sx of bleeding noted.  Goal of Therapy:  Heparin level 0.3-0.7 units/ml Monitor platelets by anticoagulation protocol: Yes  Plan:  - Resume heparin 4 hrs after sheath is removed (~1330) - Start heparin at 650 units/hr - Monitor 8hr HL - Monitor daily HL, CBC and s/sx of bleeding  Casilda Carlsaylor Hoorain Kozakiewicz, PharmD. PGY-1 Pharmacy Resident Pager:  [phone] 01/18/2016,9:31 AM

## 2016-01-18 NOTE — ED Notes (Signed)
Patient with chest pain, nausea, vomiting and sweating since 0200 this morning.  Patient states that the pain goes from his left shoulder down his left arm.  Patient is pale and diaphoretic in triage.

## 2016-01-19 DIAGNOSIS — I2102 ST elevation (STEMI) myocardial infarction involving left anterior descending coronary artery: Secondary | ICD-10-CM | POA: Diagnosis present

## 2016-01-19 DIAGNOSIS — E7800 Pure hypercholesterolemia, unspecified: Secondary | ICD-10-CM

## 2016-01-19 DIAGNOSIS — I350 Nonrheumatic aortic (valve) stenosis: Secondary | ICD-10-CM

## 2016-01-19 DIAGNOSIS — I5022 Chronic systolic (congestive) heart failure: Secondary | ICD-10-CM

## 2016-01-19 LAB — CBC
HCT: 31.8 % — ABNORMAL LOW (ref 39.0–52.0)
HEMOGLOBIN: 10.2 g/dL — AB (ref 13.0–17.0)
MCH: 27.8 pg (ref 26.0–34.0)
MCHC: 32.1 g/dL (ref 30.0–36.0)
MCV: 86.6 fL (ref 78.0–100.0)
Platelets: 249 10*3/uL (ref 150–400)
RBC: 3.67 MIL/uL — ABNORMAL LOW (ref 4.22–5.81)
RDW: 15.1 % (ref 11.5–15.5)
WBC: 9.7 10*3/uL (ref 4.0–10.5)

## 2016-01-19 LAB — HEPARIN LEVEL (UNFRACTIONATED)
HEPARIN UNFRACTIONATED: 0.43 [IU]/mL (ref 0.30–0.70)
Heparin Unfractionated: 0.38 IU/mL (ref 0.30–0.70)

## 2016-01-19 LAB — BASIC METABOLIC PANEL
Anion gap: 9 (ref 5–15)
BUN: 29 mg/dL — AB (ref 6–20)
CHLORIDE: 101 mmol/L (ref 101–111)
CO2: 25 mmol/L (ref 22–32)
CREATININE: 1.54 mg/dL — AB (ref 0.61–1.24)
Calcium: 8.9 mg/dL (ref 8.9–10.3)
GFR calc Af Amer: 48 mL/min — ABNORMAL LOW (ref >60)
GFR calc non Af Amer: 42 mL/min — ABNORMAL LOW (ref >60)
GLUCOSE: 94 mg/dL (ref 65–99)
Potassium: 4.6 mmol/L (ref 3.5–5.1)
SODIUM: 135 mmol/L (ref 135–145)

## 2016-01-19 MED ORDER — FUROSEMIDE 40 MG PO TABS
40.0000 mg | ORAL_TABLET | Freq: Every day | ORAL | Status: DC
  Administered 2016-01-19: 40 mg via ORAL

## 2016-01-19 MED ORDER — PANTOPRAZOLE SODIUM 40 MG PO TBEC
40.0000 mg | DELAYED_RELEASE_TABLET | Freq: Every day | ORAL | Status: DC
  Administered 2016-01-19 – 2016-01-21 (×3): 40 mg via ORAL
  Filled 2016-01-19 (×3): qty 1

## 2016-01-19 MED ORDER — ENSURE ENLIVE PO LIQD
237.0000 mL | Freq: Three times a day (TID) | ORAL | Status: DC
Start: 2016-01-19 — End: 2016-01-22
  Administered 2016-01-19 – 2016-01-21 (×6): 237 mL via ORAL

## 2016-01-19 NOTE — Progress Notes (Signed)
Initial Nutrition Assessment  DOCUMENTATION CODES:   Severe malnutrition in context of chronic illness  INTERVENTION:   Ensure Enlive po TID, each supplement provides 350 kcal and 20 grams of protein Encouraged small, frequent meals.   NUTRITION DIAGNOSIS:   Malnutrition (Severe) related to acute illness (recent GI bleed and STEMI) as evidenced by severe depletion of body fat, severe depletion of muscle mass, energy intake < or equal to 50% for > or equal to 5 days, 25 percent weight loss x 2 months.  GOAL:   Patient will meet greater than or equal to 90% of their needs  MONITOR:   PO intake, Supplement acceptance, I & O's, Weight trends  REASON FOR ASSESSMENT:   Consult  (Other: weight loss 45 lb since March)  ASSESSMENT:   Pt with history of CAD s/p CABG (all SVGs down), ischemic cardiomyopathy, and GI bleeding history who presents with acute anterior STEMI.  Per pt and family pt has lost 25% of his weight in the last 2 months. They report he has had a gi bleed and heart attack and has had no appetite. Family at bedside are attentive and report they have been offering him ensure. Family with questions about a protein supplement that is given to his son an athlete at [employer]. Left family with my email for questions.  Pt has only had bites of food for the last 2 months, not really eating any meals.  Nutrition-Focused physical exam completed. Findings are severe fat depletion, severe muscle depletion, and no edema.   Medications reviewed and include: ferrous sulfate, folic acid, lasix, aldactone, vitamin C    Diet Order:  Diet Heart Room service appropriate?: Yes; Fluid consistency:: Thin  Skin:  Reviewed, no issues  Last BM:  5/27  Height:   Ht Readings from Last 1 Encounters:  01/18/16 5' 9.5" (1.765 m)    Weight:   Wt Readings from Last 1 Encounters:  01/18/16 142 lb 3.2 oz (64.5 kg)    Ideal Body Weight:  74 kg  BMI:  Body mass index is 20.7  kg/(m^2).  Estimated Nutritional Needs:   Kcal:  1800-2000  Protein:  90-100 grams   Fluid:  >1.8 L/day  EDUCATION NEEDS:   Education needs addressed  Kendell BaneHeather Correen Bubolz RD, LDN, CNSC [phone] Pager [phone] After Hours Pager

## 2016-01-19 NOTE — Progress Notes (Addendum)
80 yo with history of CAD s/p CABG (all SVGs down), ischemic cardiomyopathy, and GI bleeding history who presents with acute anterior STEMI. He had CABG back in 1987. He also had an admission in [place] in 3/17 with GI bleeding.Records show this GI bleed was significant, requiring blood transfusions.Patient reports this was a lower GI bleed.  He since has been back on ASA 81 and Plavix. He was admitted to [facility] in 4/17 with acute on chronic systolic CHF. After hospitalization, he had a VT arrest. Echo showed EF 25-30% with moderately decreased RV function, moderate AS, and mod-severe MR. LHC was done, showing 3/4 occluded bypass grafts. LIMA-LAD was patent but had75% anastomotic disease. The RCA had severe mid vessel disease but was small in caliber, diffusely diseased, and very tortuous. Vascular access was very challenging requiring 31F catheters. He was medically managed with aspirin/plavix. RHC showed near-normal filling pressures. St Jude ICD was placed.  Patient presented 5/28 am with anterior STEMI. Taken emergently for cardiac cath with findings unchanged from prior. Medical management recommended.   TELEMETRY: Reviewed telemetry pt in NSR: Filed Vitals:   01/19/16 0300 01/19/16 0400 01/19/16 0500 01/19/16 0600  BP: 92/56 88/54 89/54  89/63  Pulse: 79 80 77 78  Temp:  98.2 F (36.8 C)    TempSrc:  Oral    Resp: 8 8 12 15   Height:      Weight:      SpO2: 98% 100% 99% 100%    Intake/Output Summary (Last 24 hours) at 01/19/16 0736 Last data filed at 01/19/16 0600  Gross per 24 hour  Intake 582.25 ml  Output   1200 ml  Net -617.75 ml   Filed Weights   01/18/16 0602  Weight: 64.5 kg (142 lb 3.2 oz)    Subjective Patient states he feels well. No recurrent chest pain since admission. No dyspnea. No arrhythmia noted on telemetry.   Marland Kitchen amiodarone  200 mg Oral Daily  . aspirin EC  81 mg Oral Daily  . atorvastatin  80 mg Oral Daily  . carvedilol  3.125 mg  Oral BID WC  . clopidogrel  75 mg Oral Daily  . ezetimibe  10 mg Oral Daily  . feeding supplement (ENSURE ENLIVE)  237 mL Oral BID BM  . ferrous sulfate  325 mg Oral TID WC  . folic acid  1 mg Oral Daily  . furosemide  40 mg Oral Daily  . latanoprost  1 drop Both Eyes QHS  . pantoprazole  40 mg Oral Q1200  . ranolazine  500 mg Oral BID  . sodium chloride flush  3 mL Intravenous Q12H  . spironolactone  12.5 mg Oral q morning - 10a  . timolol  1 drop Both Eyes BID  . vitamin C  500 mg Oral BID   . sodium chloride    . heparin 850 Units/hr (01/18/16 2335)    LABS: Basic Metabolic Panel:  Recent Labs  47/82/95 0456 01/18/16 0512 01/19/16 0230  NA 135 137 135  K 5.3* 5.1 4.6  CL 101 100* 101  CO2 25  --  25  GLUCOSE 128* 119* 94  BUN 30* 30* 29*  CREATININE 1.99* 2.00* 1.54*  CALCIUM 9.6  --  8.9   Liver Function Tests:  Recent Labs  01/18/16 0456  AST 27  ALT 30  ALKPHOS 51  BILITOT 0.7  PROT 7.5  ALBUMIN 3.6   No results for input(s): LIPASE, AMYLASE in the last 72 hours. CBC:  Recent Labs  01/18/16 0456 01/18/16 0512 01/19/16 0230  WBC 8.7  --  9.7  NEUTROABS 6.3  --   --   HGB 11.8* 13.6 10.2*  HCT 37.3* 40.0 31.8*  MCV 88.8  --  86.6  PLT 257  --  249   Cardiac Enzymes:  Recent Labs  01/18/16 0647 01/18/16 1225 01/18/16 1832  TROPONINI 2.93* 25.97* 42.95*   BNP: No results for input(s): PROBNP in the last 72 hours. D-Dimer: No results for input(s): DDIMER in the last 72 hours. Hemoglobin A1C: No results for input(s): HGBA1C in the last 72 hours. Fasting Lipid Panel:  Recent Labs  01/18/16 0456  CHOL 135  HDL 33*  LDLCALC 90  TRIG 58  CHOLHDL 4.1   Thyroid Function Tests: No results for input(s): TSH, T4TOTAL, T3FREE, THYROIDAB in the last 72 hours.  Invalid input(s): FREET3   Radiology/Studies:  Dg Chest Port 1 View  01/18/2016  CLINICAL DATA:  Chest pain today. EXAM: PORTABLE CHEST 1 VIEW COMPARISON:  Frontal and  lateral views 12/19/2015 FINDINGS: Single lead pacemaker remains in place. Patient is post median sternotomy. The patient is rotated. Heart at the upper limits normal in size. The left lower lobe consolidation on prior exam is no longer seen. There is no pulmonary edema. Underlying chronic interstitial markings again seen. No pneumothorax. Previous pleural effusions are not seen, left costophrenic angle excluded. IMPRESSION: Resolution of previous left lung consolidation and pleural effusions. No new or acute abnormalities are seen. Electronically Signed   By: Rubye OaksMelanie  Ehinger M.D.   On: 01/18/2016 05:57   Ecg today shows NSR, nonspecific IVCD. ST elevation V2-4 improved.   PHYSICAL EXAM General: Well developed, well nourished, in no acute distress. Head: Normocephalic, atraumatic, sclera non-icteric, oropharynx is clear Neck: Negative for carotid bruits. JVD not elevated. No adenopathy Lungs: Clear bilaterally to auscultation without wheezes, rales, or rhonchi. Breathing is unlabored. Heart: RRR S1 S2 with grade 2/6 harsh systolic murmur RUSB. Soft systolic murmur at apex. Abdomen: Soft, non-tender, non-distended with normoactive bowel sounds. No hepatomegaly. No rebound/guarding. No obvious abdominal masses. Msk:  Strength and tone appears normal for age. Extremities: No clubbing, cyanosis or edema.  Distal pedal pulses are 2+ and equal bilaterally. Left femoral access site without hematoma.  Neuro: Alert and oriented X 3. Moves all extremities spontaneously. Psych:  Responds to questions appropriately with a normal affect.  ASSESSMENT AND PLAN: 1. Anterior STEMI. Patient has high risk anatomy with occluded LAD and LCx. Severe diffuse disease in RCA. Only one graft patent with LIMA to LAD but with anastomotic lesion. This is his culprit based on Anterior ST elevation on Ecg. Since admission pain resolved. ST elevation improved. Troponin peak 42.95. I personally reviewed his angiograms. He has high  risk anatomy. He is not a candidate for hemodynamic support due to severe PAD. EF is low. Repeat Echo is pending. Antianginal therapy is limited due to low BP. Will continue IV heparin today. Advance activity. I think if he has recurrent chest pain we will need to consider high risk unprotected stenting of the LIMA to LAD anastomosis. 2. Chronic systolic CHF with ischemic cardiomyopathy. Repeat Echo is pending. Weight is down 6 lbs since April and no evidence of volume overload. With low BP will reduce lasix to 40 mg daily and hold lisinopril. Continue low dose Coreg and aldactone. 3. PAD 4. History of GI bleed. Hgb stable. Protonix po 5. History of VT arrest. S/p St Jude ICD. No arrhythmia on amiodarone.  6. Hypercholesterolemia. On high dose lipitor and Zetia.  7. Moderate AS 8. Moderate to severe MR.  Present on Admission:  . Acute combined systolic and diastolic heart failure (HCC) - unsure chronicity . Hypertension . Hypercholesteremia . ST elevation (STEMI) myocardial infarction involving left anterior descending coronary artery (HCC)  Signed, Peter [place], MDFACC 01/19/2016 7:36 AM

## 2016-01-19 NOTE — Progress Notes (Signed)
ANTICOAGULATION CONSULT NOTE -  Pharmacy Consult for heparin Indication: chest pain/ACS  No Known Allergies  Patient Measurements: Height: 5' 9.5" (176.5 cm) Weight: 142 lb 3.2 oz (64.5 kg) IBW/kg (Calculated) : 71.85 Heparin Dosing Weight: 64.5 kg  Vital Signs: Temp: 98.7 F (37.1 C) (05/29 1900) Temp Source: Oral (05/29 1900) BP: 118/74 mmHg (05/29 1900) Pulse Rate: 89 (05/29 1900)  Labs:  Recent Labs  01/18/16 0456 01/18/16 0512 01/18/16 0647 01/18/16 1225 01/18/16 1832 01/18/16 2233 01/19/16 0230 01/19/16 0757 01/19/16 1910  HGB 11.8* 13.6  --   --   --   --  10.2*  --   --   HCT 37.3* 40.0  --   --   --   --  31.8*  --   --   PLT 257  --   --   --   --   --  249  --   --   APTT 28  --   --   --   --   --   --   --   --   LABPROT 15.4*  --   --   --   --   --   --   --   --   INR 1.20  --   --   --   --   --   --   --   --   HEPARINUNFRC  --   --   --   --   --  0.16*  --  0.43 0.38  CREATININE 1.99* 2.00*  --   --   --   --  1.54*  --   --   TROPONINI 0.16*  --  2.93* 25.97* 42.95*  --   --   --   --     Estimated Creatinine Clearance: 36.6 mL/min (by C-G formula based on Cr of 1.54).   Medical History: Past Medical History  Diagnosis Date  . MI (myocardial infarction) (HCC)   . GI bleed   . Hypertension   . Hypercholesteremia   . CAD (coronary artery disease)   . Sustained VT (ventricular tachycardia) (HCC) 12/15/2015  . Cardiac arrest [facility](HCC) secondary to sustained V tach requiring CPR 12/15/15 12/15/2015    s/p St. Jude Ellipse VR (serial Number [id number]) ICD  . Oxygen deficiency   . Glaucoma   . Heart murmur   . Arthritis   . Blood transfusion without reported diagnosis   . Cataract   . CHF (congestive heart failure) [facility](HCC)     Assessment: 80 yo M admitted 01/18/2016  with acute chest pain and diaphoresis. Patient taken as code STEMI to cath lab on 5/28. Pharmacy consulted to resume heparin after cath on 5/28.  HL 0.38 (therapeutic), Hgb 10.2,  Plt wnl. No s/sx of bleeding noted.  Goal of Therapy:  Heparin level 0.3-0.7 units/ml Monitor platelets by anticoagulation protocol: Yes  Plan:  - Continue heparin at 850 units/hr - Monitor daily HL, CBC and s/sx of bleeding  Tad MooreJessica Kristyna Bradstreet, Pharm D, BCPS  Clinical Pharmacist Pager [phone]  01/19/2016 8:09 PM

## 2016-01-19 NOTE — Progress Notes (Signed)
ANTICOAGULATION CONSULT NOTE - Initial Consult  Pharmacy Consult for heparin Indication: chest pain/ACS  No Known Allergies  Patient Measurements: Height: 5' 9.5" (176.5 cm) Weight: 142 lb 3.2 oz (64.5 kg) IBW/kg (Calculated) : 71.85 Heparin Dosing Weight: 64.5 kg  Vital Signs: Temp: 98 F (36.7 C) (05/29 0801) Temp Source: Oral (05/29 0801) BP: 99/53 mmHg (05/29 1000) Pulse Rate: 83 (05/29 1000)  Labs:  Recent Labs  01/18/16 0456 01/18/16 0512 01/18/16 0647 01/18/16 1225 01/18/16 1832 01/18/16 2233 01/19/16 0230 01/19/16 0757  HGB 11.8* 13.6  --   --   --   --  10.2*  --   HCT 37.3* 40.0  --   --   --   --  31.8*  --   PLT 257  --   --   --   --   --  249  --   APTT 28  --   --   --   --   --   --   --   LABPROT 15.4*  --   --   --   --   --   --   --   INR 1.20  --   --   --   --   --   --   --   HEPARINUNFRC  --   --   --   --   --  0.16*  --  0.43  CREATININE 1.99* 2.00*  --   --   --   --  1.54*  --   TROPONINI 0.16*  --  2.93* 25.97* 42.95*  --   --   --     Estimated Creatinine Clearance: 36.6 mL/min (by C-G formula based on Cr of 1.54).   Medical History: Past Medical History  Diagnosis Date  . MI (myocardial infarction) (HCC)   . GI bleed   . Hypertension   . Hypercholesteremia   . CAD (coronary artery disease)   . Sustained VT (ventricular tachycardia) (HCC) 12/15/2015  . Cardiac arrest [facility](HCC) secondary to sustained V tach requiring CPR 12/15/15 12/15/2015    s/p St. Jude Ellipse VR (serial Number [id number]) ICD  . Oxygen deficiency   . Glaucoma   . Heart murmur   . Arthritis   . Blood transfusion without reported diagnosis   . Cataract   . CHF (congestive heart failure) [facility](HCC)     Assessment: 80 yo M admitted 01/18/2016  with acute chest pain and diaphoresis. Patient taken as code STEMI to cath lab on 5/28. Pharmacy consulted to resume heparin after cath on 5/28.  HL 0.43 (therapeutic), Hgb 10.2, Plt wnl. No s/sx of bleeding noted.  Goal of  Therapy:  Heparin level 0.3-0.7 units/ml Monitor platelets by anticoagulation protocol: Yes  Plan:  - Continue heparin at 850 units/hr - Monitor 8hr HL - Monitor daily HL, CBC and s/sx of bleeding  Casilda Carlsaylor Trestan Vahle, PharmD. PGY-1 Pharmacy Resident Pager: [phone] 01/19/2016,10:55 AM

## 2016-01-20 ENCOUNTER — Encounter (HOSPITAL_COMMUNITY)
Admission: EM | Disposition: A | Discharge status: Home / Self Care | Payer: Self-pay | Source: Home / Self Care | Attending: Cardiovascular Disease

## 2016-01-20 ENCOUNTER — Inpatient Hospital Stay (HOSPITAL_COMMUNITY): Payer: Medicare Other

## 2016-01-20 ENCOUNTER — Encounter (HOSPITAL_COMMUNITY): Payer: Self-pay | Admitting: Cardiovascular Disease

## 2016-01-20 DIAGNOSIS — R079 Chest pain, unspecified: Secondary | ICD-10-CM

## 2016-01-20 DIAGNOSIS — N179 Acute kidney failure, unspecified: Secondary | ICD-10-CM

## 2016-01-20 DIAGNOSIS — N189 Chronic kidney disease, unspecified: Secondary | ICD-10-CM

## 2016-01-20 DIAGNOSIS — I25810 Atherosclerosis of coronary artery bypass graft(s) without angina pectoris: Secondary | ICD-10-CM

## 2016-01-20 HISTORY — PX: ULTRASOUND GUIDANCE FOR VASCULAR ACCESS: SHX6516

## 2016-01-20 HISTORY — PX: CARDIAC CATHETERIZATION: SHX172

## 2016-01-20 LAB — BASIC METABOLIC PANEL
Anion gap: 9 (ref 5–15)
BUN: 30 mg/dL — AB (ref 6–20)
CHLORIDE: 101 mmol/L (ref 101–111)
CO2: 25 mmol/L (ref 22–32)
CREATININE: 1.35 mg/dL — AB (ref 0.61–1.24)
Calcium: 9.1 mg/dL (ref 8.9–10.3)
GFR calc Af Amer: 57 mL/min — ABNORMAL LOW (ref >60)
GFR calc non Af Amer: 49 mL/min — ABNORMAL LOW (ref >60)
GLUCOSE: 89 mg/dL (ref 65–99)
POTASSIUM: 4.5 mmol/L (ref 3.5–5.1)
SODIUM: 135 mmol/L (ref 135–145)

## 2016-01-20 LAB — HEPARIN LEVEL (UNFRACTIONATED): Heparin Unfractionated: 0.28 IU/mL — ABNORMAL LOW (ref 0.30–0.70)

## 2016-01-20 LAB — CBC
HEMATOCRIT: 31.8 % — AB (ref 39.0–52.0)
Hemoglobin: 10.2 g/dL — ABNORMAL LOW (ref 13.0–17.0)
MCH: 27.8 pg (ref 26.0–34.0)
MCHC: 32.1 g/dL (ref 30.0–36.0)
MCV: 86.6 fL (ref 78.0–100.0)
PLATELETS: 230 10*3/uL (ref 150–400)
RBC: 3.67 MIL/uL — ABNORMAL LOW (ref 4.22–5.81)
RDW: 15.1 % (ref 11.5–15.5)
WBC: 9.6 10*3/uL (ref 4.0–10.5)

## 2016-01-20 LAB — POCT ACTIVATED CLOTTING TIME: ACTIVATED CLOTTING TIME: 574 s

## 2016-01-20 LAB — ECHOCARDIOGRAM COMPLETE
HEIGHTINCHES: 69.5 in
Weight: 2275.15 oz

## 2016-01-20 SURGERY — CORONARY STENT INTERVENTION

## 2016-01-20 MED ORDER — LIDOCAINE HCL (PF) 1 % IJ SOLN
INTRAMUSCULAR | Status: AC
  Filled 2016-01-20: qty 30

## 2016-01-20 MED ORDER — ATROPINE SULFATE 1 MG/10ML IJ SOSY
PREFILLED_SYRINGE | INTRAMUSCULAR | Status: AC
  Filled 2016-01-20: qty 10

## 2016-01-20 MED ORDER — FENTANYL CITRATE (PF) 100 MCG/2ML IJ SOLN
INTRAMUSCULAR | Status: DC | PRN
  Administered 2016-01-20 (×2): 25 ug via INTRAVENOUS

## 2016-01-20 MED ORDER — LIDOCAINE HCL (PF) 1 % IJ SOLN
INTRAMUSCULAR | Status: DC | PRN
  Administered 2016-01-20 (×2): 20 mL via INTRADERMAL

## 2016-01-20 MED ORDER — FENTANYL CITRATE (PF) 100 MCG/2ML IJ SOLN
INTRAMUSCULAR | Status: AC
  Filled 2016-01-20: qty 2

## 2016-01-20 MED ORDER — SODIUM CHLORIDE 0.9% FLUSH
3.0000 mL | Freq: Two times a day (BID) | INTRAVENOUS | Status: DC
  Administered 2016-01-20 – 2016-01-21 (×3): 3 mL via INTRAVENOUS

## 2016-01-20 MED ORDER — SODIUM CHLORIDE 0.9 % IV SOLN: 250.0000 mL | INTRAVENOUS | Status: DC | PRN

## 2016-01-20 MED ORDER — MIDAZOLAM HCL 2 MG/2ML IJ SOLN
INTRAMUSCULAR | Status: AC
  Filled 2016-01-20: qty 2

## 2016-01-20 MED ORDER — IOPAMIDOL (ISOVUE-370) INJECTION 76%
INTRAVENOUS | Status: AC
  Filled 2016-01-20: qty 125

## 2016-01-20 MED ORDER — MIDAZOLAM HCL 2 MG/2ML IJ SOLN
INTRAMUSCULAR | Status: DC | PRN
  Administered 2016-01-20 (×2): 1 mg via INTRAVENOUS

## 2016-01-20 MED ORDER — HEPARIN (PORCINE) IN NACL 2-0.9 UNIT/ML-% IJ SOLN
INTRAMUSCULAR | Status: DC | PRN
  Administered 2016-01-20: 1000 mL

## 2016-01-20 MED ORDER — SODIUM CHLORIDE 0.9% FLUSH: 3.0000 mL | INTRAVENOUS | Status: DC | PRN

## 2016-01-20 MED ORDER — SODIUM CHLORIDE 0.9 % IV SOLN
250.0000 mg | INTRAVENOUS | Status: DC | PRN
  Administered 2016-01-20: 1.75 mg/kg/h via INTRAVENOUS

## 2016-01-20 MED ORDER — SODIUM CHLORIDE 0.9% FLUSH
3.0000 mL | Freq: Two times a day (BID) | INTRAVENOUS | Status: DC
Start: 2016-01-20 — End: 2016-01-20

## 2016-01-20 MED ORDER — FUROSEMIDE 40 MG PO TABS: 40.0000 mg | ORAL_TABLET | Freq: Every day | ORAL | Status: DC

## 2016-01-20 MED ORDER — IOPAMIDOL (ISOVUE-370) INJECTION 76%
INTRAVENOUS | Status: DC | PRN
  Administered 2016-01-20: 55 mL via INTRA_ARTERIAL

## 2016-01-20 MED ORDER — HEPARIN (PORCINE) IN NACL 2-0.9 UNIT/ML-% IJ SOLN
INTRAMUSCULAR | Status: AC
  Filled 2016-01-20: qty 1000

## 2016-01-20 MED ORDER — SODIUM CHLORIDE 0.9% FLUSH
3.0000 mL | Freq: Two times a day (BID) | INTRAVENOUS | Status: DC
  Administered 2016-01-20 – 2016-01-21 (×2): 3 mL via INTRAVENOUS

## 2016-01-20 MED ORDER — SODIUM CHLORIDE 0.9 % IV SOLN
INTRAVENOUS | Status: DC
  Administered 2016-01-20: 09:00:00 via INTRAVENOUS

## 2016-01-20 MED ORDER — BIVALIRUDIN BOLUS VIA INFUSION - CUPID
INTRAVENOUS | Status: DC | PRN
  Administered 2016-01-20: 47.925 mg via INTRAVENOUS

## 2016-01-20 MED ORDER — BIVALIRUDIN 250 MG IV SOLR
INTRAVENOUS | Status: AC
  Filled 2016-01-20: qty 250

## 2016-01-20 MED ORDER — ASPIRIN 81 MG PO CHEW
81.0000 mg | CHEWABLE_TABLET | ORAL | Status: AC
  Administered 2016-01-20: 81 mg via ORAL
  Filled 2016-01-20: qty 1

## 2016-01-20 MED FILL — Heparin Sodium (Porcine) 2 Unit/ML in Sodium Chloride 0.9%: INTRAMUSCULAR | Qty: 1000 | Status: AC

## 2016-01-20 MED FILL — Nitroglycerin IV Soln 100 MCG/ML in D5W: INTRA_ARTERIAL | Qty: 10 | Status: AC

## 2016-01-20 SURGICAL SUPPLY — 23 items
BALLN TREK OTW 2.5X12 (BALLOONS)
BALLN TREK RX 2.5X12 (BALLOONS) ×4
BALLN ~~LOC~~ EUPHORA RX 2.75X8 (BALLOONS) ×4
BALLOON TREK OTW 2.5X12 (BALLOONS) IMPLANT
BALLOON TREK RX 2.5X12 (BALLOONS) ×2 IMPLANT
BALLOON ~~LOC~~ EUPHORA RX 2.75X8 (BALLOONS) ×2 IMPLANT
COVER PRB 48X5XTLSCP FOLD TPE (BAG) ×2 IMPLANT
COVER PROBE 5X48 (BAG) ×2
ELECT DEFIB PAD ADLT CADENCE (PAD) ×4 IMPLANT
GUIDE CATH RUNWAY 6FR IM SH (CATHETERS) ×4 IMPLANT
KIT ENCORE 26 ADVANTAGE (KITS) ×4 IMPLANT
KIT HEART LEFT (KITS) ×4 IMPLANT
NEEDLE SMART 18GX3.5CM LONG (NEEDLE) ×4 IMPLANT
PACK CARDIAC CATHETERIZATION (CUSTOM PROCEDURE TRAY) ×4 IMPLANT
SET INTRODUCER MICROPUNCT 5F (INTRODUCER) ×4 IMPLANT
SHEATH BRITE TIP 6FR 35CM (SHEATH) ×4 IMPLANT
SHEATH PINNACLE 6F 10CM (SHEATH) ×4 IMPLANT
STENT PROMUS PREM MR 2.75X12 (Permanent Stent) ×4 IMPLANT
TRANSDUCER W/STOPCOCK (MISCELLANEOUS) ×4 IMPLANT
TUBING CIL FLEX 10 FLL-RA (TUBING) ×4 IMPLANT
WIRE ASAHI PROWATER 180CM (WIRE) ×4 IMPLANT
WIRE EMERALD 3MM-J .035X150CM (WIRE) ×4 IMPLANT
WIRE HI TORQ VERSACORE-J 145CM (WIRE) ×4 IMPLANT

## 2016-01-20 NOTE — Progress Notes (Signed)
Right femoral arterial sheath ready to remove.  Pt instructions given, pt verbalizes understanding.  Sheath removed at 1848 with manual pressure applied to site for 30 minutes. Halina MaidensMorin Ande second RN present. Vital signs remained stable during procedure. Right femoral site soft without evidence of bruising or hematoma.  Pt instructions given and pt verbalizes understanding.  Dressing applied.  Bedside hand-off report given to next shift.

## 2016-01-20 NOTE — Interval H&P Note (Signed)
History and Physical Interval Note:  01/20/2016 3:02 PM  Andre Wilson  has presented today for surgery, with the diagnosis of cad  The various methods of treatment have been discussed with the patient and family. After consideration of risks, benefits and other options for treatment, the patient has consented to  Procedure(s): Coronary Stent Intervention (N/A) as a surgical intervention .  The patient's history has been reviewed, patient examined, no change in status, stable for surgery.  I have reviewed the patient's chart and labs.  Questions were answered to the patient's satisfaction.    Cath Lab Visit (complete for each Cath Lab visit)  Clinical Evaluation Leading to the Procedure:   ACS: Yes.    Non-ACS:    Anginal Classification: CCS IV  Anti-ischemic medical therapy: Maximal Therapy (2 or more classes of medications)  Non-Invasive Test Results: No non-invasive testing performed  Prior CABG: Previous CABG       Theron Aristaeter Shasta Eye Surgeons [email or web address] 01/20/2016 3:02 PM

## 2016-01-20 NOTE — Progress Notes (Signed)
ANTICOAGULATION CONSULT NOTE  Pharmacy Consult for heparin Indication: chest pain/ACS  No Known Allergies  Patient Measurements: Height: 5' 9.5" (176.5 cm) Weight: 142 lb 3.2 oz (64.5 kg) IBW/kg (Calculated) : 71.85 Heparin Dosing Weight: 64.5 kg  Vital Signs: Temp: 98.6 F (37 C) (05/29 2300) Temp Source: Oral (05/29 2300) BP: 87/57 mmHg (05/30 0300) Pulse Rate: 75 (05/30 0300)  Labs:  Recent Labs  01/18/16 0456 01/18/16 0512 01/18/16 0647 01/18/16 1225 01/18/16 1832  01/19/16 0230 01/19/16 0757 01/19/16 1910 01/20/16 0306  HGB 11.8* 13.6  --   --   --   --  10.2*  --   --  10.2*  HCT 37.3* 40.0  --   --   --   --  31.8*  --   --  31.8*  PLT 257  --   --   --   --   --  249  --   --  230  APTT 28  --   --   --   --   --   --   --   --   --   LABPROT 15.4*  --   --   --   --   --   --   --   --   --   INR 1.20  --   --   --   --   --   --   --   --   --   HEPARINUNFRC  --   --   --   --   --   < >  --  0.43 0.38 0.28*  CREATININE 1.99* 2.00*  --   --   --   --  1.54*  --   --  1.35*  TROPONINI 0.16*  --  2.93* 25.97* 42.95*  --   --   --   --   --   < > = values in this interval not displayed.  Estimated Creatinine Clearance: 41.8 mL/min (by C-G formula based on Cr of 1.35).   Medical History: Past Medical History  Diagnosis Date  . MI (myocardial infarction) (HCC)   . GI bleed   . Hypertension   . Hypercholesteremia   . CAD (coronary artery disease)   . Sustained VT (ventricular tachycardia) (HCC) 12/15/2015  . Cardiac arrest [facility](HCC) secondary to sustained V tach requiring CPR 12/15/15 12/15/2015    s/p St. Jude Ellipse VR (serial Number [id number]) ICD  . Oxygen deficiency   . Glaucoma   . Heart murmur   . Arthritis   . Blood transfusion without reported diagnosis   . Cataract   . CHF (congestive heart failure) [facility](HCC)     Assessment: 80 yo M admitted 01/18/2016  with acute chest pain and diaphoresis. Patient taken as code STEMI to cath lab on 5/28.  Pharmacy consulted to resume heparin after cath on 5/28.  HL 0.28 on heparin 850 units/hr. Nurse reports no issues with infusion or bleeding.  Goal of Therapy:  Heparin level 0.3-0.7 units/ml Monitor platelets by anticoagulation protocol: Yes  Plan:  Increase heparin to 900 units/hr 8h HL Monitor daily HL, CBC and s/sx of bleeding  Arlean Hoppingorey M. Newman PiesBall, PharmD, BCPS Clinical Pharmacist Pager [phone] 01/20/2016 4:20 AM

## 2016-01-20 NOTE — Progress Notes (Signed)
  Echocardiogram 2D Echocardiogram has been performed.  Leta JunglingCooper, Loren Sawaya M 01/20/2016, 10:35 AM

## 2016-01-20 NOTE — H&P (View-Only) (Signed)
Patient ID: Andre Wilson, male   DOB: 30-May-1936, 80 y.o.   MRN: [id number]   SUBJECTIVE: No chest pain but he did very little yesterday (did not walk in halls).  No dyspnea. Creatinine improved.   ECG shows residual anterior ST elevation.   Scheduled Meds: . amiodarone  200 mg Oral Daily  . aspirin EC  81 mg Oral Daily  . atorvastatin  80 mg Oral Daily  . carvedilol  3.125 mg Oral BID WC  . clopidogrel  75 mg Oral Daily  . ezetimibe  10 mg Oral Daily  . feeding supplement (ENSURE ENLIVE)  237 mL Oral TID BM  . ferrous sulfate  325 mg Oral TID WC  . folic acid  1 mg Oral Daily  . [START ON 01/21/2016] furosemide  40 mg Oral Daily  . latanoprost  1 drop Both Eyes QHS  . pantoprazole  40 mg Oral Q1200  . ranolazine  500 mg Oral BID  . sodium chloride flush  3 mL Intravenous Q12H  . spironolactone  12.5 mg Oral q morning - 10a  . timolol  1 drop Both Eyes BID  . vitamin C  500 mg Oral BID   Continuous Infusions: . sodium chloride    . heparin 900 Units/hr (01/20/16 0422)   PRN Meds:.sodium chloride, acetaminophen, linaclotide, magnesium hydroxide, morphine injection, nitroGLYCERIN, ondansetron (ZOFRAN) IV, oxyCODONE-acetaminophen, promethazine, sodium chloride flush, traZODone    Filed Vitals:   01/20/16 0300 01/20/16 0400 01/20/16 0500 01/20/16 0600  BP: 87/57 107/58 95/51 99/62   Pulse: 75 81 77 87  Temp:  98.5 F (36.9 C)    TempSrc:  Oral    Resp: Height:      Weight:      SpO2: 99% 100% 97% 100%    Intake/Output Summary (Last 24 hours) at 01/20/16 0754 Last data filed at 01/20/16 0600  Gross per 24 hour  Intake 1287.82 ml  Output   1075 ml  Net 212.82 ml    LABS: Basic Metabolic Panel:  Recent Labs  62/13/08 0230 01/20/16 0306  NA 135 135  K 4.6 4.5  CL 101 101  CO2 25 25  GLUCOSE 94 89  BUN 29* 30*  CREATININE 1.54* 1.35*  CALCIUM 8.9 9.1   Liver Function Tests:  Recent Labs  01/18/16 0456  AST 27  ALT 30  ALKPHOS 51  BILITOT  0.7  PROT 7.5  ALBUMIN 3.6   No results for input(s): LIPASE, AMYLASE in the last 72 hours. CBC:  Recent Labs  01/18/16 0456  01/19/16 0230 01/20/16 0306  WBC 8.7  --  9.7 9.6  NEUTROABS 6.3  --   --   --   HGB 11.8*  < > 10.2* 10.2*  HCT 37.3*  < > 31.8* 31.8*  MCV 88.8  --  86.6 86.6  PLT 257  --  249 230  < > = values in this interval not displayed. Cardiac Enzymes:  Recent Labs  01/18/16 0647 01/18/16 1225 01/18/16 1832  TROPONINI 2.93* 25.97* 42.95*   BNP: Invalid input(s): POCBNP D-Dimer: No results for input(s): DDIMER in the last 72 hours. Hemoglobin A1C: No results for input(s): HGBA1C in the last 72 hours. Fasting Lipid Panel:  Recent Labs  01/18/16 0456  CHOL 135  HDL 33*  LDLCALC 90  TRIG 58  CHOLHDL 4.1   Thyroid Function Tests: No results for input(s): TSH, T4TOTAL, T3FREE, THYROIDAB in the last 72 hours.  Invalid input(s): FREET3 Anemia  Panel: No results for input(s): VITAMINB12, FOLATE, FERRITIN, TIBC, IRON, RETICCTPCT in the last 72 hours.  RADIOLOGY: Dg Chest Port 1 View  01/18/2016  CLINICAL DATA:  Chest pain today. EXAM: PORTABLE CHEST 1 VIEW COMPARISON:  Frontal and lateral views 12/19/2015 FINDINGS: Single lead pacemaker remains in place. Patient is post median sternotomy. The patient is rotated. Heart at the upper limits normal in size. The left lower lobe consolidation on prior exam is no longer seen. There is no pulmonary edema. Underlying chronic interstitial markings again seen. No pneumothorax. Previous pleural effusions are not seen, left costophrenic angle excluded. IMPRESSION: Resolution of previous left lung consolidation and pleural effusions. No new or acute abnormalities are seen. Electronically Signed   By: Rubye OaksMelanie  Ehinger M.D.   On: 01/18/2016 05:57    PHYSICAL EXAM General: NAD Neck: JVP 7-8 cm, no thyromegaly or thyroid nodule.  Lungs: Clear to auscultation bilaterally with normal respiratory effort. CV: Lateral PMI.   Heart regular S1/S2, no S3/S4, 2/6 HSM apex and SEM RUSB.  No peripheral edema.  No carotid bruit.  Absent pedal pulses.  Abdomen: Soft, nontender, no hepatosplenomegaly, no distention.  Neurologic: Alert and oriented x 3.  Psych: Normal affect. Extremities: No clubbing or cyanosis.   TELEMETRY: Reviewed telemetry pt in NSR  ASSESSMENT AND PLAN: 80 yo with history of CAD s/p CABG, PAD, and systolic CHF was admitted with anterior STEMI.  1. CAD: s/p CABG 1987. 4/17 admitted with VT arrest and cath showed all SVGs occluded. The LIMA-LAD had a 70-75% anastomotic lesion. He was medically managed.  This admission had anterior ST elevation and chest pain, troponin to 45. Similar anatomy with diffusely diseased RCA, totally occluded LCx and LAD, and 75% anastomotic lesion at LIMA touchdown.  Touchdown lesion was likely the culprit.  His last 2 admissions have likely been ischemia-related, VT arrest in 4/17 and anterior STEMI this time.  I am concerned that in the absence of any intervention, this pattern will continue and there is little we can do about it.  He is very tenuous.  Difficult vascular access so would not be candidate for hemodynamic support.  I have discussed situation with Dr [place], could potentially intervene on the LIMA-LAD anastomotic lesion.  Will see if we can arrange for today.  - Continue ASA 81 and Plavix.  - Continue atorvastatin.   - He is on Ranexa - Continue heparin gtt for now - Possible PCI today, will see if we can work it out.  2. Chronic systolic CHF: Ischemic cardiomyopathy. EF 20-25% on 4/17 echo. On exam, he does not appear volume overloaded.  - Continue Coreg 3.125 mg bid and spironolactone.  - Hold lisinopril and Lasix today with possible intervention, can restart tomorrow.   - Repeat echo ordered.  3. PAD: Leg weakness is a major complaint. 11/16 peripheral angiography per report showed bilateral SFA occlusions. Peripheral arterial dopplers in our  system in 5/17 also showed bilateral SFA occlusions. Difficult vascular access noted on cath over weekend, left femoral used.  Not candidate therefore for hemodynamic support with PCI.  4. VT: 4/17 admission, now has ICD and is on amiodarone.   5. Anemia: GI bleeding of uncertain etiology in 3/17. Hemoglobin now stable.  6. Aortic stenosis: Appeared moderate on last echo.  7. Mitral regurgitation: Moderate to severe, likely infarct-related.  8. AAA: 4.5 cm in 5/17.  9. AKI on CKD: Resolved, creatinine now down to 1.3.  Holding Lasix and very gentle hydration today pre-PCI.  Marca Ancona 01/20/2016 8:09 AM

## 2016-01-20 NOTE — Care Management Important Message (Signed)
Important Message  Patient Details  Name: Milderd Meagerelson Stegman MRN: [id number] Date of Birth: Apr 12, 1936   Medicare Important Message Given:  Yes    Bernadette HoitShoffner, Anastasio Wogan Coleman 01/20/2016, 7:55 AM

## 2016-01-20 NOTE — Progress Notes (Addendum)
Patient ID: Andre Wilson, male   DOB: 30-May-1936, 80 y.o.   MRN: [id number]   SUBJECTIVE: No chest pain but he did very little yesterday (did not walk in halls).  No dyspnea. Creatinine improved.   ECG shows residual anterior ST elevation.   Scheduled Meds: . amiodarone  200 mg Oral Daily  . aspirin EC  81 mg Oral Daily  . atorvastatin  80 mg Oral Daily  . carvedilol  3.125 mg Oral BID WC  . clopidogrel  75 mg Oral Daily  . ezetimibe  10 mg Oral Daily  . feeding supplement (ENSURE ENLIVE)  237 mL Oral TID BM  . ferrous sulfate  325 mg Oral TID WC  . folic acid  1 mg Oral Daily  . [START ON 01/21/2016] furosemide  40 mg Oral Daily  . latanoprost  1 drop Both Eyes QHS  . pantoprazole  40 mg Oral Q1200  . ranolazine  500 mg Oral BID  . sodium chloride flush  3 mL Intravenous Q12H  . spironolactone  12.5 mg Oral q morning - 10a  . timolol  1 drop Both Eyes BID  . vitamin C  500 mg Oral BID   Continuous Infusions: . sodium chloride    . heparin 900 Units/hr (01/20/16 0422)   PRN Meds:.sodium chloride, acetaminophen, linaclotide, magnesium hydroxide, morphine injection, nitroGLYCERIN, ondansetron (ZOFRAN) IV, oxyCODONE-acetaminophen, promethazine, sodium chloride flush, traZODone    Filed Vitals:   01/20/16 0300 01/20/16 0400 01/20/16 0500 01/20/16 0600  BP: 87/57 107/58 95/51 99/62   Pulse: 75 81 77 87  Temp:  98.5 F (36.9 C)    TempSrc:  Oral    Resp: Height:      Weight:      SpO2: 99% 100% 97% 100%    Intake/Output Summary (Last 24 hours) at 01/20/16 0754 Last data filed at 01/20/16 0600  Gross per 24 hour  Intake 1287.82 ml  Output   1075 ml  Net 212.82 ml    LABS: Basic Metabolic Panel:  Recent Labs  62/13/08 0230 01/20/16 0306  NA 135 135  K 4.6 4.5  CL 101 101  CO2 25 25  GLUCOSE 94 89  BUN 29* 30*  CREATININE 1.54* 1.35*  CALCIUM 8.9 9.1   Liver Function Tests:  Recent Labs  01/18/16 0456  AST 27  ALT 30  ALKPHOS 51  BILITOT  0.7  PROT 7.5  ALBUMIN 3.6   No results for input(s): LIPASE, AMYLASE in the last 72 hours. CBC:  Recent Labs  01/18/16 0456  01/19/16 0230 01/20/16 0306  WBC 8.7  --  9.7 9.6  NEUTROABS 6.3  --   --   --   HGB 11.8*  < > 10.2* 10.2*  HCT 37.3*  < > 31.8* 31.8*  MCV 88.8  --  86.6 86.6  PLT 257  --  249 230  < > = values in this interval not displayed. Cardiac Enzymes:  Recent Labs  01/18/16 0647 01/18/16 1225 01/18/16 1832  TROPONINI 2.93* 25.97* 42.95*   BNP: Invalid input(s): POCBNP D-Dimer: No results for input(s): DDIMER in the last 72 hours. Hemoglobin A1C: No results for input(s): HGBA1C in the last 72 hours. Fasting Lipid Panel:  Recent Labs  01/18/16 0456  CHOL 135  HDL 33*  LDLCALC 90  TRIG 58  CHOLHDL 4.1   Thyroid Function Tests: No results for input(s): TSH, T4TOTAL, T3FREE, THYROIDAB in the last 72 hours.  Invalid input(s): FREET3 Anemia  Panel: No results for input(s): VITAMINB12, FOLATE, FERRITIN, TIBC, IRON, RETICCTPCT in the last 72 hours.  RADIOLOGY: Dg Chest Port 1 View  01/18/2016  CLINICAL DATA:  Chest pain today. EXAM: PORTABLE CHEST 1 VIEW COMPARISON:  Frontal and lateral views 12/19/2015 FINDINGS: Single lead pacemaker remains in place. Patient is post median sternotomy. The patient is rotated. Heart at the upper limits normal in size. The left lower lobe consolidation on prior exam is no longer seen. There is no pulmonary edema. Underlying chronic interstitial markings again seen. No pneumothorax. Previous pleural effusions are not seen, left costophrenic angle excluded. IMPRESSION: Resolution of previous left lung consolidation and pleural effusions. No new or acute abnormalities are seen. Electronically Signed   By: Rubye OaksMelanie  Ehinger M.D.   On: 01/18/2016 05:57    PHYSICAL EXAM General: NAD Neck: JVP 7-8 cm, no thyromegaly or thyroid nodule.  Lungs: Clear to auscultation bilaterally with normal respiratory effort. CV: Lateral PMI.   Heart regular S1/S2, no S3/S4, 2/6 HSM apex and SEM RUSB.  No peripheral edema.  No carotid bruit.  Absent pedal pulses.  Abdomen: Soft, nontender, no hepatosplenomegaly, no distention.  Neurologic: Alert and oriented x 3.  Psych: Normal affect. Extremities: No clubbing or cyanosis.   TELEMETRY: Reviewed telemetry pt in NSR  ASSESSMENT AND PLAN: 80 yo with history of CAD s/p CABG, PAD, and systolic CHF was admitted with anterior STEMI.  1. CAD: s/p CABG 1987. 4/17 admitted with VT arrest and cath showed all SVGs occluded. The LIMA-LAD had a 70-75% anastomotic lesion. He was medically managed.  This admission had anterior ST elevation and chest pain, troponin to 45. Similar anatomy with diffusely diseased RCA, totally occluded LCx and LAD, and 75% anastomotic lesion at LIMA touchdown.  Touchdown lesion was likely the culprit.  His last 2 admissions have likely been ischemia-related, VT arrest in 4/17 and anterior STEMI this time.  I am concerned that in the absence of any intervention, this pattern will continue and there is little we can do about it.  He is very tenuous.  Difficult vascular access so would not be candidate for hemodynamic support.  I have discussed situation with Dr [place], could potentially intervene on the LIMA-LAD anastomotic lesion.  Will see if we can arrange for today.  - Continue ASA 81 and Plavix.  - Continue atorvastatin.   - He is on Ranexa - Continue heparin gtt for now - Possible PCI today, will see if we can work it out.  2. Chronic systolic CHF: Ischemic cardiomyopathy. EF 20-25% on 4/17 echo. On exam, he does not appear volume overloaded.  - Continue Coreg 3.125 mg bid and spironolactone.  - Hold lisinopril and Lasix today with possible intervention, can restart tomorrow.   - Repeat echo ordered.  3. PAD: Leg weakness is a major complaint. 11/16 peripheral angiography per report showed bilateral SFA occlusions. Peripheral arterial dopplers in our  system in 5/17 also showed bilateral SFA occlusions. Difficult vascular access noted on cath over weekend, left femoral used.  Not candidate therefore for hemodynamic support with PCI.  4. VT: 4/17 admission, now has ICD and is on amiodarone.   5. Anemia: GI bleeding of uncertain etiology in 3/17. Hemoglobin now stable.  6. Aortic stenosis: Appeared moderate on last echo.  7. Mitral regurgitation: Moderate to severe, likely infarct-related.  8. AAA: 4.5 cm in 5/17.  9. AKI on CKD: Resolved, creatinine now down to 1.3.  Holding Lasix and very gentle hydration today pre-PCI.  Marca Ancona 01/20/2016 8:09 AM

## 2016-01-21 ENCOUNTER — Encounter (HOSPITAL_COMMUNITY): Payer: Self-pay

## 2016-01-21 DIAGNOSIS — I080 Rheumatic disorders of both mitral and aortic valves: Secondary | ICD-10-CM

## 2016-01-21 LAB — CBC
HCT: 31 % — ABNORMAL LOW (ref 39.0–52.0)
Hemoglobin: 9.8 g/dL — ABNORMAL LOW (ref 13.0–17.0)
MCH: 27.6 pg (ref 26.0–34.0)
MCHC: 31.6 g/dL (ref 30.0–36.0)
MCV: 87.3 fL (ref 78.0–100.0)
PLATELETS: 233 10*3/uL (ref 150–400)
RBC: 3.55 MIL/uL — ABNORMAL LOW (ref 4.22–5.81)
RDW: 15.2 % (ref 11.5–15.5)
WBC: 9 10*3/uL (ref 4.0–10.5)

## 2016-01-21 LAB — BASIC METABOLIC PANEL
Anion gap: 4 — ABNORMAL LOW (ref 5–15)
BUN: 24 mg/dL — AB (ref 6–20)
CHLORIDE: 106 mmol/L (ref 101–111)
CO2: 26 mmol/L (ref 22–32)
CREATININE: 1.59 mg/dL — AB (ref 0.61–1.24)
Calcium: 8.9 mg/dL (ref 8.9–10.3)
GFR calc Af Amer: 47 mL/min — ABNORMAL LOW (ref >60)
GFR, EST NON AFRICAN AMERICAN: 40 mL/min — AB (ref >60)
Glucose, Bld: 118 mg/dL — ABNORMAL HIGH (ref 65–99)
Potassium: 4.5 mmol/L (ref 3.5–5.1)
SODIUM: 136 mmol/L (ref 135–145)

## 2016-01-21 LAB — POCT ACTIVATED CLOTTING TIME: Activated Clotting Time: 234 seconds

## 2016-01-21 MED ORDER — FUROSEMIDE 40 MG PO TABS
40.0000 mg | ORAL_TABLET | Freq: Every day | ORAL | Status: DC
  Administered 2016-01-22: 40 mg via ORAL
  Filled 2016-01-21: qty 1

## 2016-01-21 NOTE — Progress Notes (Signed)
Patient ID: Andre Wilson, male   DOB: 1935/12/17, 80 y.o.   MRN: [id number]   SUBJECTIVE: No dyspnea or chest pain this morning.   5/30 echo: EF 25-30%, mild AS, moderate MR.   5/30 PCI to LIMA-LAD anastomotic lesion.  Scheduled Meds: . amiodarone  200 mg Oral Daily  . aspirin EC  81 mg Oral Daily  . atorvastatin  80 mg Oral Daily  . carvedilol  3.125 mg Oral BID WC  . clopidogrel  75 mg Oral Daily  . ezetimibe  10 mg Oral Daily  . feeding supplement (ENSURE ENLIVE)  237 mL Oral TID BM  . ferrous sulfate  325 mg Oral TID WC  . folic acid  1 mg Oral Daily  . [START ON 01/22/2016] furosemide  40 mg Oral Daily  . latanoprost  1 drop Both Eyes QHS  . pantoprazole  40 mg Oral Q1200  . ranolazine  500 mg Oral BID  . sodium chloride flush  3 mL Intravenous Q12H  . sodium chloride flush  3 mL Intravenous Q12H  . sodium chloride flush  3 mL Intravenous Q12H  . spironolactone  12.5 mg Oral q morning - 10a  . timolol  1 drop Both Eyes BID  . vitamin C  500 mg Oral BID   Continuous Infusions: . sodium chloride 10 mL/hr (01/20/16 1900)   PRN Meds:.sodium chloride, sodium chloride, sodium chloride, acetaminophen, linaclotide, magnesium hydroxide, morphine injection, nitroGLYCERIN, ondansetron (ZOFRAN) IV, oxyCODONE-acetaminophen, promethazine, sodium chloride flush, sodium chloride flush, sodium chloride flush, traZODone    Filed Vitals:   01/21/16 0400 01/21/16 0500 01/21/16 0600 01/21/16 0700  BP: 86/50 86/52 88/57  95/51  Pulse: 73 71 72 74  Temp:      TempSrc:      Resp: 14 14 8 8   Height:      Weight:      SpO2: 100% 100% 100% 100%    Intake/Output Summary (Last 24 hours) at 01/21/16 0753 Last data filed at 01/21/16 0700  Gross per 24 hour  Intake 514.92 ml  Output   1075 ml  Net -560.08 ml    LABS: Basic Metabolic Panel:  Recent Labs  40/98/1105/30/17 0306 01/21/16 0245  NA 135 136  K 4.5 4.5  CL 101 106  CO2 25 26  GLUCOSE 89 118*  BUN 30* 24*  CREATININE 1.35*  1.59*  CALCIUM 9.1 8.9   Liver Function Tests: No results for input(s): AST, ALT, ALKPHOS, BILITOT, PROT, ALBUMIN in the last 72 hours. No results for input(s): LIPASE, AMYLASE in the last 72 hours. CBC:  Recent Labs  01/20/16 0306 01/21/16 0239  WBC 9.6 9.0  HGB 10.2* 9.8*  HCT 31.8* 31.0*  MCV 86.6 87.3  PLT 230 233   Cardiac Enzymes:  Recent Labs  01/18/16 1225 01/18/16 1832  TROPONINI 25.97* 42.95*   BNP: Invalid input(s): POCBNP D-Dimer: No results for input(s): DDIMER in the last 72 hours. Hemoglobin A1C: No results for input(s): HGBA1C in the last 72 hours. Fasting Lipid Panel: No results for input(s): CHOL, HDL, LDLCALC, TRIG, CHOLHDL, LDLDIRECT in the last 72 hours. Thyroid Function Tests: No results for input(s): TSH, T4TOTAL, T3FREE, THYROIDAB in the last 72 hours.  Invalid input(s): FREET3 Anemia Panel: No results for input(s): VITAMINB12, FOLATE, FERRITIN, TIBC, IRON, RETICCTPCT in the last 72 hours.  RADIOLOGY: Dg Chest Port 1 View  01/18/2016  CLINICAL DATA:  Chest pain today. EXAM: PORTABLE CHEST 1 VIEW COMPARISON:  Frontal and lateral views 12/19/2015 FINDINGS: Single  lead pacemaker remains in place. Patient is post median sternotomy. The patient is rotated. Heart at the upper limits normal in size. The left lower lobe consolidation on prior exam is no longer seen. There is no pulmonary edema. Underlying chronic interstitial markings again seen. No pneumothorax. Previous pleural effusions are not seen, left costophrenic angle excluded. IMPRESSION: Resolution of previous left lung consolidation and pleural effusions. No new or acute abnormalities are seen. Electronically Signed   By: Rubye Oaks M.D.   On: 01/18/2016 05:57    PHYSICAL EXAM General: NAD Neck: JVP 7-8 cm, no thyromegaly or thyroid nodule.  Lungs: Clear to auscultation bilaterally with normal respiratory effort. CV: Lateral PMI.  Heart regular S1/S2, no S3/S4, 2/6 HSM apex and SEM  RUSB.  No peripheral edema.  No carotid bruit.  Absent pedal pulses.  Abdomen: Soft, nontender, no hepatosplenomegaly, no distention.  Neurologic: Alert and oriented x 3.  Psych: Normal affect. Extremities: No clubbing or cyanosis. Right groin cath site benign.   TELEMETRY: Reviewed telemetry pt in NSR  ASSESSMENT AND PLAN: 80 yo with history of CAD s/p CABG, PAD, and systolic CHF was admitted with anterior STEMI.  1. CAD: s/p CABG 1987. 4/17 admitted with VT arrest and cath showed all SVGs occluded. The LIMA-LAD had a 70-75% anastomotic lesion. He was medically managed.  This admission had anterior ST elevation and chest pain, troponin to 45. Similar anatomy with diffusely diseased RCA, totally occluded LCx and LAD, and 75% anastomotic lesion at LIMA touchdown.  Touchdown lesion was likely the culprit.  His last 2 admissions have likely been ischemia-related, VT arrest in 4/17 and anterior STEMI this time.  He had successful PCI to the LIMA-LAD anastomotic lesion on 5/30.  No complaints this morning.  - Continue ASA 81 and Plavix.  - Continue atorvastatin.   - He is on Ranexa - May transfer to telemetry.  Walk in halls today.  2. Chronic systolic CHF: Ischemic cardiomyopathy. EF 25-30% with moderate MR on echo this admission. On exam, he does not appear volume overloaded.  - Continue Coreg 3.125 mg bid and spironolactone.  - Hold lisinopril and Lasix today with creatinine up to 1.59.  Hopefully can resume tomorrow.     3. PAD: Leg weakness is a major complaint. 11/16 peripheral angiography per report showed bilateral SFA occlusions. Peripheral arterial dopplers in our system in 5/17 also showed bilateral SFA occlusions.  4. VT: 4/17 admission, now has ICD and is on amiodarone.   5. Anemia: GI bleeding of uncertain etiology in 3/17. Hemoglobin now stable.  6. Aortic stenosis: Mild on echo this admission.  7. Mitral regurgitation: Moderate on echo this admission.  8. AAA:  4.5 cm in 5/17.  9. AKI on CKD: Creatinine 1.59, will hold Lasix and lisinopril today, restart tomorrow.  10. Disposition: To telemetry, mobilize, home tomorrow.   Marca Ancona 01/21/2016 7:53 AM

## 2016-01-21 NOTE — Progress Notes (Signed)
CARDIAC REHAB PHASE I   PRE:  Rate/Rhythm: 80 SR  BP:  Sitting: 108/54       SaO2: 100 RA  MODE:  Ambulation: 350 ft   POST:  Rate/Rhythm: 93 SR  BP:  Sitting: 130/67         SaO2: 100 RA  Pt ambulated 350 ft on RA, rolling walker, gait belt, assist x1, slow, steady gait, tolerated well with no complaints, declined rest stop. Began MI/stent/CHF education with pt and wife at bedside.  Reviewed risk factors, MI book, anti-platelet therapy, stent card, activity restrictions, ntg, exercise, CHF booklet and zone tool, daily weights, and phase 2 cardiac rehab. Left heart healthy and low sodium diet handouts for pt to review. Pt verbalized understanding, receptive to education. Pt agrees to phase 2 cardiac rehab referral, [facility]. During education, pt sitting in recliner, began to c/o dizziness, VSS, pt had not had breakfast, drank orange juice and felt better. RN aware. Pt to recliner after walk, call bell within reach. Will follow-up tomorrow for diet education/ambulation.   [phone] Joylene GrapesEmily C Anevay Campanella, RN, BSN 01/21/2016 10:48 AM

## 2016-01-21 NOTE — Evaluation (Signed)
Physical Therapy Evaluation Patient Details Name: Andre Wilson MRN: [id number] DOB: Apr 01, 1936 Today's Date: 01/21/2016   History of Present Illness  80 y.o. male with PMH of HTN, GIB, CAD, MI, CABG '87, VTach arrest 12/15/15 with ICD placed 12/18/15 admitted with STEMI.   Clinical Impression  Pt ambulated 280' with RW without loss of balance, HR 103, no dyspnea. No physical assistance needed for bed mobility, transfers or ambulation. He is independent with mobility using the RW. From PT standpoint he is safe to DC home. Encouraged pt to ambulate in halls TID. PT signing off.     Follow Up Recommendations No PT follow up    Equipment Recommendations  None recommended by PT    Recommendations for Other Services       Precautions / Restrictions Precautions Precautions: None Restrictions Weight Bearing Restrictions: No      Mobility  Bed Mobility Overal bed mobility: Independent                Transfers Overall transfer level: Independent                  Ambulation/Gait Ambulation/Gait assistance: Modified independent (Device/Increase time) Ambulation Distance (Feet): 280 Feet Assistive device: Rolling walker (2 wheeled) Gait Pattern/deviations: Trunk flexed   Gait velocity interpretation: Below normal speed for age/gender General Gait Details: verbal cues for posture, no LOB, HR 103 with ambulation, no dyspnea  Stairs            Wheelchair Mobility    Modified Rankin (Stroke Patients Only)       Balance Overall balance assessment: Modified Independent                                           Pertinent Vitals/Pain Pain Assessment: No/denies pain    Home Living Family/patient expects to be discharged to:: Private residence Living Arrangements: Spouse/significant other;Children Available Help at Discharge: Family;Available 24 hours/day Type of Home: House Home Access: Level entry     Home Layout: One level Home  Equipment: Bedside commode;Grab bars - tub/shower;Shower seat - built in;Walker - 2 wheels;Cane - single point Additional Comments: Pt from out ot town. Lives in [place]; staying with daughter in [place].    Prior Function Level of Independence: Independent         Comments: hasn't driven since coming to [facility] a couple months ago     Hand Dominance        Extremity/Trunk Assessment   Upper Extremity Assessment: Overall WFL for tasks assessed           Lower Extremity Assessment: Overall WFL for tasks assessed      Cervical / Trunk Assessment: Kyphotic  Communication   Communication: No difficulties  Cognition Arousal/Alertness: Awake/alert Behavior During Therapy: WFL for tasks assessed/performed Overall Cognitive Status: Within Functional Limits for tasks assessed                      General Comments      Exercises        Assessment/Plan    PT Assessment Patent does not need any further PT services  PT Diagnosis     PT Problem List    PT Treatment Interventions     PT Goals (Current goals can be found in the Care Plan section) Acute Rehab PT Goals Patient Stated Goal: to play golf PT Goal Formulation:  All assessment and education complete, DC therapy    Frequency     Barriers to discharge        Co-evaluation               End of Session Equipment Utilized During Treatment: Gait belt Activity Tolerance: Patient tolerated treatment well;No increased pain Patient left: in chair;with call bell/phone within reach;with family/visitor present Nurse Communication: Mobility status         Time: 1319-1338 PT Time [id number] (min) (ACUTE ONLY): 19 min   Charges:   PT Evaluation $PT Eval Low Complexity: 1 Procedure     PT G Codes:        Tamala SerUhlenberg, Anaira Seay Kistler 01/21/2016, 1:41 PM [phone]

## 2016-01-21 NOTE — Progress Notes (Signed)
[facility]  Patient Status: Active (receiving services up to time of hospitalization)  [facility] is providing the following services: RN  If patient discharges after hours, please call [phone].   Andre FurnishDonna Wilson 01/21/2016, 9:47 AM

## 2016-01-22 LAB — BASIC METABOLIC PANEL
Anion gap: 8 (ref 5–15)
BUN: 24 mg/dL — ABNORMAL HIGH (ref 6–20)
CHLORIDE: 104 mmol/L (ref 101–111)
CO2: 23 mmol/L (ref 22–32)
Calcium: 9.1 mg/dL (ref 8.9–10.3)
Creatinine, Ser: 1.42 mg/dL — ABNORMAL HIGH (ref 0.61–1.24)
GFR calc non Af Amer: 46 mL/min — ABNORMAL LOW (ref >60)
GFR, EST AFRICAN AMERICAN: 53 mL/min — AB (ref >60)
Glucose, Bld: 95 mg/dL (ref 65–99)
POTASSIUM: 4.7 mmol/L (ref 3.5–5.1)
SODIUM: 135 mmol/L (ref 135–145)

## 2016-01-22 LAB — CBC
HEMATOCRIT: 29.5 % — AB (ref 39.0–52.0)
HEMOGLOBIN: 9.4 g/dL — AB (ref 13.0–17.0)
MCH: 27.6 pg (ref 26.0–34.0)
MCHC: 31.9 g/dL (ref 30.0–36.0)
MCV: 86.8 fL (ref 78.0–100.0)
Platelets: 219 10*3/uL (ref 150–400)
RBC: 3.4 MIL/uL — AB (ref 4.22–5.81)
RDW: 15.3 % (ref 11.5–15.5)
WBC: 9.6 10*3/uL (ref 4.0–10.5)

## 2016-01-22 MED ORDER — FUROSEMIDE 40 MG PO TABS: ORAL_TABLET | ORAL | Status: DC

## 2016-01-22 MED ORDER — LISINOPRIL 2.5 MG PO TABS
2.5000 mg | ORAL_TABLET | Freq: Every day | ORAL | Status: DC
Start: 2016-01-22 — End: 2016-01-22

## 2016-01-22 NOTE — Discharge Summary (Signed)
[facility] Discharge Note   Discharge Summary   Patient ID: Andre Wilson MRN: [id number], DOB/AGE: [birth year] 78 y.o. Admit date: 01/18/2016 D/C date:     01/22/2016   Primary Discharge Diagnoses:  1. CAD: s/p CABG 1987 s/p successful PCI to the LIMA-LAD anastomotic lesion on 5/30.   2. Chronic systolic CHF: Ischemic cardiomyopathy. EF 25-30% with moderate MR on echo this admission.  3. PAD: Leg weakness is a major complaint. 11/16 peripheral angiography per report showed bilateral SFA occlusions. Peripheral arterial dopplers in our system in 5/17 also showed bilateral SFA occlusions.  4. VT: 4/17 admission, now has ICD and is on amiodarone.   5. Anemia: GI bleeding of uncertain etiology in 3/17. Hemoglobin stable.  6. Aortic stenosis: Mild on echo this admission.  7. Mitral regurgitation: Moderate on echo this admission.  8. AAA: 4.5 cm in 5/17.  9. AKI on CKD: Creatinine down to 1.42 prior to d/c.    Hospital Course:   80 yo with history of CAD s/p CABG (all SVGs down), ischemic cardiomyopathy, and GI bleeding history who [facility]  [facility] after waking up with acute chest pain and diaphoresis. Upon arrival, ECG showed NSR with non-specific IVCD and QTC 503 with ST elevations V3, V4 and other non specific ST-T wave changes. 5000 U of heparin given in ED and  taken emergently for cath with his history.  LHC showed anastomic LAD/LIMA lesion.  ECG repeated on cath table with improvement in ST segments, and patient pain improved. Given changes and high risks associated with intervening on this lesion, decision made to medically manage.Peak troponin 42.95  Pt had no chest pain after cath but was relatively inactive. It was felt his last 2 admission had been related to ischemia, and concerns raised that without intervention, we would continue with these types of events.  It was decided to attempt and intervene the LIMA-LAD lesion, pt agreed with  benefits outweighing risk. Patient underwent PCI to LIMA-LAD on 01/20/16 with 0% residual stenosis.   5/30 echo: EF 25-30%, mild AS, moderate MR.   Hospital course lengthened by creatinine bump, so lasix and lisinopril held with improvement.  They were both resumed prior to d/c.   Volume status stable throughout admission, lasix actually held for several days with hypotension and procedures.   Patient had no further chest pain and successful intervention as above.  He will be discharged to home in stable condition with close follow up as below.   Discharge Weight Range: 140 lbs Discharge Vitals: Blood pressure 98/45, pulse 80, temperature 98.1 F (36.7 C), temperature source Oral, resp. rate 16, height 5' 9.5" (1.765 m), weight 140 lb 14.4 oz (63.912 kg), SpO2 100 %.  Labs: Lab Results  Component Value Date   WBC 9.6 01/22/2016   HGB 9.4* 01/22/2016   HCT 29.5* 01/22/2016   MCV 86.8 01/22/2016   PLT 219 01/22/2016    Recent Labs Lab 01/18/16 0456  01/22/16 0400  NA 135  < > 135  K 5.3*  < > 4.7  CL 101  < > 104  CO2 25  < > 23  BUN 30*  < > 24*  CREATININE 1.99*  < > 1.42*  CALCIUM 9.6  < > 9.1  PROT 7.5  --   --   BILITOT 0.7  --   --   ALKPHOS 51  --   --   ALT 30  --   --   AST 27  --   --  GLUCOSE 128*  < > 95  < > = values in this interval not displayed. Lab Results  Component Value Date   CHOL 135 01/18/2016   HDL 33* 01/18/2016   LDLCALC 90 01/18/2016   TRIG 58 01/18/2016   BNP (last 3 results)  Recent Labs  12/13/15 1553 12/26/15 1548 01/12/16 1515  BNP 1431.1* 644.5* 370.2*    ProBNP (last 3 results) No results for input(s): PROBNP in the last 8760 hours.   Diagnostic Studies/Procedures   No results found.  Discharge Medications     Medication List    TAKE these medications        amiodarone 200 MG tablet  Commonly known as:  PACERONE  Take 1 tablet (200 mg total) by mouth daily.     aspirin 81 MG EC tablet  Take 1 tablet (81 mg  total) by mouth daily.     atorvastatin 80 MG tablet  Commonly known as:  LIPITOR  Take 1 tablet (80 mg total) by mouth daily.     carvedilol 3.125 MG tablet  Commonly known as:  COREG  Take 1 tablet (3.125 mg total) by mouth 2 (two) times daily with a meal.     clopidogrel 75 MG tablet  Commonly known as:  PLAVIX  Take 75 mg by mouth daily.     ezetimibe 10 MG tablet  Commonly known as:  ZETIA  Take 1 tablet (10 mg total) by mouth daily.     ferrous sulfate 325 (65 FE) MG EC tablet  Take 325 mg by mouth 3 (three) times daily with meals.     folic acid 1 MG tablet  Commonly known as:  FOLVITE  Take 1 mg by mouth daily.     furosemide 40 MG tablet  Commonly known as:  LASIX  Take 2 tablets (40 mg total) every morning and 1 tablet (20 mg total) every evening)     latanoprost 0.005 % ophthalmic solution  Commonly known as:  XALATAN  Place 1 drop into both eyes at bedtime.     linaclotide 145 MCG Caps capsule  Commonly known as:  LINZESS  Take 145 mcg by mouth daily as needed (for constipation). Reported on 01/01/2016     lisinopril 2.5 MG tablet  Commonly known as:  PRINIVIL,ZESTRIL  Take 1 tablet (2.5 mg total) by mouth every evening.     nitroGLYCERIN 0.4 MG SL tablet  Commonly known as:  NITROSTAT  Place 0.4 mg under the tongue every 5 (five) minutes as needed for chest pain.     oxyCODONE-acetaminophen 5-325 MG tablet  Commonly known as:  PERCOCET/ROXICET  Take 1 tablet by mouth every 6 (six) hours as needed for severe pain.     promethazine 12.5 MG tablet  Commonly known as:  PHENERGAN  Take 1 tablet (12.5 mg total) by mouth every 6 (six) hours as needed for nausea or vomiting.     ranolazine 500 MG 12 hr tablet  Commonly known as:  RANEXA  Take 1 tablet (500 mg total) by mouth 2 (two) times daily.     spironolactone 25 MG tablet  Commonly known as:  ALDACTONE  Take 0.5 tablets (12.5 mg total) by mouth every morning.     timolol 0.5 % ophthalmic solution    Commonly known as:  TIMOPTIC  Place 1 drop into both eyes 2 (two) times daily.     traZODone 50 MG tablet  Commonly known as:  DESYREL  Take 1 tablet (50 mg  total) by mouth at bedtime as needed for sleep.     vitamin C 500 MG tablet  Commonly known as:  ASCORBIC ACID  Take 500 mg by mouth 2 (two) times daily.        Disposition   The patient will be discharged in stable condition to home. Discharge Instructions    AMB Referral to Cardiac Rehabilitation - Phase II    Complete by:  As directed   Diagnosis:  STEMI     Amb Referral to Cardiac Rehabilitation    Complete by:  As directed   Diagnosis:   Coronary Stents STEMI       Diet - low sodium heart healthy    Complete by:  As directed      Heart Failure patients record your daily weight using the same scale at the same time of day    Complete by:  As directed      Increase activity slowly    Complete by:  As directed           Follow-up Information    Follow up with [facility] On 01/27/2016.   Specialty:  Cardiology   Why:  at 220 for post hospital follow up. Please bring all of your medications to your visit. The code for parking is 0020.   Contact information:   [address] [id number] mc [place] [address] [phone]        Duration of Discharge Encounter: Greater than 35 minutes   Signed, Graciella Freer PA-C 01/22/2016, 1:51 PM

## 2016-01-22 NOTE — Progress Notes (Signed)
Patient ID: Andre Wilson, male   DOB: 03-18-1936, 80 y.o.   MRN: [id number]    SUBJECTIVE: No dyspnea or chest pain this morning. Creatinine down to 1.4. Walked yesterday, no problems.   5/30 echo: EF 25-30%, mild AS, moderate MR.   5/30 PCI to LIMA-LAD anastomotic lesion.  Scheduled Meds: . amiodarone  200 mg Oral Daily  . aspirin EC  81 mg Oral Daily  . atorvastatin  80 mg Oral Daily  . carvedilol  3.125 mg Oral BID WC  . clopidogrel  75 mg Oral Daily  . ezetimibe  10 mg Oral Daily  . feeding supplement (ENSURE ENLIVE)  237 mL Oral TID BM  . ferrous sulfate  325 mg Oral TID WC  . folic acid  1 mg Oral Daily  . furosemide  40 mg Oral Daily  . latanoprost  1 drop Both Eyes QHS  . lisinopril  2.5 mg Oral QHS  . pantoprazole  40 mg Oral Q1200  . ranolazine  500 mg Oral BID  . sodium chloride flush  3 mL Intravenous Q12H  . sodium chloride flush  3 mL Intravenous Q12H  . sodium chloride flush  3 mL Intravenous Q12H  . spironolactone  12.5 mg Oral q morning - 10a  . timolol  1 drop Both Eyes BID  . vitamin C  500 mg Oral BID   Continuous Infusions: . sodium chloride Stopped (01/21/16 1000)   PRN Meds:.sodium chloride, sodium chloride, sodium chloride, acetaminophen, linaclotide, magnesium hydroxide, morphine injection, nitroGLYCERIN, ondansetron (ZOFRAN) IV, oxyCODONE-acetaminophen, promethazine, sodium chloride flush, sodium chloride flush, sodium chloride flush, traZODone    Filed Vitals:   01/21/16 1030 01/21/16 1305 01/21/16 2109 01/22/16 0510  BP: 104/65 114/56 112/66 95/55  Pulse:  84 82 80  Temp:  97.6 F (36.4 C) 98.2 F (36.8 C) 98.1 F (36.7 C)  TempSrc:  Oral Oral Oral  Resp:  22 19 16   Height:      Weight:    140 lb 14.4 oz (63.912 kg)  SpO2:  100% 100% 100%    Intake/Output Summary (Last 24 hours) at 01/22/16 0806 Last data filed at 01/22/16 0510  Gross per 24 hour  Intake    260 ml  Output    553 ml  Net   -293 ml    LABS: Basic Metabolic  Panel:  Recent Labs  01/21/16 0245 01/22/16 0400  NA 136 135  K 4.5 4.7  CL 106 104  CO2 26 23  GLUCOSE 118* 95  BUN 24* 24*  CREATININE 1.59* 1.42*  CALCIUM 8.9 9.1   Liver Function Tests: No results for input(s): AST, ALT, ALKPHOS, BILITOT, PROT, ALBUMIN in the last 72 hours. No results for input(s): LIPASE, AMYLASE in the last 72 hours. CBC:  Recent Labs  01/21/16 0239 01/22/16 0400  WBC 9.0 9.6  HGB 9.8* 9.4*  HCT 31.0* 29.5*  MCV 87.3 86.8  PLT 233 219   Cardiac Enzymes: No results for input(s): CKTOTAL, CKMB, CKMBINDEX, TROPONINI in the last 72 hours. BNP: Invalid input(s): POCBNP D-Dimer: No results for input(s): DDIMER in the last 72 hours. Hemoglobin A1C: No results for input(s): HGBA1C in the last 72 hours. Fasting Lipid Panel: No results for input(s): CHOL, HDL, LDLCALC, TRIG, CHOLHDL, LDLDIRECT in the last 72 hours. Thyroid Function Tests: No results for input(s): TSH, T4TOTAL, T3FREE, THYROIDAB in the last 72 hours.  Invalid input(s): FREET3 Anemia Panel: No results for input(s): VITAMINB12, FOLATE, FERRITIN, TIBC, IRON, RETICCTPCT in the last  72 hours.  RADIOLOGY: Dg Chest Port 1 View  01/18/2016  CLINICAL DATA:  Chest pain today. EXAM: PORTABLE CHEST 1 VIEW COMPARISON:  Frontal and lateral views 12/19/2015 FINDINGS: Single lead pacemaker remains in place. Patient is post median sternotomy. The patient is rotated. Heart at the upper limits normal in size. The left lower lobe consolidation on prior exam is no longer seen. There is no pulmonary edema. Underlying chronic interstitial markings again seen. No pneumothorax. Previous pleural effusions are not seen, left costophrenic angle excluded. IMPRESSION: Resolution of previous left lung consolidation and pleural effusions. No new or acute abnormalities are seen. Electronically Signed   By: Rubye Oaks M.D.   On: 01/18/2016 05:57    PHYSICAL EXAM General: NAD Neck: JVP 7-8 cm, no thyromegaly or  thyroid nodule.  Lungs: Clear to auscultation bilaterally with normal respiratory effort. CV: Lateral PMI.  Heart regular S1/S2, no S3/S4, 2/6 HSM apex and SEM RUSB.  No peripheral edema.  No carotid bruit.  Absent pedal pulses.  Abdomen: Soft, nontender, no hepatosplenomegaly, no distention.  Neurologic: Alert and oriented x 3.  Psych: Normal affect. Extremities: No clubbing or cyanosis. Right groin cath site benign.   TELEMETRY: Reviewed telemetry pt in NSR  ASSESSMENT AND PLAN: 80 yo with history of CAD s/p CABG, PAD, and systolic CHF was admitted with anterior STEMI.  1. CAD: s/p CABG 1987. 4/17 admitted with VT arrest and cath showed all SVGs occluded. The LIMA-LAD had a 70-75% anastomotic lesion. He was medically managed.  This admission had anterior ST elevation and chest pain, troponin to 45. Similar anatomy with diffusely diseased RCA, totally occluded LCx and LAD, and 75% anastomotic lesion at LIMA touchdown.  Touchdown lesion was likely the culprit.  His last 2 admissions have likely been ischemia-related, VT arrest in 4/17 and anterior STEMI this time.  He had successful PCI to the LIMA-LAD anastomotic lesion on 5/30.  No complaints this morning.  - Continue ASA 81 and Plavix.  - Continue atorvastatin.   - He is on Ranexa 2. Chronic systolic CHF: Ischemic cardiomyopathy. EF 25-30% with moderate MR on echo this admission. On exam, he does not appear volume overloaded.  - Continue Coreg 3.125 mg bid and spironolactone.  - Restart lisinopril 2.5 qpm today.  - Will go home on Lasix 40 qam/20 qpm.      3. PAD: Leg weakness is a major complaint. 11/16 peripheral angiography per report showed bilateral SFA occlusions. Peripheral arterial dopplers in our system in 5/17 also showed bilateral SFA occlusions.  4. VT: 4/17 admission, now has ICD and is on amiodarone.   5. Anemia: GI bleeding of uncertain etiology in 3/17. Hemoglobin now stable.  6. Aortic stenosis: Mild on  echo this admission.  7. Mitral regurgitation: Moderate on echo this admission.  8. AAA: 4.5 cm in 5/17.  9. AKI on CKD: Creatinine down to 1.42.  10. Disposition: Home today.  Needs followup with me in 10-14 days.  Home meds: amiodarone 200 daily, ASA 81 daily, Plavix 75 daily, lisinopril 2.5 daily (take in the evening), Coreg 3.125 bid, spironolactone 12.5 daily (take in morning), Lasix 40 qam/20 qpm, atorvastatin 80 daily, Zetia 10 daily, ranolazine 500 mg bid.    Marca Ancona 01/22/2016 8:06 AM

## 2016-01-22 NOTE — Progress Notes (Signed)
Discharge instructions discussed with pt. Rx given for Lasix. Post cath instructions and s/s to report to MD. Verbalized understanding. Rt and Left groin drsgs removed and bandaid applied. Both sites benign. No questions or concerns expressed. Will discharge to home when pt's daughter arrives.

## 2016-01-22 NOTE — Progress Notes (Signed)
CARDIAC REHAB PHASE I   PRE:  Rate/Rhythm: *73 SR  BP:  Sitting: 98/51        SaO2: 100 RA  MODE:  Ambulation: 350 ft   POST:  Rate/Rhythm: 94 SR  BP:  Sitting: 112/59         SaO2: 100 RA  Pt is upset that he did not receive a breakfast tray this morning and states he did not sleep well last night, ultimately agreeable to walk. Pt ambulated 350 ft on RA, rolling walker, independent, steady gait, tolerated well with no complaints. Reviewed heart healthy diet, sodium restrictions. Pt verbalized understanding, states he has no questions regarding yesterday's education at this time.  Pt to recliner after walk, call bell within reach, eager for discharge.   [phone] Joylene GrapesEmily C Antwain Caliendo, RN, BSN 01/22/2016 11:00 AM

## 2016-01-26 ENCOUNTER — Telehealth (HOSPITAL_COMMUNITY): Payer: Self-pay

## 2016-01-26 NOTE — Telephone Encounter (Signed)
VO given to resume HH orders. 1 RN visit once weekly x 3 weeks for CHF education. Andre Wilson states patient doing very well and almost at baseline and feels confidently that 3 weeks should be ample time to get patient independent in self care with CHF.  Ave FilterBradley, Buddy Loeffelholz [employer]

## 2016-01-27 ENCOUNTER — Ambulatory Visit (HOSPITAL_COMMUNITY)
Admission: RE | Admit: 2016-01-27 | Discharge: 2016-01-27 | Disposition: A | Discharge status: Home / Self Care | Payer: Medicare Other | Source: Ambulatory Visit | Attending: Cardiology | Admitting: Cardiology

## 2016-01-27 ENCOUNTER — Telehealth (HOSPITAL_COMMUNITY): Payer: Self-pay | Admitting: *Deleted

## 2016-01-27 VITALS — BP 88/56 | HR 79 | Wt 143.4 lb

## 2016-01-27 DIAGNOSIS — I080 Rheumatic disorders of both mitral and aortic valves: Secondary | ICD-10-CM | POA: Insufficient documentation

## 2016-01-27 DIAGNOSIS — I5022 Chronic systolic (congestive) heart failure: Secondary | ICD-10-CM | POA: Diagnosis not present

## 2016-01-27 DIAGNOSIS — I714 Abdominal aortic aneurysm, without rupture: Secondary | ICD-10-CM | POA: Diagnosis not present

## 2016-01-27 DIAGNOSIS — I25810 Atherosclerosis of coronary artery bypass graft(s) without angina pectoris: Secondary | ICD-10-CM | POA: Diagnosis not present

## 2016-01-27 DIAGNOSIS — E785 Hyperlipidemia, unspecified: Secondary | ICD-10-CM | POA: Diagnosis not present

## 2016-01-27 DIAGNOSIS — R5383 Other fatigue: Secondary | ICD-10-CM | POA: Diagnosis not present

## 2016-01-27 DIAGNOSIS — Z79899 Other long term (current) drug therapy: Secondary | ICD-10-CM | POA: Insufficient documentation

## 2016-01-27 DIAGNOSIS — Z95810 Presence of automatic (implantable) cardiac defibrillator: Secondary | ICD-10-CM | POA: Insufficient documentation

## 2016-01-27 DIAGNOSIS — I255 Ischemic cardiomyopathy: Secondary | ICD-10-CM | POA: Diagnosis not present

## 2016-01-27 DIAGNOSIS — I739 Peripheral vascular disease, unspecified: Secondary | ICD-10-CM | POA: Diagnosis not present

## 2016-01-27 DIAGNOSIS — Z8249 Family history of ischemic heart disease and other diseases of the circulatory system: Secondary | ICD-10-CM | POA: Insufficient documentation

## 2016-01-27 DIAGNOSIS — R42 Dizziness and giddiness: Secondary | ICD-10-CM | POA: Diagnosis not present

## 2016-01-27 DIAGNOSIS — Z951 Presence of aortocoronary bypass graft: Secondary | ICD-10-CM | POA: Insufficient documentation

## 2016-01-27 DIAGNOSIS — I472 Ventricular tachycardia, unspecified: Secondary | ICD-10-CM

## 2016-01-27 DIAGNOSIS — Z7902 Long term (current) use of antithrombotics/antiplatelets: Secondary | ICD-10-CM | POA: Insufficient documentation

## 2016-01-27 DIAGNOSIS — Z7982 Long term (current) use of aspirin: Secondary | ICD-10-CM | POA: Insufficient documentation

## 2016-01-27 LAB — CBC
HCT: 33.6 % — ABNORMAL LOW (ref 39.0–52.0)
Hemoglobin: 10.9 g/dL — ABNORMAL LOW (ref 13.0–17.0)
MCH: 28.2 pg (ref 26.0–34.0)
MCHC: 32.4 g/dL (ref 30.0–36.0)
MCV: 86.8 fL (ref 78.0–100.0)
PLATELETS: 320 10*3/uL (ref 150–400)
RBC: 3.87 MIL/uL — ABNORMAL LOW (ref 4.22–5.81)
RDW: 15.4 % (ref 11.5–15.5)
WBC: 10.7 10*3/uL — AB (ref 4.0–10.5)

## 2016-01-27 LAB — BASIC METABOLIC PANEL
Anion gap: 9 (ref 5–15)
BUN: 22 mg/dL — ABNORMAL HIGH (ref 6–20)
CALCIUM: 9.7 mg/dL (ref 8.9–10.3)
CO2: 27 mmol/L (ref 22–32)
CREATININE: 1.68 mg/dL — AB (ref 0.61–1.24)
Chloride: 99 mmol/L — ABNORMAL LOW (ref 101–111)
GFR calc non Af Amer: 38 mL/min — ABNORMAL LOW (ref >60)
GFR, EST AFRICAN AMERICAN: 44 mL/min — AB (ref >60)
Glucose, Bld: 128 mg/dL — ABNORMAL HIGH (ref 65–99)
Potassium: 5.2 mmol/L — ABNORMAL HIGH (ref 3.5–5.1)
SODIUM: 135 mmol/L (ref 135–145)

## 2016-01-27 LAB — BRAIN NATRIURETIC PEPTIDE: B NATRIURETIC PEPTIDE 5: 688.1 pg/mL — AB (ref 0.0–100.0)

## 2016-01-27 MED ORDER — FUROSEMIDE 40 MG PO TABS: 40.0000 mg | ORAL_TABLET | Freq: Every day | ORAL | Status: DC

## 2016-01-27 NOTE — Patient Instructions (Signed)
HOLD Lasix for two day RESTART Lasix at 40 mg daily STOP Carvedilol  Your physician recommends that you schedule a follow-up appointment in:   Do the following things EVERYDAY: 1) Weigh yourself in the morning before breakfast. Write it down and keep it in a log. 2) Take your medicines as prescribed 3) Eat low salt foods-Limit salt (sodium) to 2000 mg per day.  4) Stay as active as you can everyday 5) Limit all fluids for the day to less than 2 liters 6)

## 2016-01-27 NOTE — Telephone Encounter (Signed)
Notes Recorded by Modesta MessingJasmine Q Erasto Sleight, CMA on 01/27/2016 at 4:44 PM Spoke with Toniann FailWendy she is aware of lab results and medication changes. [facility] and fax results to our office. Notes Recorded by Sherald HessAmy D Clegg, NP on 01/27/2016 at 4:32 PM K 5.2. Please call and ask him to stop spiro. Repeat BMEt next week. Lasix was already cut back today.

## 2016-01-27 NOTE — Progress Notes (Addendum)
Advanced Heart Failure Medication Review by a Pharmacist  Does the patient  feel that his/her medications are working for him/her?  yes  Has the patient been experiencing any side effects to the medications prescribed?  Patient and daughter state that patient has been experiencing increased dizziness and also ear pain that started this past Saturday (01/24/16).   Does the patient measure his/her own blood pressure or blood glucose at home?  Yes, daily   Does the patient have any problems obtaining medications due to transportation or finances?   no  Understanding of regimen: fair Understanding of indications: fair Potential of compliance: fair Patient understands to avoid NSAIDs. Patient understands to avoid decongestants.  Pharmacist comments: Mr. Andre Wilson is a pleasant 89 yom presenting to clinic with his daughter. Medication list was updated using a recent medication discharge summary. Patient and daughter state that patient's blood pressure has been much lower recently and that patient has been experiencing increased fatigue and some ear pain (people sound muffled). No current medications thought to be related to this complaint, and no current IV lasix therpy. Patient has been on PO lasix chronically for some time and therefore not thought to be contributing to this either. Patient also complaining of recent scratchy throat (confirmed that this does not involve any tongue or throat swelling since on lisinopril). Patient also complaining that he is having difficulty falling asleep at night despite trazodone 50mg  at bedtime (and daughter recently added on a melatonin at bedtime with no improvement). Recommended to continue melatonin for a few weeks (may take this long before seeing maximum effects).   Patient was recently discharged from hospital and all medications have been reviewed.   Time with patient: 10 min  Preparation and documentation time: 10 min  Total time: 20 min

## 2016-01-27 NOTE — Progress Notes (Signed)
Patient ID: Andre Wilson, male   DOB: October 20, 1935, 80 y.o.   MRN: [id number] PCP: Dr. Arthor Captain Cardiology: Dr. Shirlee Latch  80 yo with history of CAD s/p CABG, ischemic cardiomyopathy, and recent GI bleed presents for cardiology followup after hospitalization.  Patient has been followed in the past in [place].  He had CABG back in 1987.  He had an admission in [place] in 3/17 with GI bleeding.  Per patient and daughter, bleeding was severe but he did not have endoscopy.  He is back on ASA 81 and Plavix.    He was admitted to [facility] in 4/17 with acute on chronic systolic CHF.  After hospitalization, he had a VT arrest.  Echo showed EF 25-30% with moderately decreased RV function, probably moderate AS, and mod-severe MR.  LHC was done, showing 3/4 occluded bypass grafts.  LIMA-LAD was patent but had 70-75% anastomotic stenosis.  He was medically managed. RHC showed near-normal filling pressures.  St Jude ICD was placed.   Admitted May 28th with chest pain. Had LHC with PCI to LIMA-LAD on May 30th. Discharge weight was 140 pounds.   He returns for HF follow up. Complains of fatigue. Mild dyspnea with exertion. No chest pain. Dizzy when standing.  Appetite fair. Ambulates with a walker.  Weight at home has gone down from 142-139 pounds. No chest pain.  [facility] RN following. Lives with his daughter and she prepares his medications.   Labs (4/17): K 4.5, creatinine 1.42, LDL 120, HCT 31.7 Labs (5/17): K 4.3, creatinine 1.49, BNP 645, TSH normal, HCT 38.8, LFTs normal  PMH: 1. CAD: S/p CABG 1987.  - LHC (4/17) with total occlusion of vein grafts x 3.  LIMA-LAD patent with 70-75% anastomotic stenosis.  Total occlusion of the LAD, LCx, and diffuse disease throughout the RCA.  He was managed medically.  2. Chronic systolic CHF: Ischemic cardiomyopathy.  St Jude ICD.  - Echo (4/17): EF 25-30%, inferolateral akinesis, moderately decreased RV systolic function, ?moderate aortic stenosis but mean gradient  10 mmHg, moderate to severe MR.  - RHC (4/17): mean RA 2, PA 42/14, mean PCWP 15, CI 2.09. 3. History of GI bleeding: 3/17.  Per report, bleeding was severe, but he did not have endoscopy done.   4. PAD: 11/16 cath showed occluded bilateral SFAs.   - Lower extremity dopplers (5/17) with bilateral SFA occlusions, > 50% left external iliac stenosis.  5. VT: 4/17 admit with VT, loss of consciousness.  6. Hyperlipidemia 7. Aortic stenosis: Probably moderate on 4/17 echo.  8. Mitral regurgitation: Moderate to severe on 4/17 echo, suspect infarct-related.  9. AAA: Abdominal US 5/17 with 4.5 cm AAA.   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  Nonsmoker (since college years), no ETOH.   FH: CAD  ROS: All systems reviewed and negative except as per HPI.   Current Outpatient Prescriptions  Medication Sig Dispense Refill  . amiodarone (PACERONE) 200 MG tablet Take 1 tablet (200 mg total) by mouth daily. 30 tablet 3  . aspirin EC 81 MG EC tablet Take 1 tablet (81 mg total) by mouth daily. 30 tablet 3  . atorvastatin (LIPITOR) 80 MG tablet Take 1 tablet (80 mg total) by mouth daily. 30 tablet 6  . carvedilol (COREG) 3.125 MG tablet Take 1 tablet (3.125 mg total) by mouth 2 (two) times daily with a meal. 60 tablet 3  . clopidogrel (PLAVIX) 75 MG tablet Take 75 mg by mouth daily.    Marland Kitchen  ezetimibe (ZETIA) 10 MG tablet Take 1 tablet (10 mg total) by mouth daily. 30 tablet 1  . ferrous sulfate 325 (65 FE) MG EC tablet Take 325 mg by mouth 3 (three) times daily with meals.    . folic acid (FOLVITE) 1 MG tablet Take 1 mg by mouth daily.    . furosemide (LASIX) 40 MG tablet Take 20-40 mg by mouth 2 (two) times daily. Take 1 tablet (40 mg total) every morning and 0.5 tablet (20 mg total) every evening    . latanoprost (XALATAN) 0.005 % ophthalmic solution Place 1 drop into both eyes at bedtime.  0  . linaclotide (LINZESS) 145 MCG CAPS capsule Take 145 mcg by mouth daily as  needed (for constipation). Reported on 01/01/2016    . lisinopril (PRINIVIL,ZESTRIL) 2.5 MG tablet Take 1 tablet (2.5 mg total) by mouth every evening. 30 tablet 3  . nitroGLYCERIN (NITROSTAT) 0.4 MG SL tablet Place 0.4 mg under the tongue every 5 (five) minutes as needed for chest pain.    Marland Kitchen. oxyCODONE-acetaminophen (PERCOCET/ROXICET) 5-325 MG tablet Take 1 tablet by mouth every 6 (six) hours as needed for severe pain. 10 tablet 0  . promethazine (PHENERGAN) 12.5 MG tablet Take 1 tablet (12.5 mg total) by mouth every 6 (six) hours as needed for nausea or vomiting. 30 tablet 0  . ranolazine (RANEXA) 500 MG 12 hr tablet Take 1 tablet (500 mg total) by mouth 2 (two) times daily. 60 tablet 1  . spironolactone (ALDACTONE) 25 MG tablet Take 0.5 tablets (12.5 mg total) by mouth every morning. 15 tablet 3  . timolol (TIMOPTIC) 0.5 % ophthalmic solution Place 1 drop into both eyes 2 (two) times daily.  0  . traZODone (DESYREL) 50 MG tablet Take 1 tablet (50 mg total) by mouth at bedtime as needed for sleep. 90 tablet 3  . vitamin C (ASCORBIC ACID) 500 MG tablet Take 500 mg by mouth 2 (two) times daily.     No current facility-administered medications for this encounter.   BP 88/56 mmHg  Pulse 79  Wt 143 lb 6.4 oz (65.046 kg)  SpO2 100%  SBP Sitting 100 SBP Standing 110  General: NAD, thin Daughter present. Arrive in a wheel chair.  Neck: No JVD, no thyromegaly or thyroid nodule.  Lungs: Clear to auscultation bilaterally with normal respiratory effort. CV: Nondisplaced PMI.  Heart regular S1/S2, no S3/S4, 2/6 SEM RUSB with clear S2.  No peripheral edema.  No carotid bruit.  Unable to palpate pedal pulses.  Abdomen: Soft, nontender, no hepatosplenomegaly, no distention.  Skin: Intact without lesions or rashes.  Neurologic: Alert and oriented x 3.  Psych: Normal affect. Extremities: No clubbing or cyanosis.  HEENT: Normal.   Assessment/Plan: 1. CAD: s/p CABG 1987.  4/17 cath showed all SVGs  occluded.  The LIMA-LAD had a 70-75% anastomotic lesion.  Had PCI 5/30 to LIMA-LAD.    - Continue ASA 81 and Plavix.  - On high dose Atorvastatin  - He is on Ranexa, likely for chest pain.  2. Chronic systolic CHF: Ischemic cardiomyopathy.  EF 20-25% on 4/17 echo. Has ST Jude ICD.    NYHA III. Volume status low and verified with Corvue. Hold lasix for 2 days. Not orthostatic but SBP soft and dizzy when standing.  Stop coreg due to profound fatigue.  - Continue  lisinopril 2.5 mg qhs. - Continue  spironolactone 12.5 mg qam.  Check BMET today  3. PAD: Leg weakness is a major complaint.  11/16  peripheral angiography per report showed bilateral SFA occlusions.  Peripheral arterial dopplers in our system in 5/17 also showed bilateral SFA occlusions.   - PV referral. 4. VT: Likely scar-mediated.  He is now on amiodarone.   - Continue amiodarone 200 mg daily.  TSH and LFTs normal recently.  Will need regular eye exams with amiodarone use.  5. Anemia: GI bleeding of uncertain etiology in 3/17.  CBC today.  6. Aortic stenosis: Appeared moderate on last echo.  7. Mitral regurgitation: Moderate to severe, likely infarct-related.  8. AAA: 4.5 cm in 5/17.  Repeat abdominal US in 11/17.  9. Fatigue- Check CBC today.    Follow up in 2 wks with Dr Shirlee Latch. May need to discuss Palliative Care at next visit.  Tonye Becket NP- C 01/27/2016

## 2016-01-30 ENCOUNTER — Telehealth (HOSPITAL_COMMUNITY): Payer: Self-pay | Admitting: Surgery

## 2016-01-30 ENCOUNTER — Telehealth: Payer: Self-pay | Admitting: Family Medicine

## 2016-01-30 NOTE — Telephone Encounter (Signed)
Called and d/w Buzzy HanJessie- Johm did have a dark stool but OW seems to be doing ok.  They will do an CBC for me next week,  Called Toniann FailWendy and asked them to please bring him in to see me next week- [facility]

## 2016-01-30 NOTE — Telephone Encounter (Signed)
And[facility]Jesse [facility] regarding Mr. Delton Seeelson and the fact that he has been experiencing black tarry stools recently and has a history of GI bleed.  He was advised to call the patient's PCP regarding these GI symptoms.

## 2016-01-30 NOTE — Telephone Encounter (Signed)
Caller name: Brayton CavesJessie  Relation to pt: [facility] Call back number: [phone]   Reason for call:  Nurse wanted to inform PCP patient has been constipated since last Wednesday, stool softner was taken Wednesaday and patient had a large BM describe as the color of tar. BP is normal. Please advis

## 2016-02-02 ENCOUNTER — Telehealth (HOSPITAL_COMMUNITY): Payer: Self-pay | Admitting: Cardiology

## 2016-02-02 MED ORDER — RANOLAZINE ER 500 MG PO TB12: 500.0000 mg | ORAL_TABLET | Freq: Two times a day (BID) | ORAL | Status: DC

## 2016-02-02 NOTE — Telephone Encounter (Signed)
refiil requested

## 2016-02-03 ENCOUNTER — Ambulatory Visit (INDEPENDENT_AMBULATORY_CARE_PROVIDER_SITE_OTHER): Payer: Medicare Other

## 2016-02-03 DIAGNOSIS — I5022 Chronic systolic (congestive) heart failure: Secondary | ICD-10-CM

## 2016-02-03 DIAGNOSIS — Z95810 Presence of automatic (implantable) cardiac defibrillator: Secondary | ICD-10-CM

## 2016-02-03 NOTE — Progress Notes (Signed)
EPIC Encounter for ICM Monitoring  Patient Name: Andre Wilson is a 80 y.o. male Date: 02/03/2016 Primary Care Physican: Abbe AmsterdamOPLAND,JESSICA, MD Primary Cardiologist: Shirlee LatchMcLean Electrophysiologist: Camnitz Dry Weight: 139 lbs       In the past month, have you:  1. Gained more than 2 pounds in a day or more than 5 pounds in a week? no  2. Had changes in your medications (with verification of current medications)? no  3. Had more shortness of breath than is usual for you? no  4. Limited your activity because of shortness of breath? no  5. Not been able to sleep because of shortness of breath? no  6. Had increased swelling in your feet, ankles, legs or stomach area? no  7. Had symptoms of dehydration (dizziness, dry mouth, increased thirst, decreased urine output) no  8. Had changes in sodium restriction? no  9. Been compliant with medication? Yes  ICM trend: 3 month view for 02/03/2016   ICM trend: 1 year view for 02/03/2016   Follow-up plan: ICM clinic phone appointment 03/09/2016.  1st ICM encounter. Spoke with daughter Andre Wilson [facility]).  He has appointment at CHF clinic 02/10/2016.  FLUID LEVELS:  Corvue thoracic impedance above baseline suggesting no fluid accumulation.  New device and impedance is still forming patient's baseline.     SYMPTOMS:   She denied patient has had any symptoms such as weight gain of 3 pounds overnight or 5 pounds within a week, SOB and/or lower extremity swelling.   Encouraged to call for any fluid symptoms.  She stated patient has very little appetite and he is drinking Ensure for nutrition.  She provides small low salt mini meals hoping he will eat more.   She stated he has had to give up a lot of independence since he has been sick and thinks he has had a little depression but that is improving.   He has not had any chest pain.    EDUCATION: Limit sodium intake to < 2000 mg and fluid intake to 64 oz daily.     RECOMMENDATIONS: No changes today.      Karie SodaLaurie S Kylar Speelman, RN, CCM 02/03/2016 10:13 AM

## 2016-02-10 ENCOUNTER — Ambulatory Visit (HOSPITAL_COMMUNITY)
Admission: RE | Admit: 2016-02-10 | Discharge: 2016-02-10 | Disposition: A | Discharge status: Home / Self Care | Payer: Medicare Other | Source: Ambulatory Visit | Attending: Internal Medicine | Admitting: Internal Medicine

## 2016-02-10 ENCOUNTER — Encounter (HOSPITAL_COMMUNITY): Payer: Self-pay | Admitting: Cardiology

## 2016-02-10 ENCOUNTER — Other Ambulatory Visit (HOSPITAL_COMMUNITY): Payer: Self-pay

## 2016-02-10 VITALS — BP 88/62 | HR 73 | Wt 142.6 lb

## 2016-02-10 DIAGNOSIS — I739 Peripheral vascular disease, unspecified: Secondary | ICD-10-CM | POA: Diagnosis not present

## 2016-02-10 DIAGNOSIS — I2510 Atherosclerotic heart disease of native coronary artery without angina pectoris: Secondary | ICD-10-CM | POA: Insufficient documentation

## 2016-02-10 DIAGNOSIS — I714 Abdominal aortic aneurysm, without rupture: Secondary | ICD-10-CM | POA: Diagnosis not present

## 2016-02-10 DIAGNOSIS — I255 Ischemic cardiomyopathy: Secondary | ICD-10-CM | POA: Insufficient documentation

## 2016-02-10 DIAGNOSIS — I5022 Chronic systolic (congestive) heart failure: Secondary | ICD-10-CM | POA: Insufficient documentation

## 2016-02-10 DIAGNOSIS — E785 Hyperlipidemia, unspecified: Secondary | ICD-10-CM | POA: Diagnosis not present

## 2016-02-10 DIAGNOSIS — Z7902 Long term (current) use of antithrombotics/antiplatelets: Secondary | ICD-10-CM | POA: Insufficient documentation

## 2016-02-10 DIAGNOSIS — Z8249 Family history of ischemic heart disease and other diseases of the circulatory system: Secondary | ICD-10-CM | POA: Diagnosis not present

## 2016-02-10 DIAGNOSIS — R5383 Other fatigue: Secondary | ICD-10-CM | POA: Diagnosis not present

## 2016-02-10 DIAGNOSIS — Z95810 Presence of automatic (implantable) cardiac defibrillator: Secondary | ICD-10-CM | POA: Diagnosis not present

## 2016-02-10 DIAGNOSIS — I080 Rheumatic disorders of both mitral and aortic valves: Secondary | ICD-10-CM | POA: Diagnosis not present

## 2016-02-10 DIAGNOSIS — R531 Weakness: Secondary | ICD-10-CM | POA: Insufficient documentation

## 2016-02-10 DIAGNOSIS — I25810 Atherosclerosis of coronary artery bypass graft(s) without angina pectoris: Secondary | ICD-10-CM

## 2016-02-10 DIAGNOSIS — I252 Old myocardial infarction: Secondary | ICD-10-CM | POA: Diagnosis not present

## 2016-02-10 DIAGNOSIS — R0600 Dyspnea, unspecified: Secondary | ICD-10-CM

## 2016-02-10 DIAGNOSIS — Z951 Presence of aortocoronary bypass graft: Secondary | ICD-10-CM | POA: Insufficient documentation

## 2016-02-10 DIAGNOSIS — Z7982 Long term (current) use of aspirin: Secondary | ICD-10-CM | POA: Diagnosis not present

## 2016-02-10 DIAGNOSIS — Z79899 Other long term (current) drug therapy: Secondary | ICD-10-CM | POA: Insufficient documentation

## 2016-02-10 DIAGNOSIS — D649 Anemia, unspecified: Secondary | ICD-10-CM | POA: Diagnosis not present

## 2016-02-10 LAB — CBC
HCT: 32.5 % — ABNORMAL LOW (ref 39.0–52.0)
Hemoglobin: 10.5 g/dL — ABNORMAL LOW (ref 13.0–17.0)
MCH: 28.6 pg (ref 26.0–34.0)
MCHC: 32.3 g/dL (ref 30.0–36.0)
MCV: 88.6 fL (ref 78.0–100.0)
PLATELETS: 265 10*3/uL (ref 150–400)
RBC: 3.67 MIL/uL — AB (ref 4.22–5.81)
RDW: 15.4 % (ref 11.5–15.5)
WBC: 7.4 10*3/uL (ref 4.0–10.5)

## 2016-02-10 LAB — BASIC METABOLIC PANEL
Anion gap: 10 (ref 5–15)
BUN: 23 mg/dL — AB (ref 6–20)
CALCIUM: 9.5 mg/dL (ref 8.9–10.3)
CO2: 20 mmol/L — ABNORMAL LOW (ref 22–32)
CREATININE: 1.46 mg/dL — AB (ref 0.61–1.24)
Chloride: 102 mmol/L (ref 101–111)
GFR calc Af Amer: 52 mL/min — ABNORMAL LOW (ref >60)
GFR, EST NON AFRICAN AMERICAN: 45 mL/min — AB (ref >60)
Glucose, Bld: 103 mg/dL — ABNORMAL HIGH (ref 65–99)
POTASSIUM: 5 mmol/L (ref 3.5–5.1)
SODIUM: 132 mmol/L — AB (ref 135–145)

## 2016-02-10 LAB — BRAIN NATRIURETIC PEPTIDE: B Natriuretic Peptide: 648.3 pg/mL — ABNORMAL HIGH (ref 0.0–100.0)

## 2016-02-10 LAB — PROTIME-INR
INR: 1.17 (ref 0.00–1.49)
PROTHROMBIN TIME: 15 s (ref 11.6–15.2)

## 2016-02-10 NOTE — Progress Notes (Signed)
Patient ID: Andre Wilson, male   DOB: 09-Jan-1936, 80 y.o.   MRN: [id number]   PCP: Dr. Arthor Captain Cardiology: Dr. Shirlee Latch  80 yo with history of CAD s/p CABG, ischemic cardiomyopathy, and recent GI bleed presents for cardiology followup after hospitalization.  Patient has been followed in the past in [place].  He had CABG back in 1987.  He had an admission in [place] in 3/17 with GI bleeding.  Per patient and daughter, bleeding was severe but he did not have endoscopy.  He is back on ASA 81 and Plavix.    He was admitted to [facility] in 4/17 with acute on chronic systolic CHF.  After hospitalization, he had a VT arrest.  Echo showed EF 25-30% with moderately decreased RV function, probably moderate AS, and mod-severe MR.  LHC was done, showing 3/4 occluded bypass grafts.  LIMA-LAD was patent but had 70-75% anastomotic stenosis.  He was medically managed. RHC showed near-normal filling pressures.  St Jude ICD was placed.   Admitted 01/18/16 with anterior STEMI, TnI to 45.  Repeat LHC showed similar anatomy to 4/17 LHC with diffusely diseased RCA, totally occluded LCx and LAD, and 75% anastomotic lesion at LIMA touchdown. Touchdown lesion was likely the culprit. His last 2 admissions were likely ischemia-related, VT arrest in 4/17 and anterior STEMI in 5/17. He had successful PCI to the LIMA-LAD anastomotic lesion on 01/20/16. Discharge weight was 140 pounds.   He returns for HF follow up. Last visit Coreg was stopped due to profound fatigue. Today he is complaining of ongoing fatigue.  SOB with ADLs.  No chest pain.  Appetite fair. Ambulates with a walker. Limited ambulation due to fatigue as well as leg heaviness (PAD plays a role).  Weight at home has been slowly decreasing. No orthopnea/PND.  BP low, has episodic lightheadedness.  [facility] RN following. Lives with his daughter and she prepares his medications.  No chest pain.   Labs (4/17): K 4.5, creatinine 1.42, LDL 120, HCT 31.7 Labs  (5/17): K 4.3, creatinine 1.49, BNP 645, TSH normal, HCT 38.8, LFTs normal Labs (01/27/2016):  K 5.2, Creatinine 1.68, BNP 688, Hgb 10.9   PMH: 1. CAD: S/p CABG 1987.  - LHC (4/17) with total occlusion of vein grafts x 3.  LIMA-LAD patent with 70-75% anastomotic stenosis.  Total occlusion of the LAD, LCx, and diffuse disease throughout the RCA.  He was managed medically.  - Admission 5/17 with anterior STEMI, troponin to 45.  LHC in 5/17 showed similar anatomy to 4/17 with diffusely diseased RCA, totally occluded LCx and LAD, and 75% anastomotic lesion at LIMA touchdown. Touchdown lesion was likely the culprit. He had successful PCI to the LIMA-LAD anastomotic lesion on 01/20/16. 2. Chronic systolic CHF: Ischemic cardiomyopathy.  St Jude ICD.  - Echo (4/17): EF 25-30%, inferolateral akinesis, moderately decreased RV systolic function, ?moderate aortic stenosis but mean gradient 10 mmHg, moderate to severe MR.  - RHC (4/17): mean RA 2, PA 42/14, mean PCWP 15, CI 2.09. - Echo (5/17): EF 25-30%, anteroseptal AK, mild AS, mild AI, moderate MR, PASP 38 mmHg.  3. History of GI bleeding: 3/17.  Per report, bleeding was severe, but he did not have endoscopy done.   4. PAD: 11/16 cath showed occluded bilateral SFAs.   - Lower extremity dopplers (5/17) with bilateral SFA occlusions, > 50% left external iliac stenosis.  5. VT: 4/17 admit with VT, loss of consciousness.  6. Hyperlipidemia 7. Aortic stenosis: Probably moderate on 4/17 echo, mild  on 5/17 echo.  8. Mitral regurgitation: Moderate to severe on 4/17 echo, suspect infarct-related.  Moderate on 5/17 echo.  9. AAA: Abdominal US 5/17 with 4.5 cm AAA.   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  Nonsmoker (since college years), no ETOH.   FH: CAD  ROS: All systems reviewed and negative except as per HPI.   Current Outpatient Prescriptions  Medication Sig Dispense Refill  . amiodarone (PACERONE) 200 MG tablet  Take 1 tablet (200 mg total) by mouth daily. 30 tablet 3  . aspirin EC 81 MG EC tablet Take 1 tablet (81 mg total) by mouth daily. 30 tablet 3  . atorvastatin (LIPITOR) 80 MG tablet Take 1 tablet (80 mg total) by mouth daily. 30 tablet 6  . clopidogrel (PLAVIX) 75 MG tablet Take 75 mg by mouth daily.    Marland Kitchen. ezetimibe (ZETIA) 10 MG tablet Take 1 tablet (10 mg total) by mouth daily. 30 tablet 1  . ferrous sulfate 325 (65 FE) MG EC tablet Take 325 mg by mouth 3 (three) times daily with meals.    . folic acid (FOLVITE) 1 MG tablet Take 1 mg by mouth daily.    . furosemide (LASIX) 40 MG tablet Take 1 tablet (40 mg total) by mouth daily. 30 tablet 6  . latanoprost (XALATAN) 0.005 % ophthalmic solution Place 1 drop into both eyes at bedtime.  0  . linaclotide (LINZESS) 145 MCG CAPS capsule Take 145 mcg by mouth daily as needed (for constipation). Reported on 01/01/2016    . lisinopril (PRINIVIL,ZESTRIL) 2.5 MG tablet Take 1 tablet (2.5 mg total) by mouth every evening. 30 tablet 3  . nitroGLYCERIN (NITROSTAT) 0.4 MG SL tablet Place 0.4 mg under the tongue every 5 (five) minutes as needed for chest pain.    Marland Kitchen. oxyCODONE-acetaminophen (PERCOCET/ROXICET) 5-325 MG tablet Take 1 tablet by mouth every 6 (six) hours as needed for severe pain. 10 tablet 0  . promethazine (PHENERGAN) 12.5 MG tablet Take 1 tablet (12.5 mg total) by mouth every 6 (six) hours as needed for nausea or vomiting. 30 tablet 0  . ranolazine (RANEXA) 500 MG 12 hr tablet Take 1 tablet (500 mg total) by mouth 2 (two) times daily. 60 tablet 1  . timolol (TIMOPTIC) 0.5 % ophthalmic solution Place 1 drop into both eyes 2 (two) times daily.  0  . traZODone (DESYREL) 50 MG tablet Take 1 tablet (50 mg total) by mouth at bedtime as needed for sleep. 90 tablet 3  . vitamin C (ASCORBIC ACID) 500 MG tablet Take 500 mg by mouth 2 (two) times daily.     No current facility-administered medications for this encounter.   BP 88/62 mmHg  Pulse 73  Wt 142 lb  9.6 oz (64.683 kg)  SpO2 99%   General: NAD, thin Daughter present. Arrive in a wheel chair.  Neck: No JVD, no thyromegaly or thyroid nodule.  Lungs: Clear to auscultation bilaterally with normal respiratory effort. CV: Nondisplaced PMI.  Heart regular S1/S2, no S3/S4, 2/6 SEM RUSB with clear S2.  No peripheral edema.  No carotid bruit.  Unable to palpate pedal pulses.  Abdomen: Soft, nontender, no hepatosplenomegaly, no distention.  Skin: Intact without lesions or rashes.  Neurologic: Alert and oriented x 3.  Psych: Normal affect. Extremities: No clubbing or cyanosis.  HEENT: Normal.   Assessment/Plan: 1. CAD: s/p CABG 1987.  4/17 cath showed all SVGs occluded.  The LIMA-LAD had a 70-75% anastomotic lesion.  Readmitted with  anterior STEMI in 5/17.  Repeat cath showed similar anatomy to 4/17 cath.  Had PCI 01/20/16 to LIMA-LAD anastomotic lesion.  No further chest pain.  - Continue ASA 81 and Plavix.  - On high dose atorvastatin  - He can continue Ranexa.  2. Chronic systolic CHF: Ischemic cardiomyopathy.  EF 20-25% on 4/17 echo, 25-30% on 5/17 echo. Has St Jude ICD.  Corevue was ok today. He is not volume overloaded on exam. NYHA IIIB symptoms, primarily due to severe fatigue.  SBP running in 80s and has some lightheadedness.  - Off coreg due to profound fatigue.  - Add 0.125 mg digoxin daily, will need to follow level closely with CKD.    - Stop lisinopril due to hypotension.  - Continue spironolactone 12.5 mg qam - Set RHC Thursday. May need milrinone. We discussed risks/benefits of the procedure with him today and he agrees to go forward with it.   3. PAD: Leg weakness is a major complaint.  11/16 peripheral angiography per report showed bilateral SFA occlusions.  Peripheral arterial dopplers in our system in 5/17 also showed bilateral SFA occlusions.   - PV referral has been made, he has yet to have the appt. 4. VT: Likely scar-mediated.  He is now on amiodarone.  He has a Secondary school teachert Jude ICD.   - Continue amiodarone 200 mg daily.  TSH and LFTs normal recently.  Will need regular eye exams with amiodarone use.  5. Anemia: GI bleeding of uncertain etiology in 3/17.  CBC today.  6. Aortic stenosis: Appeared mild on last echo.  7. Mitral regurgitation: Moderate on last echo, likely infarct-related.  8. AAA: 4.5 cm in 5/17.  Repeat abdominal US in 11/17.  9. Fatigue: Recent hgb ok, ?low output.   Explained RHC and possibility of hospital admit for home inotropes.   Follow up in 4 weeks.   Tonye BecketAmy Clegg NP- C 02/10/2016   Patient seen with NP, agree with the above note.  Mr Andre Wilson continues to have profound fatigue and low blood pressure (SBP in 80s).  I am concerned that he may have low output heart failure.  He does not have chest pain (recent PCI to LIMA-LAD anastomotic lesion).   - I am going to start him on digoxin today and stop lisinopril.  Will get BMET today.  - I think he needs RHC, will arrange for later this week.  If low output, he would be a candidate for home milrinone.  We discussed milrinone, and he understands that it is a palliative measure and can make him feel better but not necessarily live longer.  He is not a transplant candidate.  LVAD would be a difficult proposition given prior sternotomy, advanced age, poor baseline functional capacity, and CKD.  However, can revisit this after cath.   Marca AnconaDalton Vianney Kopecky 02/10/2016 1:44 PM

## 2016-02-10 NOTE — Progress Notes (Signed)
Advanced Heart Failure Medication Review by a Pharmacist  Does the patient  feel that his/her medications are working for him/her?  yes  Has the patient been experiencing any side effects to the medications prescribed?  no  Does the patient measure his/her own blood pressure or blood glucose at home?  yes   Does the patient have any problems obtaining medications due to transportation or finances?   no  Understanding of regimen: fair Understanding of indications: fair Potential of compliance: excellent Patient understands to avoid NSAIDs. Patient understands to avoid decongestants.   Pharmacist comments: Mr. Andre Wilson is a pleasant [age] yom presenting to the clinic with his daughter for a follow-up appointment. He denies any current mediation-related side effects. He does endorse feeling more drowsy lately but denies any SOB and swelling. He states that he rarely misses any doses of his medications and utilizes a pillbox to keep track of his medications (daughter helps with this as well). Patient did not have any medication-related questions or concerns at that time.   Woodrow Drab C. Marvis MoellerMiles, PharmD Pharmacy Resident  Pager: [phone] 02/10/2016 12:45 PM   Time with patient: 12 min  Preparation and documentation time: 2 min  Total time: 14 min

## 2016-02-10 NOTE — Patient Instructions (Signed)
Labs today  STOP Lisinopril START Digoxin 0.125 mg,one tab daily  Your physician has requested that you have a cardiac catheterization. Cardiac catheterization is used to diagnose and/or treat various heart conditions. Doctors may recommend this procedure for a number of different reasons. The most common reason is to evaluate chest pain. Chest pain can be a symptom of coronary artery disease (CAD), and cardiac catheterization can show whether plaque is narrowing or blocking your heart's arteries. This procedure is also used to evaluate the valves, as well as measure the blood flow and oxygen levels in different parts of your heart. For further information please visit [email or web address]. Please follow instruction sheet, as given.

## 2016-02-11 ENCOUNTER — Telehealth (HOSPITAL_COMMUNITY): Payer: Self-pay

## 2016-02-11 MED ORDER — DIGOXIN 125 MCG PO TABS: 0.1250 mg | ORAL_TABLET | Freq: Every day | ORAL | Status: DC

## 2016-02-11 NOTE — Telephone Encounter (Signed)
VO given per Mclean to do extra nursing visit next week since patient has cath tomorrow.  Ave FilterBradley, Megan Genevea

## 2016-02-11 NOTE — Addendum Note (Signed)
Encounter addended by: Theresia Boughhantel M Kemba Hoppes, CMA on: 02/11/2016 10:24 AM<BR>     Documentation filed: Orders

## 2016-02-12 ENCOUNTER — Other Ambulatory Visit (HOSPITAL_COMMUNITY): Payer: Self-pay | Admitting: *Deleted

## 2016-02-12 ENCOUNTER — Encounter (HOSPITAL_COMMUNITY): Payer: Self-pay | Admitting: Cardiology

## 2016-02-12 ENCOUNTER — Encounter (HOSPITAL_COMMUNITY)
Admission: RE | Disposition: A | Discharge status: Home / Self Care | Payer: Self-pay | Source: Ambulatory Visit | Attending: Cardiology

## 2016-02-12 ENCOUNTER — Ambulatory Visit (HOSPITAL_COMMUNITY)
Admission: RE | Admit: 2016-02-12 | Discharge: 2016-02-12 | Disposition: A | Discharge status: Home / Self Care | Payer: Medicare Other | Source: Ambulatory Visit | Attending: Cardiology | Admitting: Cardiology

## 2016-02-12 DIAGNOSIS — I080 Rheumatic disorders of both mitral and aortic valves: Secondary | ICD-10-CM | POA: Diagnosis not present

## 2016-02-12 DIAGNOSIS — I2582 Chronic total occlusion of coronary artery: Secondary | ICD-10-CM | POA: Insufficient documentation

## 2016-02-12 DIAGNOSIS — I252 Old myocardial infarction: Secondary | ICD-10-CM | POA: Diagnosis not present

## 2016-02-12 DIAGNOSIS — I714 Abdominal aortic aneurysm, without rupture: Secondary | ICD-10-CM | POA: Insufficient documentation

## 2016-02-12 DIAGNOSIS — Z951 Presence of aortocoronary bypass graft: Secondary | ICD-10-CM | POA: Insufficient documentation

## 2016-02-12 DIAGNOSIS — I255 Ischemic cardiomyopathy: Secondary | ICD-10-CM | POA: Diagnosis not present

## 2016-02-12 DIAGNOSIS — E785 Hyperlipidemia, unspecified: Secondary | ICD-10-CM | POA: Insufficient documentation

## 2016-02-12 DIAGNOSIS — D649 Anemia, unspecified: Secondary | ICD-10-CM | POA: Diagnosis not present

## 2016-02-12 DIAGNOSIS — Z7982 Long term (current) use of aspirin: Secondary | ICD-10-CM | POA: Diagnosis not present

## 2016-02-12 DIAGNOSIS — Z7902 Long term (current) use of antithrombotics/antiplatelets: Secondary | ICD-10-CM | POA: Diagnosis not present

## 2016-02-12 DIAGNOSIS — R0600 Dyspnea, unspecified: Secondary | ICD-10-CM

## 2016-02-12 DIAGNOSIS — I739 Peripheral vascular disease, unspecified: Secondary | ICD-10-CM | POA: Insufficient documentation

## 2016-02-12 DIAGNOSIS — I509 Heart failure, unspecified: Secondary | ICD-10-CM | POA: Diagnosis not present

## 2016-02-12 DIAGNOSIS — I2510 Atherosclerotic heart disease of native coronary artery without angina pectoris: Secondary | ICD-10-CM | POA: Diagnosis not present

## 2016-02-12 DIAGNOSIS — K922 Gastrointestinal hemorrhage, unspecified: Secondary | ICD-10-CM | POA: Insufficient documentation

## 2016-02-12 DIAGNOSIS — I5022 Chronic systolic (congestive) heart failure: Secondary | ICD-10-CM | POA: Insufficient documentation

## 2016-02-12 DIAGNOSIS — I272 Other secondary pulmonary hypertension: Secondary | ICD-10-CM | POA: Insufficient documentation

## 2016-02-12 HISTORY — PX: CARDIAC CATHETERIZATION: SHX172

## 2016-02-12 LAB — POCT I-STAT 3, VENOUS BLOOD GAS (G3P V)
ACID-BASE DEFICIT: 3 mmol/L — AB (ref 0.0–2.0)
ACID-BASE DEFICIT: 4 mmol/L — AB (ref 0.0–2.0)
BICARBONATE: 22.2 meq/L (ref 20.0–24.0)
Bicarbonate: 21 mEq/L (ref 20.0–24.0)
O2 SAT: 59 %
O2 SAT: 61 %
PH VEN: 7.361 — AB (ref 7.250–7.300)
PO2 VEN: 33 mmHg (ref 31.0–45.0)
TCO2: 22 mmol/L (ref 0–100)
TCO2: 23 mmol/L (ref 0–100)
pCO2, Ven: 36.6 mmHg — ABNORMAL LOW (ref 45.0–50.0)
pCO2, Ven: 39.2 mmHg — ABNORMAL LOW (ref 45.0–50.0)
pH, Ven: 7.368 — ABNORMAL HIGH (ref 7.250–7.300)
pO2, Ven: 32 mmHg (ref 31.0–45.0)

## 2016-02-12 SURGERY — RIGHT HEART CATH

## 2016-02-12 MED ORDER — MIDAZOLAM HCL 2 MG/2ML IJ SOLN
INTRAMUSCULAR | Status: DC | PRN
  Administered 2016-02-12: 0.5 mg via INTRAVENOUS

## 2016-02-12 MED ORDER — SODIUM CHLORIDE 0.9 % IV SOLN
INTRAVENOUS | Status: DC
  Administered 2016-02-12: 07:00:00 via INTRAVENOUS

## 2016-02-12 MED ORDER — HEPARIN (PORCINE) IN NACL 2-0.9 UNIT/ML-% IJ SOLN
INTRAMUSCULAR | Status: AC
  Filled 2016-02-12: qty 500

## 2016-02-12 MED ORDER — ACETAMINOPHEN 325 MG PO TABS: 650.0000 mg | ORAL_TABLET | ORAL | Status: DC | PRN

## 2016-02-12 MED ORDER — MIDAZOLAM HCL 2 MG/2ML IJ SOLN
INTRAMUSCULAR | Status: AC
  Filled 2016-02-12: qty 2

## 2016-02-12 MED ORDER — SODIUM CHLORIDE 0.9% FLUSH: 3.0000 mL | Freq: Two times a day (BID) | INTRAVENOUS | Status: DC

## 2016-02-12 MED ORDER — ONDANSETRON HCL 4 MG/2ML IJ SOLN: 4.0000 mg | Freq: Four times a day (QID) | INTRAMUSCULAR | Status: DC | PRN

## 2016-02-12 MED ORDER — SODIUM CHLORIDE 0.9 % IV SOLN: 250.0000 mL | INTRAVENOUS | Status: DC | PRN

## 2016-02-12 MED ORDER — LIDOCAINE HCL (PF) 1 % IJ SOLN
INTRAMUSCULAR | Status: DC | PRN
  Administered 2016-02-12: 2 mL

## 2016-02-12 MED ORDER — LIDOCAINE HCL (PF) 1 % IJ SOLN
INTRAMUSCULAR | Status: AC
  Filled 2016-02-12: qty 30

## 2016-02-12 MED ORDER — HEPARIN (PORCINE) IN NACL 2-0.9 UNIT/ML-% IJ SOLN
INTRAMUSCULAR | Status: DC | PRN
  Administered 2016-02-12: 500 mL

## 2016-02-12 MED ORDER — SODIUM CHLORIDE 0.9 % WEIGHT BASED INFUSION: 3.0000 mL/kg/h | INTRAVENOUS | Status: DC

## 2016-02-12 MED ORDER — FENTANYL CITRATE (PF) 100 MCG/2ML IJ SOLN
INTRAMUSCULAR | Status: AC
  Filled 2016-02-12: qty 2

## 2016-02-12 MED ORDER — FENTANYL CITRATE (PF) 100 MCG/2ML IJ SOLN
INTRAMUSCULAR | Status: DC | PRN
  Administered 2016-02-12: 25 ug via INTRAVENOUS

## 2016-02-12 MED ORDER — SODIUM CHLORIDE 0.9% FLUSH: 3.0000 mL | INTRAVENOUS | Status: DC | PRN

## 2016-02-12 MED ORDER — ATORVASTATIN CALCIUM 80 MG PO TABS: 80.0000 mg | ORAL_TABLET | Freq: Every day | ORAL | Status: DC

## 2016-02-12 SURGICAL SUPPLY — 6 items
CATH BALLN WEDGE 5F 110CM (CATHETERS) ×3 IMPLANT
PACK CARDIAC CATHETERIZATION (CUSTOM PROCEDURE TRAY) ×3 IMPLANT
SHEATH FAST CATH BRACH 5F 5CM (SHEATH) ×3 IMPLANT
TRANSDUCER W/STOPCOCK (MISCELLANEOUS) ×3 IMPLANT
TUBING ART PRESS 72  MALE/FEM (TUBING) ×2
TUBING ART PRESS 72 MALE/FEM (TUBING) ×1 IMPLANT

## 2016-02-12 NOTE — H&P (View-Only) (Signed)
Patient ID: Andre Wilson, male   DOB: 10/11/1935, 79 y.o.   MRN: [id number]   PCP: Dr. J. Copland Cardiology: Dr. Saburo Luger  79 yo with history of CAD s/p CABG, ischemic cardiomyopathy, and recent GI bleed presents for cardiology followup after hospitalization.  Patient has been followed in the past in [place].  He had CABG back in 1987.  He had an admission in [place] in 3/17 with GI bleeding.  Per patient and daughter, bleeding was severe but he did not have endoscopy.  He is back on ASA 81 and Plavix.    He was admitted to [facility] in 4/17 with acute on chronic systolic CHF.  After hospitalization, he had a VT arrest.  Echo showed EF 25-30% with moderately decreased RV function, probably moderate AS, and mod-severe MR.  LHC was done, showing 3/4 occluded bypass grafts.  LIMA-LAD was patent but had 70-75% anastomotic stenosis.  He was medically managed. RHC showed near-normal filling pressures.  St Jude ICD was placed.   Admitted 01/18/16 with anterior STEMI, TnI to 45.  Repeat LHC showed similar anatomy to 4/17 LHC with diffusely diseased RCA, totally occluded LCx and LAD, and 75% anastomotic lesion at LIMA touchdown. Touchdown lesion was likely the culprit. His last 2 admissions were likely ischemia-related, VT arrest in 4/17 and anterior STEMI in 5/17. He had successful PCI to the LIMA-LAD anastomotic lesion on 01/20/16. Discharge weight was 140 pounds.   He returns for HF follow up. Last visit Coreg was stopped due to profound fatigue. Today he is complaining of ongoing fatigue.  SOB with ADLs.  No chest pain.  Appetite fair. Ambulates with a walker. Limited ambulation due to fatigue as well as leg heaviness (PAD plays a role).  Weight at home has been slowly decreasing. No orthopnea/PND.  BP low, has episodic lightheadedness.  [facility] RN following. Lives with his daughter and she prepares his medications.  No chest pain.   Labs (4/17): K 4.5, creatinine 1.42, LDL 120, HCT 31.7 Labs  (5/17): K 4.3, creatinine 1.49, BNP 645, TSH normal, HCT 38.8, LFTs normal Labs (01/27/2016):  K 5.2, Creatinine 1.68, BNP 688, Hgb 10.9   PMH: 1. CAD: S/p CABG 1987.  - LHC (4/17) with total occlusion of vein grafts x 3.  LIMA-LAD patent with 70-75% anastomotic stenosis.  Total occlusion of the LAD, LCx, and diffuse disease throughout the RCA.  He was managed medically.  - Admission 5/17 with anterior STEMI, troponin to 45.  LHC in 5/17 showed similar anatomy to 4/17 with diffusely diseased RCA, totally occluded LCx and LAD, and 75% anastomotic lesion at LIMA touchdown. Touchdown lesion was likely the culprit. He had successful PCI to the LIMA-LAD anastomotic lesion on 01/20/16. 2. Chronic systolic CHF: Ischemic cardiomyopathy.  St Jude ICD.  - Echo (4/17): EF 25-30%, inferolateral akinesis, moderately decreased RV systolic function, ?moderate aortic stenosis but mean gradient 10 mmHg, moderate to severe MR.  - RHC (4/17): mean RA 2, PA 42/14, mean PCWP 15, CI 2.09. - Echo (5/17): EF 25-30%, anteroseptal AK, mild AS, mild AI, moderate MR, PASP 38 mmHg.  3. History of GI bleeding: 3/17.  Per report, bleeding was severe, but he did not have endoscopy done.   4. PAD: 11/16 cath showed occluded bilateral SFAs.   - Lower extremity dopplers (5/17) with bilateral SFA occlusions, > 50% left external iliac stenosis.  5. VT: 4/17 admit with VT, loss of consciousness.  6. Hyperlipidemia 7. Aortic stenosis: Probably moderate on 4/17 echo, mild   on 5/17 echo.  8. Mitral regurgitation: Moderate to severe on 4/17 echo, suspect infarct-related.  Moderate on 5/17 echo.  9. AAA: Abdominal US 5/17 with 4.5 cm AAA.   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  Nonsmoker (since college years), no ETOH.   FH: CAD  ROS: All systems reviewed and negative except as per HPI.   Current Outpatient Prescriptions  Medication Sig Dispense Refill  . amiodarone (PACERONE) 200 MG tablet  Take 1 tablet (200 mg total) by mouth daily. 30 tablet 3  . aspirin EC 81 MG EC tablet Take 1 tablet (81 mg total) by mouth daily. 30 tablet 3  . atorvastatin (LIPITOR) 80 MG tablet Take 1 tablet (80 mg total) by mouth daily. 30 tablet 6  . clopidogrel (PLAVIX) 75 MG tablet Take 75 mg by mouth daily.    Marland Kitchen. ezetimibe (ZETIA) 10 MG tablet Take 1 tablet (10 mg total) by mouth daily. 30 tablet 1  . ferrous sulfate 325 (65 FE) MG EC tablet Take 325 mg by mouth 3 (three) times daily with meals.    . folic acid (FOLVITE) 1 MG tablet Take 1 mg by mouth daily.    . furosemide (LASIX) 40 MG tablet Take 1 tablet (40 mg total) by mouth daily. 30 tablet 6  . latanoprost (XALATAN) 0.005 % ophthalmic solution Place 1 drop into both eyes at bedtime.  0  . linaclotide (LINZESS) 145 MCG CAPS capsule Take 145 mcg by mouth daily as needed (for constipation). Reported on 01/01/2016    . lisinopril (PRINIVIL,ZESTRIL) 2.5 MG tablet Take 1 tablet (2.5 mg total) by mouth every evening. 30 tablet 3  . nitroGLYCERIN (NITROSTAT) 0.4 MG SL tablet Place 0.4 mg under the tongue every 5 (five) minutes as needed for chest pain.    Marland Kitchen. oxyCODONE-acetaminophen (PERCOCET/ROXICET) 5-325 MG tablet Take 1 tablet by mouth every 6 (six) hours as needed for severe pain. 10 tablet 0  . promethazine (PHENERGAN) 12.5 MG tablet Take 1 tablet (12.5 mg total) by mouth every 6 (six) hours as needed for nausea or vomiting. 30 tablet 0  . ranolazine (RANEXA) 500 MG 12 hr tablet Take 1 tablet (500 mg total) by mouth 2 (two) times daily. 60 tablet 1  . timolol (TIMOPTIC) 0.5 % ophthalmic solution Place 1 drop into both eyes 2 (two) times daily.  0  . traZODone (DESYREL) 50 MG tablet Take 1 tablet (50 mg total) by mouth at bedtime as needed for sleep. 90 tablet 3  . vitamin C (ASCORBIC ACID) 500 MG tablet Take 500 mg by mouth 2 (two) times daily.     No current facility-administered medications for this encounter.   BP 88/62 mmHg  Pulse 73  Wt 142 lb  9.6 oz (64.683 kg)  SpO2 99%   General: NAD, thin Daughter present. Arrive in a wheel chair.  Neck: No JVD, no thyromegaly or thyroid nodule.  Lungs: Clear to auscultation bilaterally with normal respiratory effort. CV: Nondisplaced PMI.  Heart regular S1/S2, no S3/S4, 2/6 SEM RUSB with clear S2.  No peripheral edema.  No carotid bruit.  Unable to palpate pedal pulses.  Abdomen: Soft, nontender, no hepatosplenomegaly, no distention.  Skin: Intact without lesions or rashes.  Neurologic: Alert and oriented x 3.  Psych: Normal affect. Extremities: No clubbing or cyanosis.  HEENT: Normal.   Assessment/Plan: 1. CAD: s/p CABG 1987.  4/17 cath showed all SVGs occluded.  The LIMA-LAD had a 70-75% anastomotic lesion.  Readmitted with  anterior STEMI in 5/17.  Repeat cath showed similar anatomy to 4/17 cath.  Had PCI 01/20/16 to LIMA-LAD anastomotic lesion.  No further chest pain.  - Continue ASA 81 and Plavix.  - On high dose atorvastatin  - He can continue Ranexa.  2. Chronic systolic CHF: Ischemic cardiomyopathy.  EF 20-25% on 4/17 echo, 25-30% on 5/17 echo. Has St Jude ICD.  Corevue was ok today. He is not volume overloaded on exam. NYHA IIIB symptoms, primarily due to severe fatigue.  SBP running in 80s and has some lightheadedness.  - Off coreg due to profound fatigue.  - Add 0.125 mg digoxin daily, will need to follow level closely with CKD.    - Stop lisinopril due to hypotension.  - Continue spironolactone 12.5 mg qam - Set RHC Thursday. May need milrinone. We discussed risks/benefits of the procedure with him today and he agrees to go forward with it.   3. PAD: Leg weakness is a major complaint.  11/16 peripheral angiography per report showed bilateral SFA occlusions.  Peripheral arterial dopplers in our system in 5/17 also showed bilateral SFA occlusions.   - PV referral has been made, he has yet to have the appt. 4. VT: Likely scar-mediated.  He is now on amiodarone.  He has a Secondary school teachert Jude ICD.   - Continue amiodarone 200 mg daily.  TSH and LFTs normal recently.  Will need regular eye exams with amiodarone use.  5. Anemia: GI bleeding of uncertain etiology in 3/17.  CBC today.  6. Aortic stenosis: Appeared mild on last echo.  7. Mitral regurgitation: Moderate on last echo, likely infarct-related.  8. AAA: 4.5 cm in 5/17.  Repeat abdominal US in 11/17.  9. Fatigue: Recent hgb ok, ?low output.   Explained RHC and possibility of hospital admit for home inotropes.   Follow up in 4 weeks.   Tonye BecketAmy Clegg NP- C 02/10/2016   Patient seen with NP, agree with the above note.  Mr Andre Wilson continues to have profound fatigue and low blood pressure (SBP in 80s).  I am concerned that he may have low output heart failure.  He does not have chest pain (recent PCI to LIMA-LAD anastomotic lesion).   - I am going to start him on digoxin today and stop lisinopril.  Will get BMET today.  - I think he needs RHC, will arrange for later this week.  If low output, he would be a candidate for home milrinone.  We discussed milrinone, and he understands that it is a palliative measure and can make him feel better but not necessarily live longer.  He is not a transplant candidate.  LVAD would be a difficult proposition given prior sternotomy, advanced age, poor baseline functional capacity, and CKD.  However, can revisit this after cath.   Marca AnconaDalton Dariella Gillihan 02/10/2016 1:44 PM

## 2016-02-12 NOTE — Interval H&P Note (Signed)
History and Physical Interval Note:  02/12/2016 7:36 AM  Andre Wilson  has presented today for surgery, with the diagnosis of hf  The various methods of treatment have been discussed with the patient and family. After consideration of risks, benefits and other options for treatment, the patient has consented to  Procedure(s): Right Heart Cath (N/A) as a surgical intervention .  The patient's history has been reviewed, patient examined, no change in status, stable for surgery.  I have reviewed the patient's chart and labs.  Questions were answered to the patient's satisfaction.     Dalton [employer]

## 2016-02-12 NOTE — Discharge Instructions (Signed)
Angiogram, Care After °Refer to this sheet in the next few weeks. These instructions provide you with information about caring for yourself after your procedure. Your health care provider may also give you more specific instructions. Your treatment has been planned according to current medical practices, but problems sometimes occur. Call your health care provider if you have any problems or questions after your procedure. °WHAT TO EXPECT AFTER THE PROCEDURE °After your procedure, it is typical to have the following: °· Bruising at the catheter insertion site that usually fades within 1-2 weeks. °· Blood collecting in the tissue (hematoma) that may be painful to the touch. It should usually decrease in size and tenderness within 1-2 weeks. °HOME CARE INSTRUCTIONS °· Take medicines only as directed by your health care provider. °· You may shower 24-48 hours after the procedure or as directed by your health care provider. Remove the bandage (dressing) and gently wash the site with plain soap and water. Pat the area dry with a clean towel. Do not rub the site, because this may cause bleeding. °· Do not take baths, swim, or use a hot tub until your health care provider approves. °· Check your insertion site every day for redness, swelling, or drainage. °· Do not apply powder or lotion to the site. °· Do not lift over 10 lb (4.5 kg) for 5 days after your procedure or as directed by your health care provider. °· Ask your health care provider when it is okay to: °¨ Return to work or school. °¨ Resume usual physical activities or sports. °¨ Resume sexual activity. °· Do not drive home if you are discharged the same day as the procedure. Have someone else drive you. °· You may drive 24 hours after the procedure unless otherwise instructed by your health care provider. °· Do not operate machinery or power tools for 24 hours after the procedure or as directed by your health care provider. °· If your procedure was done as an  outpatient procedure, which means that you went home the same day as your procedure, a responsible adult should be with you for the first 24 hours after you arrive home. °· Keep all follow-up visits as directed by your health care provider. This is important. °SEEK MEDICAL CARE IF: °· You have a fever. °· You have chills. °· You have increased bleeding from the catheter insertion site. Hold pressure on the site. °SEEK IMMEDIATE MEDICAL CARE IF: °· You have unusual pain at the catheter insertion site. °· You have redness, warmth, or swelling at the catheter insertion site. °· You have drainage (other than a small amount of blood on the dressing) from the catheter insertion site. °· The catheter insertion site is bleeding, and the bleeding does not stop after 30 minutes of holding steady pressure on the site. °· The area near or just beyond the catheter insertion site becomes pale, cool, tingly, or numb. °  °This information is not intended to replace advice given to you by your health care provider. Make sure you discuss any questions you have with your health care provider. °  °Document Released: 02/25/2005 Document Revised: 08/30/2014 Document Reviewed: 01/10/2013 °Elsevier Interactive Patient Education ©2016 [employer] ° °

## 2016-02-13 ENCOUNTER — Other Ambulatory Visit (HOSPITAL_COMMUNITY): Payer: Self-pay | Admitting: Cardiology

## 2016-02-16 ENCOUNTER — Telehealth (HOSPITAL_COMMUNITY): Payer: Self-pay | Admitting: Cardiology

## 2016-02-17 ENCOUNTER — Encounter: Payer: Self-pay | Admitting: Internal Medicine

## 2016-02-18 NOTE — Telephone Encounter (Signed)
If they can, would recert patient.

## 2016-02-18 NOTE — Telephone Encounter (Signed)
Andre Wilson with [facility] called and stated pts daughter wanted them to extend their length on care with patient. HHRN feels patient does not need home health anymore she feels he is independent enough and has been educated on his condition. [facility]. She just wanted to know if you wanted her to recert patient or if it was ok to d/c patient.

## 2016-02-19 NOTE — Telephone Encounter (Signed)
Left vm for Andre Wilson with [facility] to recert patient asked her to give me a call back if she needed any other information  Andre Wilson call back # [phone]

## 2016-02-23 ENCOUNTER — Other Ambulatory Visit (HOSPITAL_COMMUNITY): Payer: Self-pay | Admitting: Cardiology

## 2016-03-01 ENCOUNTER — Encounter (HOSPITAL_COMMUNITY): Payer: Self-pay

## 2016-03-01 MED ORDER — FUROSEMIDE 40 MG PO TABS: 20.0000 mg | ORAL_TABLET | Freq: Every day | ORAL | Status: DC

## 2016-03-01 NOTE — Progress Notes (Signed)
Toniann FailWendy called to verify patient's medications.  Recently had cath done and a lot of things were added back on list that were recently stopped. Looks like list was populated from home med list and some discontinued things were added back on pre cath that states "patient reports taking". All medications have been verified accordingly with most recent OV notes, lab reports, and phone notes. Advised to return call with any further questions/concerns.  Ave FilterBradley, Megan Genevea

## 2016-03-04 ENCOUNTER — Other Ambulatory Visit (HOSPITAL_COMMUNITY): Payer: Self-pay | Admitting: *Deleted

## 2016-03-04 MED ORDER — LINACLOTIDE 145 MCG PO CAPS: 145.0000 ug | ORAL_CAPSULE | Freq: Every day | ORAL | Status: DC | PRN

## 2016-03-08 ENCOUNTER — Other Ambulatory Visit (HOSPITAL_COMMUNITY): Payer: Self-pay | Admitting: *Deleted

## 2016-03-08 MED ORDER — LINACLOTIDE 145 MCG PO CAPS: 145.0000 ug | ORAL_CAPSULE | Freq: Every day | ORAL | Status: DC | PRN

## 2016-03-09 ENCOUNTER — Ambulatory Visit (INDEPENDENT_AMBULATORY_CARE_PROVIDER_SITE_OTHER): Payer: Medicare Other

## 2016-03-09 ENCOUNTER — Encounter: Payer: Self-pay | Admitting: *Deleted

## 2016-03-09 DIAGNOSIS — I5022 Chronic systolic (congestive) heart failure: Secondary | ICD-10-CM

## 2016-03-09 DIAGNOSIS — Z95810 Presence of automatic (implantable) cardiac defibrillator: Secondary | ICD-10-CM | POA: Diagnosis not present

## 2016-03-09 NOTE — Progress Notes (Signed)
EPIC Encounter for ICM Monitoring  Patient Name: Andre Wilson is a 80 y.o. male Date: 03/09/2016 Primary Care Physican: Abbe AmsterdamOPLAND,JESSICA, MD Primary Cardiologist: Shirlee LatchMcLean Electrophysiologist: Elberta Fortisamnitz Dry Weight: 139 lb      Spoke with daughter Toniann FailWendy [facility] Heart Failure questions reviewed, pt asymptomatic   Thoracic impedence stable.  Recommendations: No changes.    ICM trend: 03/09/2016    Follow-up plan: ICM clinic phone appointment on 04/29/2016.  Copy of ICM check sent to device physician.   Karie SodaLaurie S Short, RN 03/09/2016 12:59 PM

## 2016-03-24 ENCOUNTER — Ambulatory Visit (INDEPENDENT_AMBULATORY_CARE_PROVIDER_SITE_OTHER): Payer: Medicare Other | Admitting: Cardiology

## 2016-03-24 VITALS — BP 110/58 | HR 75 | Ht 69.0 in | Wt 145.2 lb

## 2016-03-24 DIAGNOSIS — I255 Ischemic cardiomyopathy: Secondary | ICD-10-CM

## 2016-03-24 NOTE — Progress Notes (Signed)
Electrophysiology Office Note   Date:  03/24/2016   ID:  Quang Thorpe, DOB July 10, 1936, MRN [id number]  PCP:  Abbe Amsterdam, MD  Cardiologist:  Shirlee Latch Primary Electrophysiologist:  Zamantha Strebel Jorja Loa, MD    Chief Complaint  Patient presents with  . Follow-up    DEFIB CHECK      History of Present Illness: Andre Wilson is a 80 y.o. male who presents today for electrophysiology evaluation.   Hx of CAD, cardiac arrest s/p ICD, CHF, MI, HLD, HTN.  ICD placed 11/2015.  He presented to the hospital on 6/1 with chest pain. He had PCI to his LIMA to LAD lesion with good results. An echo at the time showed an EF of 25-30%, mild AS, and moderate MR.   Today, he denies symptoms of palpitations, chest pain, shortness of breath, orthopnea, PND, lower extremity edema, claudication, dizziness, presyncope, syncope, bleeding, or neurologic sequela. The patient is tolerating medications without difficulties and is otherwise without complaint today. He says that he has been able to do most of his daily activities, only limited by his back pain. He does say that he has a AAA that is being followed in [place] by a Physiological scientist.   Past Medical History:  Diagnosis Date  . Arthritis   . Blood transfusion without reported diagnosis   . CAD (coronary artery disease)   . Cardiac arrest [facility]) secondary to sustained V tach requiring CPR 12/15/15 12/15/2015   s/p St. Jude Ellipse VR (serial Number [id number]) ICD  . Cataract   . CHF (congestive heart failure) (HCC)   . GI bleed   . Glaucoma   . Heart murmur   . Hypercholesteremia   . Hypertension   . MI (myocardial infarction) (HCC)   . Oxygen deficiency   . Sustained VT (ventricular tachycardia) (HCC) 12/15/2015   Past Surgical History:  Procedure Laterality Date  . CARDIAC CATHETERIZATION     CABG 1987  . CARDIAC CATHETERIZATION N/A 12/17/2015   Procedure: Right/Left Heart Cath and Coronary/Graft Angiography;  Surgeon: Lyn Records, MD;   Location: [facility] INVASIVE CV LAB;  Service: Cardiovascular;  Laterality: N/A;  . CARDIAC CATHETERIZATION N/A 01/18/2016   Procedure: Left Heart Cath and Coronary Angiography;  Surgeon: Runell Gess, MD;  Location: [facility] INVASIVE CV LAB;  Service: Cardiovascular;  Laterality: N/A;  . CARDIAC CATHETERIZATION N/A 01/20/2016   Procedure: Coronary Stent Intervention;  Surgeon: Peter M Swaziland, MD;  Location: [facility] INVASIVE CV LAB;  Service: Cardiovascular;  Laterality: N/A;  . CARDIAC CATHETERIZATION N/A 02/12/2016   Procedure: Right Heart Cath;  Surgeon: Laurey Morale, MD;  Location: [facility] INVASIVE CV LAB;  Service: Cardiovascular;  Laterality: N/A;  . EP IMPLANTABLE DEVICE N/A 12/18/2015   Procedure: ICD Implant;  Surgeon: Debroah Shuttleworth Jorja Loa, MD; [facility] [place] VR (serial Number [id number]) ICD   . ULTRASOUND GUIDANCE FOR VASCULAR ACCESS  01/20/2016   Procedure: Ultrasound Guidance For Vascular Access;  Surgeon: Peter M Swaziland, MD;  Location: [facility] INVASIVE CV LAB;  Service: Cardiovascular;;     Current Outpatient Prescriptions  Medication Sig Dispense Refill  . amiodarone (PACERONE) 200 MG tablet Take 1 tablet (200 mg total) by mouth daily. 30 tablet 3  . aspirin EC 81 MG EC tablet Take 1 tablet (81 mg total) by mouth daily. 30 tablet 3  . atorvastatin (LIPITOR) 80 MG tablet Take 1 tablet (80 mg total) by mouth daily. 90 tablet 3  . clopidogrel (PLAVIX) 75 MG tablet Take 75 mg by mouth daily.    Marland Kitchen  digoxin (LANOXIN) 0.125 MG tablet Take 1 tablet (0.125 mg total) by mouth daily. 30 tablet 6  . ezetimibe (ZETIA) 10 MG tablet Take 1 tablet (10 mg total) by mouth daily. 30 tablet 1  . ferrous sulfate 325 (65 FE) MG EC tablet Take 325 mg by mouth 3 (three) times daily with meals.    . folic acid (FOLVITE) 1 MG tablet Take 1 mg by mouth daily.    . furosemide (LASIX) 40 MG tablet Take 0.5 tablets (20 mg total) by mouth daily. 30 tablet 6  . latanoprost (XALATAN) 0.005 % ophthalmic solution Place 1 drop into both  eyes at bedtime.  0  . linaclotide (LINZESS) 145 MCG CAPS capsule Take 1 capsule (145 mcg total) by mouth daily as needed (for constipation). Reported on 01/01/2016 30 capsule 3  . nitroGLYCERIN (NITROSTAT) 0.4 MG SL tablet Place 0.4 mg under the tongue every 5 (five) minutes as needed for chest pain.    Marland Kitchen oxyCODONE-acetaminophen (PERCOCET/ROXICET) 5-325 MG tablet Take 1 tablet by mouth every 6 (six) hours as needed for severe pain. 10 tablet 0  . promethazine (PHENERGAN) 12.5 MG tablet Take 1 tablet (12.5 mg total) by mouth every 6 (six) hours as needed for nausea or vomiting. 30 tablet 0  . ranolazine (RANEXA) 500 MG 12 hr tablet Take 1 tablet (500 mg total) by mouth 2 (two) times daily. 60 tablet 1  . spironolactone (ALDACTONE) 25 MG tablet Take 12.5 mg by mouth daily.    . timolol (TIMOPTIC) 0.5 % ophthalmic solution Place 1 drop into both eyes 2 (two) times daily.  0  . traZODone (DESYREL) 50 MG tablet Take 1 tablet (50 mg total) by mouth at bedtime as needed for sleep. (Patient taking differently: Take 50 mg by mouth at bedtime. ) 90 tablet 3  . vitamin C (ASCORBIC ACID) 500 MG tablet Take 500 mg by mouth 2 (two) times daily.     No current facility-administered medications for this visit.     Allergies:   Review of patient's allergies indicates no known allergies.   Social History:  The patient  reports that he has quit smoking. He does not have any smokeless tobacco history on file. He reports that he does not drink alcohol or use drugs.   Family History:  The patient's family history includes Heart attack in his father; Hyperlipidemia in his mother; Hypertension in his mother.    ROS:  Please see the history of present illness.   Otherwise, review of systems is positive for none.   All other systems are reviewed and negative.    PHYSICAL EXAM: VS:  BP (!) 110/58   Pulse 75   Ht 5\' 9"  (1.753 m)   Wt 145 lb 3.2 oz (65.9 kg)   BMI 21.44 kg/m  , BMI Body mass index is 21.44  kg/m. GEN: Well nourished, well developed, in no acute distress  HEENT: normal  Neck: no JVD, carotid bruits, or masses Cardiac: RRR; no murmurs, rubs, or gallops,no edema  Respiratory:  clear to auscultation bilaterally, normal work of breathing GI: soft, nontender, nondistended, + BS MS: no deformity or atrophy  Skin: warm and dry,  device pocket is well healed Neuro:  Strength and sensation are intact Psych: euthymic mood, full affect  EKG:  EKG is ordered today. Personal review of the ekg ordered shows sinus rhythm, rate 75, inferior/lateral TWI, LAA, nonspecific interventricular conduction delay   Device interrogation is reviewed by me today in detail.  See  PaceArt for details.   Recent Labs: 12/19/2015: Magnesium 2.0 12/26/2015: TSH 3.891 01/18/2016: ALT 30 02/10/2016: B Natriuretic Peptide 648.3; BUN 23; Creatinine, Ser 1.46; Hemoglobin 10.5; Platelets 265; Potassium 5.0; Sodium 132    Lipid Panel     Component Value Date/Time   CHOL 135 01/18/2016 0456   TRIG 58 01/18/2016 0456   HDL 33 (L) 01/18/2016 0456   CHOLHDL 4.1 01/18/2016 0456   VLDL 12 01/18/2016 0456   LDLCALC 90 01/18/2016 0456     Wt Readings from Last 3 Encounters:  03/24/16 145 lb 3.2 oz (65.9 kg)  02/12/16 141 lb (64 kg)  02/10/16 142 lb 9.6 oz (64.7 kg)      Other studies Reviewed: Additional studies/ records that were reviewed today include:  Cardiac cath 01/18/16  Ost LAD lesion, 100% stenosed.  Ost Cx to Prox Cx lesion, 100% stenosed.  Dist Graft to Insertion lesion, between Lat 1st Diag and Dist LAD, 75% stenosed. TTE 01/20/16  - Left ventricle: The cavity size was normal. Systolic function was   severely reduced. The estimated ejection fraction was in the   range of 25% to 30%. Diffuse hypokinesis. There is akinesis of   the anteroseptal myocardium. Doppler parameters are consistent   with a reversible restrictive pattern, indicative of decreased   left ventricular diastolic  compliance and/or increased left   atrial pressure (grade 3 diastolic dysfunction). - Aortic valve: Trileaflet; moderately thickened, moderately   calcified leaflets. There was mild stenosis. There was mild   regurgitation. - Aortic root: The aortic root was normal in size. - Mitral valve: There was moderate regurgitation directed   eccentrically. - Pulmonary arteries: Systolic pressure was mildly increased. PA   peak pressure: 38 mm Hg (S).   ASSESSMENT AND PLAN:  1.  Ischemic cardiomyopathy: ICD placed 11/2015 after an episode of cardiac arrest. His EKG is stable from his prior. His cor vue showed that he was gaining fluid,, but in speaking with him he has not gained any weight. I Kashmere Staffa therefore not adjust his diuretics at this time area   2. Coronary artery disease: s/p CABG and had recent PCI to LIMA-LAD lesion. No further episodes of chest pain we'll continue current management.  3. Hypertension: Well-controlled today no changes made  4. Hyperlipidemia: Currently on a statin    Current medicines are reviewed at length with the patient today.   The patient does not have concerns regarding his medicines.  The following changes were made today:  none  Labs/ tests ordered today include:  No orders of the defined types were placed in this encounter.    Disposition:   FU with Wladyslawa Disbro 9 months  Signed, Kaizley Aja Jorja Loa, MD  03/24/2016 2:07 PM     [facility] [address] [place] [address] [phone] (office) [phone] (fax)

## 2016-03-24 NOTE — Patient Instructions (Signed)
Medication Instructions:    Your physician recommends that you continue on your current medications as directed. Please refer to the Current Medication list given to you today.  --- If you need a refill on your cardiac medications before your next appointment, please call your pharmacy. ---  Labwork:  None ordered  Testing/Procedures:  None ordered  Follow-Up: Remote monitoring is used to monitor your Pacemaker of ICD from home. This monitoring reduces the number of office visits required to check your device to one time per year. It allows [place] to keep an eye on the functioning of your device to ensure it is working properly. You are scheduled for a device check from home on 06/23/2016. You may send your transmission at any time that day. If you have a wireless device, the transmission will be sent automatically. After your physician reviews your transmission, you will receive a postcard with your next transmission date.   Your physician wants you to follow-up in: 9 months with Dr. Elberta Fortis.  You will receive a reminder letter in the mail two months in advance. If you don't receive a letter, please call our office to schedule the follow-up appointment.   Thank you for choosing [facility]!!   Dory Horn, RN [phone]

## 2016-03-26 ENCOUNTER — Encounter: Payer: Self-pay | Admitting: Cardiology

## 2016-03-29 ENCOUNTER — Encounter: Payer: Medicare Other | Admitting: Cardiology

## 2016-03-30 ENCOUNTER — Encounter (INDEPENDENT_AMBULATORY_CARE_PROVIDER_SITE_OTHER): Payer: Self-pay

## 2016-03-30 ENCOUNTER — Encounter: Payer: Self-pay | Admitting: Cardiovascular Disease

## 2016-03-30 ENCOUNTER — Telehealth (HOSPITAL_COMMUNITY): Payer: Self-pay

## 2016-03-30 ENCOUNTER — Ambulatory Visit (INDEPENDENT_AMBULATORY_CARE_PROVIDER_SITE_OTHER): Payer: Medicare Other | Admitting: Cardiovascular Disease

## 2016-03-30 ENCOUNTER — Telehealth (HOSPITAL_COMMUNITY): Payer: Self-pay | Admitting: Pharmacist

## 2016-03-30 VITALS — BP 114/69 | HR 81 | Ht 69.0 in | Wt 147.4 lb

## 2016-03-30 DIAGNOSIS — D689 Coagulation defect, unspecified: Secondary | ICD-10-CM

## 2016-03-30 DIAGNOSIS — Z01818 Encounter for other preprocedural examination: Secondary | ICD-10-CM | POA: Diagnosis not present

## 2016-03-30 DIAGNOSIS — I739 Peripheral vascular disease, unspecified: Secondary | ICD-10-CM | POA: Diagnosis not present

## 2016-03-30 DIAGNOSIS — Z01810 Encounter for preprocedural cardiovascular examination: Secondary | ICD-10-CM

## 2016-03-30 MED ORDER — FOLIC ACID 1 MG PO TABS: 1.0000 mg | ORAL_TABLET | Freq: Every day | ORAL | 0 refills | Status: DC

## 2016-03-30 NOTE — Progress Notes (Signed)
Cardiology Office Note   Date:  03/30/2016   ID:  Andre Wilson, DOB Dec 23, 1935, MRN [id number]  PCP:  Abbe Amsterdam, MD  Cardiologist:  Dr. Shirlee Latch  Chief Complaint  Patient presents with  . New Evaluation    pt c/o numbness in left leg  . PAD      History of Present Illness: Andre Wilson is a 80 y.o. male who Was referred by Dr. Shirlee Latch for evaluation and management of peripheral arterial disease. He has known history of coronary artery disease status post CABG, ischemic cardiomyopathy and history of GI bleed few months ago. He had CABG in 1987. The patient moved from [place] in March. He was hospitalized in April at [facility] for acute on chronic systolic heart failure. He had a VT arrest. Echocardiogram showed an EF of 25-30% with moderate to severe MR. Left heart catheterization showed 3 out of 4 occluded bypass grafts. LIMA to LAD was patent but there was significant anastomosis stenosis. He had successful PCI to LIMA to LAD. He has been tolerating dual antiplatelet therapy. He use to follow-up with a vascular surgeon before he moved here. He was being followed for abdominal aortic aneurysm and peripheral arterial disease with known occluded bilateral SFA. He underwent noninvasive vascular evaluation in May which showed moderately reduced ABI bilaterally. He was noted to have monophasic waveform starting at the common femoral arteries. Duplex showed moderate size aortic aneurysm measuring 4.5 x 3.8 cm. There was significant left external iliac artery stenosis. The patient suffers from significant chronic low back pain over the last few years which has significantly affected his ability to walk. He walks with a cane or a walker. He starts having pain at very short distance. Sometimes he has stiffness in the morning. No significant calf Pain with walking.   Past Medical History:  Diagnosis Date  . Arthritis   . Blood transfusion without reported diagnosis   . CAD (coronary  artery disease)   . Cardiac arrest [facility]) secondary to sustained V tach requiring CPR 12/15/15 12/15/2015   s/p St. Jude Ellipse VR (serial Number [id number]) ICD  . Cataract   . CHF (congestive heart failure) (HCC)   . GI bleed   . Glaucoma   . Heart murmur   . Hypercholesteremia   . Hypertension   . MI (myocardial infarction) (HCC)   . Oxygen deficiency   . Sustained VT (ventricular tachycardia) (HCC) 12/15/2015    Past Surgical History:  Procedure Laterality Date  . CARDIAC CATHETERIZATION     CABG 1987  . CARDIAC CATHETERIZATION N/A 12/17/2015   Procedure: Right/Left Heart Cath and Coronary/Graft Angiography;  Surgeon: Lyn Records, MD;  Location: [facility] INVASIVE CV LAB;  Service: Cardiovascular;  Laterality: N/A;  . CARDIAC CATHETERIZATION N/A 01/18/2016   Procedure: Left Heart Cath and Coronary Angiography;  Surgeon: Runell Gess, MD;  Location: [facility] INVASIVE CV LAB;  Service: Cardiovascular;  Laterality: N/A;  . CARDIAC CATHETERIZATION N/A 01/20/2016   Procedure: Coronary Stent Intervention;  Surgeon: Peter M Swaziland, MD;  Location: [facility] INVASIVE CV LAB;  Service: Cardiovascular;  Laterality: N/A;  . CARDIAC CATHETERIZATION N/A 02/12/2016   Procedure: Right Heart Cath;  Surgeon: Laurey Morale, MD;  Location: [facility] INVASIVE CV LAB;  Service: Cardiovascular;  Laterality: N/A;  . EP IMPLANTABLE DEVICE N/A 12/18/2015   Procedure: ICD Implant;  Surgeon: Will Jorja Loa, MD; [facility] [place] VR (serial Number [id number]) ICD   . ULTRASOUND GUIDANCE FOR VASCULAR ACCESS  01/20/2016  Procedure: Ultrasound Guidance For Vascular Access;  Surgeon: Peter M SwazilandJordan, MD;  [facility] INVASIVE CV LAB;  Service: Cardiovascular;;     Current Outpatient Prescriptions  Medication Sig Dispense Refill  . amiodarone (PACERONE) 200 MG tablet Take 1 tablet (200 mg total) by mouth daily. 30 tablet 3  . aspirin EC 81 MG EC tablet Take 1 tablet (81 mg total) by mouth daily. 30 tablet 3  . atorvastatin (LIPITOR)  80 MG tablet Take 1 tablet (80 mg total) by mouth daily. 90 tablet 3  . clopidogrel (PLAVIX) 75 MG tablet Take 75 mg by mouth daily.    . digoxin (LANOXIN) 0.125 MG tablet Take 1 tablet (0.125 mg total) by mouth daily. 30 tablet 6  . ezetimibe (ZETIA) 10 MG tablet Take 1 tablet (10 mg total) by mouth daily. 30 tablet 1  . ferrous sulfate 325 (65 FE) MG EC tablet Take 325 mg by mouth 3 (three) times daily with meals.    . folic acid (FOLVITE) 1 MG tablet Take 1 mg by mouth daily.    . furosemide (LASIX) 40 MG tablet Take 0.5 tablets (20 mg total) by mouth daily. 30 tablet 6  . latanoprost (XALATAN) 0.005 % ophthalmic solution Place 1 drop into both eyes at bedtime.  0  . linaclotide (LINZESS) 145 MCG CAPS capsule Take 1 capsule (145 mcg total) by mouth daily as needed (for constipation). Reported on 01/01/2016 30 capsule 3  . nitroGLYCERIN (NITROSTAT) 0.4 MG SL tablet Place 0.4 mg under the tongue every 5 (five) minutes as needed for chest pain.    Marland Kitchen. oxyCODONE-acetaminophen (PERCOCET/ROXICET) 5-325 MG tablet Take 1 tablet by mouth every 6 (six) hours as needed for severe pain. 10 tablet 0  . promethazine (PHENERGAN) 12.5 MG tablet Take 1 tablet (12.5 mg total) by mouth every 6 (six) hours as needed for nausea or vomiting. 30 tablet 0  . ranolazine (RANEXA) 500 MG 12 hr tablet Take 1 tablet (500 mg total) by mouth 2 (two) times daily. 60 tablet 1  . spironolactone (ALDACTONE) 25 MG tablet Take 12.5 mg by mouth daily.    . timolol (TIMOPTIC) 0.5 % ophthalmic solution Place 1 drop into both eyes 2 (two) times daily.  0  . traZODone (DESYREL) 50 MG tablet Take 1 tablet (50 mg total) by mouth at bedtime as needed for sleep. (Patient taking differently: Take 50 mg by mouth at bedtime. ) 90 tablet 3  . vitamin C (ASCORBIC ACID) 500 MG tablet Take 500 mg by mouth 2 (two) times daily.     No current facility-administered medications for this visit.     Allergies:   Review of patient's allergies indicates  no known allergies.    Social History:  The patient  reports that he has quit smoking. He does not have any smokeless tobacco history on file. He reports that he does not drink alcohol or use drugs.   Family History:  The patient's family history includes Aneurysm in his brother; Heart attack in his father; Heart disease in his brother and brother; Hyperlipidemia in his mother; Hypertension in his brother, brother, and mother.    ROS:  Please see the history of present illness.   Otherwise, review of systems are positive for none.   All other systems are reviewed and negative.    PHYSICAL EXAM: VS:  BP 114/69 (BP Location: Right Arm, Patient Position: Sitting, Cuff Size: Normal)   Pulse 81   Ht 5\' 9"  (1.753 m)  Wt 147 lb 6.4 oz (66.9 kg)   SpO2 99%   BMI 21.77 kg/m  , BMI Body mass index is 21.77 kg/m. GEN: Well nourished, well developed, in no acute distress  HEENT: normal  Neck: no JVD, carotid bruits, or masses Cardiac: RRR; no rubs, or gallops,no edema . There is 3/6 systolic murmur at the left sternal border  Respiratory:  clear to auscultation bilaterally, normal work of breathing GI: soft, nontender, nondistended, + BS MS: no deformity or atrophy  Skin: warm and dry, no rash Neuro:  Strength and sensation are intact Psych: euthymic mood, full affect Vascular: Femoral pulses are fairly palpable bilaterally. Distal pulses are not palpable.   EKG:  EKG is not ordered today.    Recent Labs: 12/19/2015: Magnesium 2.0 12/26/2015: TSH 3.891 01/18/2016: ALT 30 02/10/2016: B Natriuretic Peptide 648.3; BUN 23; Creatinine, Ser 1.46; Hemoglobin 10.5; Platelets 265; Potassium 5.0; Sodium 132    Lipid Panel    Component Value Date/Time   CHOL 135 01/18/2016 0456   TRIG 58 01/18/2016 0456   HDL 33 (L) 01/18/2016 0456   CHOLHDL 4.1 01/18/2016 0456   VLDL 12 01/18/2016 0456   LDLCALC 90 01/18/2016 0456      Wt Readings from Last 3 Encounters:  03/30/16 147 lb 6.4 oz (66.9  kg)  03/24/16 145 lb 3.2 oz (65.9 kg)  02/12/16 141 lb (64 kg)         ASSESSMENT AND PLAN:  1.  Peripheral arterial disease: The patient is known to have bilateral SFA occlusion but he does not complain of calf claudication. However, I'm concerned that his lower back pain might be due to significant inflow disease given that his femoral pulses are barely palpable. Doppler also showed monophasic waveform in both common femoral arteries. I do suspect that that some of his lower back pain is neuropathic but we have to exclude obstructive inflow disease. Given his significant limitations related to his symptoms, I have recommended proceeding with abdominal aortogram with runoff and possible endovascular intervention. We'll plan to minimize contrast as much as possible given that he has underlying chronic kidney disease. I will also bring him for an early gentle hydration. I discussed risks and benefits. His son-in-law was present.  2. Moderate size abdominal aortic aneurysm: Continue to monitor with early ultrasound.  3. Coronary artery disease involving bypass grafts with other forms of angina: His symptoms seem to be stable.  4. Hyperlipidemia: Continue high-dose simvastatin.  5. Chronic systolic heart failure: He appears to be euvolemic.   Disposition:   FU with me in 1 month  Signed,  Lorine Bears, MD  03/30/2016 10:02 AM    [facility]

## 2016-03-30 NOTE — Patient Instructions (Addendum)
Medication Instructions:  Your physician recommends that you continue on your current medications as directed. Please refer to the Current Medication list given to you today.  The morning of the procedure DO NOT TAKE YOUR FUROSEMIDE.  Testing/Procedures: You will arrive early on Wednesday, Apr 14, 2016 at 05:30 am [facility]. This will be so you can receive hydration through your IV prior to the procedure. You will receive a letter at check-out with additional information.  Dr. Kirke CorinARIDA has ordered a peripheral angiogram(Abdominal aortagram, lower extremity runoff and possible angioplasty) to [facility].  This procedure is going to look at the bloodflow in your lower extremities.  If Dr. Kirke CorinARIDA is able to open up the arteries, you will have to spend one night in the hospital.  If he is not able to open the arteries, you will be able to go home that same day.  SCHEDULE WITH DR ARIDA ON 04/14/16, 2ND OR 3RD CASE.   After the procedure, you will not be allowed to drive for 3 days or push, pull, or lift anything greater than 10 lbs for one week.    You will be required to have the following tests prior to the procedure:  1. Blood work-the blood work can be done no more than 14 days prior to the procedure.  It can be done at any [facility] lab.  There is one downstairs on the first floor of this building and one in the [facility] building [phone]([address], [address])  *REPS  none  Puncture site   If you need a refill on your cardiac medications before your next appointment, please call your pharmacy.

## 2016-03-30 NOTE — Telephone Encounter (Signed)
Patient's daughter called CHF clinic triage line to ask if OTC pain medication ok to take with other medications. Will forward to CHF clinical pharmacist Cicero Duckrika to review to make sure ok.  Ave FilterBradley, Amerah Puleo Genevea, RN

## 2016-03-30 NOTE — Telephone Encounter (Signed)
-----   Message from Chyrl CivatteMegan G Bradley, RN sent at 03/30/2016  2:25 PM EDT ----- Patient's daughter called wanting to know if ok to take some OTC pain meds with current meds

## 2016-03-30 NOTE — Telephone Encounter (Signed)
Spoke with patient's daughter, Toniann FailWendy, who stated that Mr. Cherian is having some back pain for which someone told them that he should try Surveyor, quantityBayer Extra Strength. The active ingredient in this product is aspirin 500 mg. Since he is currently taking aspirin 81 mg and with his history of GI bleeding and heart disease, this is likely not the best option for him. I have recommended using no more than 4000 mg/day of acetaminophen PRN for his back pain. If this does not control his pain he should call his PCP for more potent Rx options. Daughter verbalized understanding and thankful for the information.   Tyler DeisErika K. Bonnye FavaNicolsen, PharmD, BCPS, CPP Clinical Pharmacist Pager: [phone] Phone: [phone] 03/30/2016 4:09 PM

## 2016-04-08 ENCOUNTER — Other Ambulatory Visit: Payer: Self-pay | Admitting: Family Medicine

## 2016-04-08 MED ORDER — RANOLAZINE ER 500 MG PO TB12: 500.0000 mg | ORAL_TABLET | Freq: Two times a day (BID) | ORAL | 5 refills | Status: DC

## 2016-04-08 NOTE — Telephone Encounter (Signed)
Caller name:Bracknell,Jeanie Relation to ZO:XWRUEAVWpt:daughter  Call back number:[phone]  Pharmacy: [facility] [place] - [address] [phone] (Phone) [phone])     Reason for call:  Daughter requesting refill ranolazine (RANEXA) 500 MG 12 hr tablet. Daughter states cardiology prescribed but advised to follow up with PCP for any further refills. Please advise

## 2016-04-08 NOTE — Telephone Encounter (Signed)
Pt also stated he is completely out of his meds. Pt is needing it ASAP. Please advise. Please called pt at Tel: [phone]

## 2016-04-09 LAB — CBC WITH DIFFERENTIAL/PLATELET
BASOS PCT: 0 %
Basophils Absolute: 0 cells/uL (ref 0–200)
EOS ABS: 78 {cells}/uL (ref 15–500)
Eosinophils Relative: 1 %
HCT: 32.9 % — ABNORMAL LOW (ref 38.5–50.0)
Hemoglobin: 10.6 g/dL — ABNORMAL LOW (ref 13.2–17.1)
LYMPHS ABS: 780 {cells}/uL — AB (ref 850–3900)
Lymphocytes Relative: 10 %
MCH: 29.2 pg (ref 27.0–33.0)
MCHC: 32.2 g/dL (ref 32.0–36.0)
MCV: 90.6 fL (ref 80.0–100.0)
MONO ABS: 702 {cells}/uL (ref 200–950)
MONOS PCT: 9 %
MPV: 9.8 fL (ref 7.5–12.5)
NEUTROS ABS: 6240 {cells}/uL (ref 1500–7800)
Neutrophils Relative %: 80 %
PLATELETS: 317 10*3/uL (ref 140–400)
RBC: 3.63 MIL/uL — ABNORMAL LOW (ref 4.20–5.80)
RDW: 15.2 % — AB (ref 11.0–15.0)
WBC: 7.8 10*3/uL (ref 3.8–10.8)

## 2016-04-09 LAB — BASIC METABOLIC PANEL
BUN: 21 mg/dL (ref 7–25)
CHLORIDE: 108 mmol/L (ref 98–110)
CO2: 25 mmol/L (ref 20–31)
CREATININE: 1.4 mg/dL — AB (ref 0.70–1.18)
Calcium: 9.3 mg/dL (ref 8.6–10.3)
Glucose, Bld: 84 mg/dL (ref 65–99)
POTASSIUM: 5.1 mmol/L (ref 3.5–5.3)
SODIUM: 139 mmol/L (ref 135–146)

## 2016-04-10 LAB — APTT: APTT: 27 s (ref 22–34)

## 2016-04-10 LAB — PROTIME-INR
INR: 1
PROTHROMBIN TIME: 11.1 s (ref 9.0–11.5)

## 2016-04-13 ENCOUNTER — Telehealth: Payer: Self-pay | Admitting: Cardiovascular Disease

## 2016-04-13 NOTE — Telephone Encounter (Signed)
°  Follow Up    Pts daughter has some questions regarding an order for a possible imaging. Please call.

## 2016-04-13 NOTE — Telephone Encounter (Signed)
I spoke with Toniann FailWendy and she asked if the pt needs a CXR prior to his procedure tomorrow. I reviewed the pt's chart and he has had this test performed in 2017.  I made her aware that the pt should be okay at this time. If Dr Kirke CorinArida feels this is necessary to repeat then this can be ordered at the hospital.

## 2016-04-14 ENCOUNTER — Ambulatory Visit (HOSPITAL_COMMUNITY)
Admission: RE | Admit: 2016-04-14 | Discharge: 2016-04-14 | Disposition: A | Discharge status: Home / Self Care | Payer: Medicare Other | Source: Ambulatory Visit | Attending: Cardiovascular Disease | Admitting: Cardiovascular Disease

## 2016-04-14 ENCOUNTER — Encounter (HOSPITAL_COMMUNITY)
Admission: RE | Disposition: A | Discharge status: Home / Self Care | Payer: Self-pay | Source: Ambulatory Visit | Attending: Cardiovascular Disease

## 2016-04-14 DIAGNOSIS — M1990 Unspecified osteoarthritis, unspecified site: Secondary | ICD-10-CM | POA: Insufficient documentation

## 2016-04-14 DIAGNOSIS — I70213 Atherosclerosis of native arteries of extremities with intermittent claudication, bilateral legs: Secondary | ICD-10-CM | POA: Insufficient documentation

## 2016-04-14 DIAGNOSIS — Z7902 Long term (current) use of antithrombotics/antiplatelets: Secondary | ICD-10-CM | POA: Insufficient documentation

## 2016-04-14 DIAGNOSIS — I2510 Atherosclerotic heart disease of native coronary artery without angina pectoris: Secondary | ICD-10-CM | POA: Diagnosis not present

## 2016-04-14 DIAGNOSIS — H269 Unspecified cataract: Secondary | ICD-10-CM | POA: Diagnosis not present

## 2016-04-14 DIAGNOSIS — M545 Low back pain: Secondary | ICD-10-CM | POA: Diagnosis not present

## 2016-04-14 DIAGNOSIS — Z951 Presence of aortocoronary bypass graft: Secondary | ICD-10-CM | POA: Diagnosis not present

## 2016-04-14 DIAGNOSIS — N189 Chronic kidney disease, unspecified: Secondary | ICD-10-CM | POA: Insufficient documentation

## 2016-04-14 DIAGNOSIS — I130 Hypertensive heart and chronic kidney disease with heart failure and stage 1 through stage 4 chronic kidney disease, or unspecified chronic kidney disease: Secondary | ICD-10-CM | POA: Insufficient documentation

## 2016-04-14 DIAGNOSIS — I255 Ischemic cardiomyopathy: Secondary | ICD-10-CM | POA: Insufficient documentation

## 2016-04-14 DIAGNOSIS — H409 Unspecified glaucoma: Secondary | ICD-10-CM | POA: Insufficient documentation

## 2016-04-14 DIAGNOSIS — E785 Hyperlipidemia, unspecified: Secondary | ICD-10-CM | POA: Insufficient documentation

## 2016-04-14 DIAGNOSIS — I714 Abdominal aortic aneurysm, without rupture: Secondary | ICD-10-CM | POA: Diagnosis not present

## 2016-04-14 DIAGNOSIS — I739 Peripheral vascular disease, unspecified: Secondary | ICD-10-CM

## 2016-04-14 DIAGNOSIS — R011 Cardiac murmur, unspecified: Secondary | ICD-10-CM | POA: Insufficient documentation

## 2016-04-14 DIAGNOSIS — Z955 Presence of coronary angioplasty implant and graft: Secondary | ICD-10-CM | POA: Diagnosis not present

## 2016-04-14 DIAGNOSIS — I5022 Chronic systolic (congestive) heart failure: Secondary | ICD-10-CM | POA: Insufficient documentation

## 2016-04-14 DIAGNOSIS — Z87891 Personal history of nicotine dependence: Secondary | ICD-10-CM | POA: Diagnosis not present

## 2016-04-14 DIAGNOSIS — Z8249 Family history of ischemic heart disease and other diseases of the circulatory system: Secondary | ICD-10-CM | POA: Diagnosis not present

## 2016-04-14 DIAGNOSIS — Z7982 Long term (current) use of aspirin: Secondary | ICD-10-CM | POA: Insufficient documentation

## 2016-04-14 DIAGNOSIS — E7800 Pure hypercholesterolemia, unspecified: Secondary | ICD-10-CM | POA: Diagnosis not present

## 2016-04-14 DIAGNOSIS — I252 Old myocardial infarction: Secondary | ICD-10-CM | POA: Diagnosis not present

## 2016-04-14 HISTORY — PX: PERIPHERAL VASCULAR CATHETERIZATION: SHX172C

## 2016-04-14 LAB — POCT ACTIVATED CLOTTING TIME
ACTIVATED CLOTTING TIME: 147 s
ACTIVATED CLOTTING TIME: 197 s

## 2016-04-14 SURGERY — ABDOMINAL AORTOGRAM W/LOWER EXTREMITY

## 2016-04-14 MED ORDER — SODIUM CHLORIDE 0.9% FLUSH: 3.0000 mL | Freq: Two times a day (BID) | INTRAVENOUS | Status: DC

## 2016-04-14 MED ORDER — SODIUM CHLORIDE 0.9 % IV SOLN: 250.0000 mL | INTRAVENOUS | Status: DC | PRN

## 2016-04-14 MED ORDER — HEPARIN SODIUM (PORCINE) 1000 UNIT/ML IJ SOLN
INTRAMUSCULAR | Status: AC
  Filled 2016-04-14: qty 1

## 2016-04-14 MED ORDER — MIDAZOLAM HCL 2 MG/2ML IJ SOLN
INTRAMUSCULAR | Status: AC
  Filled 2016-04-14: qty 2

## 2016-04-14 MED ORDER — HEPARIN (PORCINE) IN NACL 2-0.9 UNIT/ML-% IJ SOLN
INTRAMUSCULAR | Status: AC
  Filled 2016-04-14: qty 500

## 2016-04-14 MED ORDER — SODIUM CHLORIDE 0.9 % IV SOLN: INTRAVENOUS | Status: AC

## 2016-04-14 MED ORDER — IODIXANOL 320 MG/ML IV SOLN
INTRAVENOUS | Status: DC | PRN
  Administered 2016-04-14: 70 mL via INTRAVENOUS

## 2016-04-14 MED ORDER — HEPARIN (PORCINE) IN NACL 2-0.9 UNIT/ML-% IJ SOLN
INTRAMUSCULAR | Status: DC | PRN
  Administered 2016-04-14: 1000 mL

## 2016-04-14 MED ORDER — NITROGLYCERIN 1 MG/10 ML FOR IR/CATH LAB
INTRA_ARTERIAL | Status: DC | PRN
  Administered 2016-04-14: 200 ug via INTRA_ARTERIAL

## 2016-04-14 MED ORDER — NITROGLYCERIN 1 MG/10 ML FOR IR/CATH LAB
INTRA_ARTERIAL | Status: AC
  Filled 2016-04-14: qty 10

## 2016-04-14 MED ORDER — FENTANYL CITRATE (PF) 100 MCG/2ML IJ SOLN
INTRAMUSCULAR | Status: DC | PRN
  Administered 2016-04-14: 25 ug via INTRAVENOUS

## 2016-04-14 MED ORDER — ASPIRIN 81 MG PO CHEW: 81.0000 mg | CHEWABLE_TABLET | ORAL | Status: DC

## 2016-04-14 MED ORDER — LIDOCAINE HCL (PF) 1 % IJ SOLN
INTRAMUSCULAR | Status: DC | PRN
  Administered 2016-04-14: 30 mL via INTRADERMAL

## 2016-04-14 MED ORDER — MIDAZOLAM HCL 2 MG/2ML IJ SOLN
INTRAMUSCULAR | Status: DC | PRN
  Administered 2016-04-14: 1 mg via INTRAVENOUS

## 2016-04-14 MED ORDER — SODIUM CHLORIDE 0.9 % IV SOLN
INTRAVENOUS | Status: DC
  Administered 2016-04-14 (×2): via INTRAVENOUS

## 2016-04-14 MED ORDER — HEPARIN SODIUM (PORCINE) 1000 UNIT/ML IJ SOLN
INTRAMUSCULAR | Status: DC | PRN
  Administered 2016-04-14: 2000 [IU] via INTRAVENOUS

## 2016-04-14 MED ORDER — SODIUM CHLORIDE 0.9% FLUSH: 3.0000 mL | INTRAVENOUS | Status: DC | PRN

## 2016-04-14 MED ORDER — LIDOCAINE HCL (PF) 1 % IJ SOLN
INTRAMUSCULAR | Status: AC
  Filled 2016-04-14: qty 30

## 2016-04-14 MED ORDER — FENTANYL CITRATE (PF) 100 MCG/2ML IJ SOLN
INTRAMUSCULAR | Status: AC
  Filled 2016-04-14: qty 2

## 2016-04-14 SURGICAL SUPPLY — 12 items
CATH ANGIO 5F PIGTAIL 100CM (CATHETERS) ×3 IMPLANT
CATH ANGIO 5F PIGTAIL 65CM (CATHETERS) ×3 IMPLANT
KIT MICROINTRODUCER STIFF 5F (SHEATH) ×3 IMPLANT
KIT PV (KITS) ×3 IMPLANT
SHEATH PINNACLE 5F 10CM (SHEATH) ×3 IMPLANT
STOPCOCK MORSE 400PSI 3WAY (MISCELLANEOUS) ×3 IMPLANT
SYRINGE MEDRAD AVANTA MACH 7 (SYRINGE) ×3 IMPLANT
TRANSDUCER W/STOPCOCK (MISCELLANEOUS) ×3 IMPLANT
TRAY PV CATH (CUSTOM PROCEDURE TRAY) ×3 IMPLANT
TUBING CIL FLEX 10 FLL-RA (TUBING) ×3 IMPLANT
WIRE HITORQ VERSACORE ST 145CM (WIRE) ×3 IMPLANT
WIRE TORQFLEX AUST .018X40CM (WIRE) ×12 IMPLANT

## 2016-04-14 NOTE — Interval H&P Note (Signed)
History and Physical Interval Note:  04/14/2016 10:02 AM  Milderd MeagerNelson Pulliam  has presented today for surgery, with the diagnosis of pad  The various methods of treatment have been discussed with the patient and family. After consideration of risks, benefits and other options for treatment, the patient has consented to  Procedure(s): Abdominal Aortogram w/Lower Extremity (N/A) as a surgical intervention .  The patient's history has been reviewed, patient examined, no change in status, stable for surgery.  I have reviewed the patient's chart and labs.  Questions were answered to the patient's satisfaction.     Lorine BearsMuhammad Arida

## 2016-04-14 NOTE — Progress Notes (Addendum)
Site area:Left Brachial Artery  Site Prior to Removal:  Level 0 Pressure Applied For:6730min Manual:   yes Patient Status During Pull:  stable Post Pull Site:  Level 0 Post Pull Instructions Given: yes  Post Pull Pulses Present: palpable radial Dressing Applied:  pressure Bedrest begins @ 1215 till 1815 Comments:

## 2016-04-14 NOTE — H&P (View-Only) (Signed)
Cardiology Office Note   Date:  03/30/2016   ID:  Andre Wilson, DOB Dec 23, 1935, MRN [id number]  PCP:  Abbe Amsterdam, MD  Cardiologist:  Dr. Shirlee Latch  Chief Complaint  Patient presents with  . New Evaluation    pt c/o numbness in left leg  . PAD      History of Present Illness: Andre Wilson is a 80 y.o. male who Was referred by Dr. Shirlee Latch for evaluation and management of peripheral arterial disease. He has known history of coronary artery disease status post CABG, ischemic cardiomyopathy and history of GI bleed few months ago. He had CABG in 1987. The patient moved from [place] in March. He was hospitalized in April at [facility] for acute on chronic systolic heart failure. He had a VT arrest. Echocardiogram showed an EF of 25-30% with moderate to severe MR. Left heart catheterization showed 3 out of 4 occluded bypass grafts. LIMA to LAD was patent but there was significant anastomosis stenosis. He had successful PCI to LIMA to LAD. He has been tolerating dual antiplatelet therapy. He use to follow-up with a vascular surgeon before he moved here. He was being followed for abdominal aortic aneurysm and peripheral arterial disease with known occluded bilateral SFA. He underwent noninvasive vascular evaluation in May which showed moderately reduced ABI bilaterally. He was noted to have monophasic waveform starting at the common femoral arteries. Duplex showed moderate size aortic aneurysm measuring 4.5 x 3.8 cm. There was significant left external iliac artery stenosis. The patient suffers from significant chronic low back pain over the last few years which has significantly affected his ability to walk. He walks with a cane or a walker. He starts having pain at very short distance. Sometimes he has stiffness in the morning. No significant calf Pain with walking.   Past Medical History:  Diagnosis Date  . Arthritis   . Blood transfusion without reported diagnosis   . CAD (coronary  artery disease)   . Cardiac arrest [facility]) secondary to sustained V tach requiring CPR 12/15/15 12/15/2015   s/p [employer] [place] VR (serial Number [id number]) ICD  . Cataract   . CHF (congestive heart failure) (HCC)   . GI bleed   . Glaucoma   . Heart murmur   . Hypercholesteremia   . Hypertension   . MI (myocardial infarction) (HCC)   . Oxygen deficiency   . Sustained VT (ventricular tachycardia) (HCC) 12/15/2015    Past Surgical History:  Procedure Laterality Date  . CARDIAC CATHETERIZATION     CABG 1987  . CARDIAC CATHETERIZATION N/A 12/17/2015   Procedure: Right/Left Heart Cath and Coronary/Graft Angiography;  Surgeon: Lyn Records, MD;  Location: [facility] INVASIVE CV LAB;  Service: Cardiovascular;  Laterality: N/A;  . CARDIAC CATHETERIZATION N/A 01/18/2016   Procedure: Left Heart Cath and Coronary Angiography;  Surgeon: Runell Gess, MD;  Location: [facility] INVASIVE CV LAB;  Service: Cardiovascular;  Laterality: N/A;  . CARDIAC CATHETERIZATION N/A 01/20/2016   Procedure: Coronary Stent Intervention;  Surgeon: Peter M Swaziland, MD;  Location: [facility] INVASIVE CV LAB;  Service: Cardiovascular;  Laterality: N/A;  . CARDIAC CATHETERIZATION N/A 02/12/2016   Procedure: Right Heart Cath;  Surgeon: Laurey Morale, MD;  Location: [facility] INVASIVE CV LAB;  Service: Cardiovascular;  Laterality: N/A;  . EP IMPLANTABLE DEVICE N/A 12/18/2015   Procedure: ICD Implant;  Surgeon: Will Jorja Loa, MD; [facility] [place] VR (serial Number [id number]) ICD   . ULTRASOUND GUIDANCE FOR VASCULAR ACCESS  01/20/2016  Procedure: Ultrasound Guidance For Vascular Access;  Surgeon: Peter M SwazilandJordan, MD;  [facility];  Service: Cardiovascular;;     Current Outpatient Prescriptions  Medication Sig Dispense Refill  . amiodarone (PACERONE) 200 MG tablet Take 1 tablet (200 mg total) by mouth daily. 30 tablet 3  . aspirin EC 81 MG EC tablet Take 1 tablet (81 mg total) by mouth daily. 30 tablet 3  . atorvastatin (LIPITOR)  80 MG tablet Take 1 tablet (80 mg total) by mouth daily. 90 tablet 3  . clopidogrel (PLAVIX) 75 MG tablet Take 75 mg by mouth daily.    . digoxin (LANOXIN) 0.125 MG tablet Take 1 tablet (0.125 mg total) by mouth daily. 30 tablet 6  . ezetimibe (ZETIA) 10 MG tablet Take 1 tablet (10 mg total) by mouth daily. 30 tablet 1  . ferrous sulfate 325 (65 FE) MG EC tablet Take 325 mg by mouth 3 (three) times daily with meals.    . folic acid (FOLVITE) 1 MG tablet Take 1 mg by mouth daily.    . furosemide (LASIX) 40 MG tablet Take 0.5 tablets (20 mg total) by mouth daily. 30 tablet 6  . latanoprost (XALATAN) 0.005 % ophthalmic solution Place 1 drop into both eyes at bedtime.  0  . linaclotide (LINZESS) 145 MCG CAPS capsule Take 1 capsule (145 mcg total) by mouth daily as needed (for constipation). Reported on 01/01/2016 30 capsule 3  . nitroGLYCERIN (NITROSTAT) 0.4 MG SL tablet Place 0.4 mg under the tongue every 5 (five) minutes as needed for chest pain.    Marland Kitchen. oxyCODONE-acetaminophen (PERCOCET/ROXICET) 5-325 MG tablet Take 1 tablet by mouth every 6 (six) hours as needed for severe pain. 10 tablet 0  . promethazine (PHENERGAN) 12.5 MG tablet Take 1 tablet (12.5 mg total) by mouth every 6 (six) hours as needed for nausea or vomiting. 30 tablet 0  . ranolazine (RANEXA) 500 MG 12 hr tablet Take 1 tablet (500 mg total) by mouth 2 (two) times daily. 60 tablet 1  . spironolactone (ALDACTONE) 25 MG tablet Take 12.5 mg by mouth daily.    . timolol (TIMOPTIC) 0.5 % ophthalmic solution Place 1 drop into both eyes 2 (two) times daily.  0  . traZODone (DESYREL) 50 MG tablet Take 1 tablet (50 mg total) by mouth at bedtime as needed for sleep. (Patient taking differently: Take 50 mg by mouth at bedtime. ) 90 tablet 3  . vitamin C (ASCORBIC ACID) 500 MG tablet Take 500 mg by mouth 2 (two) times daily.     No current facility-administered medications for this visit.     Allergies:   Review of patient's allergies indicates  no known allergies.    Social History:  The patient  reports that he has quit smoking. He does not have any smokeless tobacco history on file. He reports that he does not drink alcohol or use drugs.   Family History:  The patient's family history includes Aneurysm in his brother; Heart attack in his father; Heart disease in his brother and brother; Hyperlipidemia in his mother; Hypertension in his brother, brother, and mother.    ROS:  Please see the history of present illness.   Otherwise, review of systems are positive for none.   All other systems are reviewed and negative.    PHYSICAL EXAM: VS:  BP 114/69 (BP Location: Right Arm, Patient Position: Sitting, Cuff Size: Normal)   Pulse 81   Ht 5\' 9"  (1.753 m)  Wt 147 lb 6.4 oz (66.9 kg)   SpO2 99%   BMI 21.77 kg/m  , BMI Body mass index is 21.77 kg/m. GEN: Well nourished, well developed, in no acute distress  HEENT: normal  Neck: no JVD, carotid bruits, or masses Cardiac: RRR; no rubs, or gallops,no edema . There is 3/6 systolic murmur at the left sternal border  Respiratory:  clear to auscultation bilaterally, normal work of breathing GI: soft, nontender, nondistended, + BS MS: no deformity or atrophy  Skin: warm and dry, no rash Neuro:  Strength and sensation are intact Psych: euthymic mood, full affect Vascular: Femoral pulses are fairly palpable bilaterally. Distal pulses are not palpable.   EKG:  EKG is not ordered today.    Recent Labs: 12/19/2015: Magnesium 2.0 12/26/2015: TSH 3.891 01/18/2016: ALT 30 02/10/2016: B Natriuretic Peptide 648.3; BUN 23; Creatinine, Ser 1.46; Hemoglobin 10.5; Platelets 265; Potassium 5.0; Sodium 132    Lipid Panel    Component Value Date/Time   CHOL 135 01/18/2016 0456   TRIG 58 01/18/2016 0456   HDL 33 (L) 01/18/2016 0456   CHOLHDL 4.1 01/18/2016 0456   VLDL 12 01/18/2016 0456   LDLCALC 90 01/18/2016 0456      Wt Readings from Last 3 Encounters:  03/30/16 147 lb 6.4 oz (66.9  kg)  03/24/16 145 lb 3.2 oz (65.9 kg)  02/12/16 141 lb (64 kg)         ASSESSMENT AND PLAN:  1.  Peripheral arterial disease: The patient is known to have bilateral SFA occlusion but he does not complain of calf claudication. However, I'm concerned that his lower back pain might be due to significant inflow disease given that his femoral pulses are barely palpable. Doppler also showed monophasic waveform in both common femoral arteries. I do suspect that that some of his lower back pain is neuropathic but we have to exclude obstructive inflow disease. Given his significant limitations related to his symptoms, I have recommended proceeding with abdominal aortogram with runoff and possible endovascular intervention. We'll plan to minimize contrast as much as possible given that he has underlying chronic kidney disease. I will also bring him for an early gentle hydration. I discussed risks and benefits. His son-in-law was present.  2. Moderate size abdominal aortic aneurysm: Continue to monitor with early ultrasound.  3. Coronary artery disease involving bypass grafts with other forms of angina: His symptoms seem to be stable.  4. Hyperlipidemia: Continue high-dose simvastatin.  5. Chronic systolic heart failure: He appears to be euvolemic.   Disposition:   FU with me in 1 month  Signed,  Lorine BearsMuhammad Jaziah Kwasnik, MD  03/30/2016 10:02 AM    [facility]

## 2016-04-14 NOTE — Discharge Instructions (Signed)
Brachial  Site Care Refer to this sheet in the next few weeks. These instructions provide you with information about caring for yourself after your procedure. Your health care provider may also give you more specific instructions. Your treatment has been planned according to current medical practices, but problems sometimes occur. Call your health care provider if you have any problems or questions after your procedure. WHAT TO EXPECT AFTER THE PROCEDURE After your procedure, it is typical to have the following:  Bruising at the radial site that usually fades within 1-2 weeks.  Blood collecting in the tissue (hematoma) that may be painful to the touch. It should usually decrease in size and tenderness within 1-2 weeks. HOME CARE INSTRUCTIONS  Take medicines only as directed by your health care provider.  You may shower 24-48 hours after the procedure or as directed by your health care provider. Remove the bandage (dressing) and gently wash the site with plain soap and water. Pat the area dry with a clean towel. Do not rub the site, because this may cause bleeding.  Do not take baths, swim, or use a hot tub until your health care provider approves.  Check your insertion site every day for redness, swelling, or drainage.  Do not apply powder or lotion to the site.  Do not flex or bend the affected arm for 24 hours or as directed by your health care provider.  Do not push or pull heavy objects with the affected arm for 24 hours or as directed by your health care provider.  Do not lift over 10 lb (4.5 kg) for 5 days after your procedure or as directed by your health care provider.  Ask your health care provider when it is okay to:  Return to work or school.  Resume usual physical activities or sports.  Resume sexual activity.  Do not drive home if you are discharged the same day as the procedure. Have someone else drive you.  You may drive 24 hours after the procedure unless otherwise  instructed by your health care provider.  Do not operate machinery or power tools for 24 hours after the procedure.  If your procedure was done as an outpatient procedure, which means that you went home the same day as your procedure, a responsible adult should be with you for the first 24 hours after you arrive home.  Keep all follow-up visits as directed by your health care provider. This is important. SEEK MEDICAL CARE IF:  You have a fever.  You have chills.  You have increased bleeding from the radial site. Hold pressure on the site. CALL 911 SEEK IMMEDIATE MEDICAL CARE IF:  You have unusual pain at the radial site.  You have redness, warmth, or swelling at the radial site.  You have drainage (other than a small amount of blood on the dressing) from the radial site.  The radial site is bleeding, and the bleeding does not stop after 30 minutes of holding steady pressure on the site.  Your arm or hand becomes pale, cool, tingly, or numb.   This information is not intended to replace advice given to you by your health care provider. Make sure you discuss any questions you have with your health care provider.   Document Released: 09/11/2010 Document Revised: 08/30/2014 Document Reviewed: 02/25/2014   Elsevier Interactive Patient Education 2016 Elsevier Inc. Angiogram, Care After Refer to this sheet in the next few weeks. These instructions provide you with information about caring for yourself after your  procedure. Your health care provider may also give you more specific instructions. Your treatment has been planned according to current medical practices, but problems sometimes occur. Call your health care provider if you have any problems or questions after your procedure. WHAT TO EXPECT AFTER THE PROCEDURE After your procedure, it is typical to have the following:  Bruising at the catheter insertion site that usually fades within 1-2 weeks.  Blood collecting in the tissue  (hematoma) that may be painful to the touch. It should usually decrease in size and tenderness within 1-2 weeks. HOME CARE INSTRUCTIONS  Take medicines only as directed by your health care provider.  You may shower 24-48 hours after the procedure or as directed by your health care provider. Remove the bandage (dressing) and gently wash the site with plain soap and water. Pat the area dry with a clean towel. Do not rub the site, because this may cause bleeding.  Do not take baths, swim, or use a hot tub until your health care provider approves.  Check your insertion site every day for redness, swelling, or drainage.  Do not apply powder or lotion to the site.  Do not lift over 10 lb (4.5 kg) for 5 days after your procedure or as directed by your health care provider.  Ask your health care provider when it is okay to:  Return to work or school.  Resume usual physical activities or sports.  Resume sexual activity.  Do not drive home if you are discharged the same day as the procedure. Have someone else drive you.  You may drive 24 hours after the procedure unless otherwise instructed by your health care provider.  Do not operate machinery or power tools for 24 hours after the procedure or as directed by your health care provider.  If your procedure was done as an outpatient procedure, which means that you went home the same day as your procedure, a responsible adult should be with you for the first 24 hours after you arrive home.  Keep all follow-up visits as directed by your health care provider. This is important. SEEK MEDICAL CARE IF:  You have a fever.  You have chills.  You have increased bleeding from the catheter insertion site. Hold pressure on the site. CALL 911 SEEK IMMEDIATE MEDICAL CARE IF:  You have unusual pain at the catheter insertion site.  You have redness, warmth, or swelling at the catheter insertion site.  You have drainage (other than a small amount of  blood on the dressing) from the catheter insertion site.  The catheter insertion site is bleeding, and the bleeding does not stop after 30 minutes of holding steady pressure on the site.  The area near or just beyond the catheter insertion site becomes pale, cool, tingly, or numb.   This information is not intended to replace advice given to you by your health care provider. Make sure you discuss any questions you have with your health care provider.   Document Released: 02/25/2005 Document Revised: 08/30/2014 Document Reviewed: 01/10/2013 Elsevier Interactive Patient Education [employer]2016 Elsevier Inc.

## 2016-04-15 ENCOUNTER — Encounter (HOSPITAL_COMMUNITY): Payer: Self-pay | Admitting: Cardiovascular Disease

## 2016-04-16 ENCOUNTER — Telehealth (HOSPITAL_COMMUNITY): Payer: Self-pay | Admitting: *Deleted

## 2016-04-16 NOTE — Telephone Encounter (Signed)
I left voice message stating that it is ok for patient to take clairitin for allergies.   *patient left voice message asking if it was ok to take clairitin for allergies*

## 2016-04-19 ENCOUNTER — Encounter (HOSPITAL_COMMUNITY): Payer: Self-pay | Admitting: Cardiovascular Disease

## 2016-04-29 ENCOUNTER — Ambulatory Visit (INDEPENDENT_AMBULATORY_CARE_PROVIDER_SITE_OTHER): Payer: Medicare Other

## 2016-04-29 ENCOUNTER — Telehealth: Payer: Self-pay

## 2016-04-29 DIAGNOSIS — I5022 Chronic systolic (congestive) heart failure: Secondary | ICD-10-CM | POA: Diagnosis not present

## 2016-04-29 DIAGNOSIS — Z95810 Presence of automatic (implantable) cardiac defibrillator: Secondary | ICD-10-CM

## 2016-04-29 NOTE — Progress Notes (Signed)
EPIC Encounter for ICM Monitoring  Patient Name: Andre Wilson is a 80 y.o. male Date: 04/29/2016 Primary Care Physican: Abbe AmsterdamOPLAND,JESSICA, MD Primary Cardiologist: Shirlee LatchMcLean Electrophysiologist: Elberta Fortisamnitz Dry Weight: unknown        Attempted ICM call to daughter , Roseanna RainbowWendy Hooker, [facility]([place]PR) and unable to reach.  Transmission reviewed.   Thoracic impedance normal. Impedance was decreased 03/14/2016 to 04/06/2016  Follow-up plan: ICM clinic phone appointment on 06/02/2016.  Copy of ICM check sent to device physician.   ICM trend: 04/29/2016            Karie SodaLaurie S Michelle Wnek, RN 04/29/2016 3:53 PM

## 2016-04-29 NOTE — Telephone Encounter (Signed)
Remote ICM transmission received.  Attempted call to daughter, Rose[place] RainbowWendy [place]er, [place] and left message for return call.

## 2016-05-07 ENCOUNTER — Other Ambulatory Visit (HOSPITAL_COMMUNITY): Payer: Self-pay | Admitting: Cardiology

## 2016-05-11 ENCOUNTER — Encounter: Payer: Self-pay | Admitting: *Deleted

## 2016-05-11 ENCOUNTER — Ambulatory Visit: Payer: Medicare Other | Admitting: Cardiovascular Disease

## 2016-05-20 ENCOUNTER — Other Ambulatory Visit (HOSPITAL_COMMUNITY): Payer: Self-pay | Admitting: Cardiology

## 2016-06-01 ENCOUNTER — Encounter: Payer: Self-pay | Admitting: Cardiovascular Disease

## 2016-06-01 ENCOUNTER — Ambulatory Visit (INDEPENDENT_AMBULATORY_CARE_PROVIDER_SITE_OTHER): Payer: Medicare Other | Admitting: Cardiovascular Disease

## 2016-06-01 VITALS — BP 110/60 | HR 75 | Ht 69.0 in | Wt 144.2 lb

## 2016-06-01 DIAGNOSIS — I739 Peripheral vascular disease, unspecified: Secondary | ICD-10-CM

## 2016-06-01 DIAGNOSIS — I2510 Atherosclerotic heart disease of native coronary artery without angina pectoris: Secondary | ICD-10-CM | POA: Diagnosis not present

## 2016-06-01 DIAGNOSIS — I10 Essential (primary) hypertension: Secondary | ICD-10-CM

## 2016-06-01 DIAGNOSIS — E7800 Pure hypercholesterolemia, unspecified: Secondary | ICD-10-CM | POA: Diagnosis not present

## 2016-06-01 NOTE — Progress Notes (Signed)
Cardiology Office Note   Date:  06/01/2016   ID:  Andre Wilson, DOB [birth year], MRN [id number]  PCP:  Abbe Amsterdam, MD  Cardiologist:  Dr. Shirlee Latch  No chief complaint on file.     History of Present Illness: Andre Wilson is a 80 y.o. male who is here today for a follow up visit regarding peripheral arterial disease. He has known history of coronary artery disease status post CABG, ischemic cardiomyopathy and history of GI bleed few months ago. He had CABG in 1987. The patient moved from [place] in March. He was hospitalized in April at [facility] for acute on chronic systolic heart failure. He had a VT arrest. Echocardiogram showed an EF of 25-30% with moderate to severe MR. Left heart catheterization showed 3 out of 4 occluded bypass grafts. LIMA to LAD was patent but there was significant anastomosis stenosis. He had successful PCI to LIMA to LAD. He has been tolerating dual antiplatelet therapy.  He has known history of abdominal aortic aneurysm and peripheral arterial disease and was seen by different vascular surgeons before he moved to this area. He is known to have bilateral SFA occlusions for years.  He underwent noninvasive vascular evaluation in May which showed moderately reduced ABI bilaterally. Duplex showed moderate size aortic aneurysm measuring 4.5 x 3.8 cm. I proceeded with angiography but he was found to have occluded bilateral common femoral arteries and could not obtain access from the femoral approach. Angiography was performed via the left brachial artery and showed significant bilateral common iliac artery disease, heavily calcified and occluded bilateral common femoral arteries as well as heavily calcified and occluded bilateral SFAs.  The patient's biggest problem seems to be lower back pain which has been chronic. It is typically exertional but the distance is variable. It can also happen at rest and with certain movements. He has no significant calf  claudication. He uses a wheelchair but can walk with a cane or a walker.     Past Medical History:  Diagnosis Date  . Arthritis   . Blood transfusion without reported diagnosis   . CAD (coronary artery disease)   . Cardiac arrest [facility]) secondary to sustained V tach requiring CPR 12/15/15 12/15/2015   s/p [employer] VR (serial Number [id number]) ICD  . Cataract   . CHF (congestive heart failure) (HCC)   . GI bleed   . Glaucoma   . Heart murmur   . Hypercholesteremia   . Hypertension   . MI (myocardial infarction)   . Oxygen deficiency   . Sustained VT (ventricular tachycardia) (HCC) 12/15/2015    Past Surgical History:  Procedure Laterality Date  . CARDIAC CATHETERIZATION     CABG 1987  . CARDIAC CATHETERIZATION N/A 12/17/2015   Procedure: Right/Left Heart Cath and Coronary/Graft Angiography;  Surgeon: Lyn Records, MD;  Location: [facility] INVASIVE CV LAB;  Service: Cardiovascular;  Laterality: N/A;  . CARDIAC CATHETERIZATION N/A 01/18/2016   Procedure: Left Heart Cath and Coronary Angiography;  Surgeon: Runell Gess, MD;  Location: [facility] INVASIVE CV LAB;  Service: Cardiovascular;  Laterality: N/A;  . CARDIAC CATHETERIZATION N/A 01/20/2016   Procedure: Coronary Stent Intervention;  Surgeon: Peter M Swaziland, MD;  Location: [facility] INVASIVE CV LAB;  Service: Cardiovascular;  Laterality: N/A;  . CARDIAC CATHETERIZATION N/A 02/12/2016   Procedure: Right Heart Cath;  Surgeon: Laurey Morale, MD;  Location: [facility] INVASIVE CV LAB;  Service: Cardiovascular;  Laterality: N/A;  . EP IMPLANTABLE DEVICE N/A 12/18/2015   Procedure:  ICD Implant;  Surgeon: Will Jorja Loa, MD; [facility] [place] VR (serial Number [id number]) ICD   . PERIPHERAL VASCULAR CATHETERIZATION N/A 04/14/2016   Procedure: Abdominal Aortogram w/Lower Extremity;  Surgeon: Iran Ouch, MD;  Location: [facility] INVASIVE CV LAB;  Service: Cardiovascular;  Laterality: N/A;  . ULTRASOUND GUIDANCE FOR VASCULAR ACCESS  01/20/2016   Procedure:  Ultrasound Guidance For Vascular Access;  Surgeon: Peter M Swaziland, MD;  Location: [facility] INVASIVE CV LAB;  Service: Cardiovascular;;     Current Outpatient Prescriptions  Medication Sig Dispense Refill  . amiodarone (PACERONE) 200 MG tablet Take 1 tablet (200 mg total) by mouth daily. 30 tablet 3  . aspirin EC 81 MG EC tablet Take 1 tablet (81 mg total) by mouth daily. 30 tablet 3  . atorvastatin (LIPITOR) 80 MG tablet Take 1 tablet (80 mg total) by mouth daily. 90 tablet 3  . clopidogrel (PLAVIX) 75 MG tablet Take 75 mg by mouth daily.    . digoxin (LANOXIN) 0.125 MG tablet Take 1 tablet (0.125 mg total) by mouth daily. 30 tablet 6  . ezetimibe (ZETIA) 10 MG tablet Take 1 tablet (10 mg total) by mouth daily. 30 tablet 1  . ferrous sulfate 325 (65 FE) MG EC tablet Take 325 mg by mouth 3 (three) times daily with meals.    . folic acid (FOLVITE) 1 MG tablet take 1 tablet by mouth daily 30 tablet 3  . furosemide (LASIX) 40 MG tablet Take 0.5 tablets (20 mg total) by mouth daily. 30 tablet 6  . latanoprost (XALATAN) 0.005 % ophthalmic solution Place 1 drop into both eyes at bedtime.  0  . linaclotide (LINZESS) 145 MCG CAPS capsule Take 1 capsule (145 mcg total) by mouth daily as needed (for constipation). Reported on 01/01/2016 30 capsule 3  . nitroGLYCERIN (NITROSTAT) 0.4 MG SL tablet Place 0.4 mg under the tongue every 5 (five) minutes as needed for chest pain.    Marland Kitchen oxyCODONE-acetaminophen (PERCOCET/ROXICET) 5-325 MG tablet Take 1 tablet by mouth every 6 (six) hours as needed for severe pain. 10 tablet 0  . promethazine (PHENERGAN) 12.5 MG tablet Take 1 tablet (12.5 mg total) by mouth every 6 (six) hours as needed for nausea or vomiting. 30 tablet 0  . ranolazine (RANEXA) 500 MG 12 hr tablet Take 1 tablet (500 mg total) by mouth 2 (two) times daily. 60 tablet 5  . spironolactone (ALDACTONE) 25 MG tablet take 1/2 tablet by mouth every morning 15 tablet 1  . timolol (TIMOPTIC) 0.5 % ophthalmic solution  Place 1 drop into both eyes 2 (two) times daily.  0  . traZODone (DESYREL) 50 MG tablet Take 1 tablet (50 mg total) by mouth at bedtime as needed for sleep. 90 tablet 3  . vitamin C (ASCORBIC ACID) 500 MG tablet Take 500 mg by mouth 2 (two) times daily.     No current facility-administered medications for this visit.     Allergies:   Review of patient's allergies indicates no known allergies.    Social History:  The patient  reports that he has quit smoking. He does not have any smokeless tobacco history on file. He reports that he does not drink alcohol or use drugs.   Family History:  The patient's family history includes Aneurysm in his brother; Heart attack in his father; Heart disease in his brother and brother; Hyperlipidemia in his mother; Hypertension in his brother, brother, and mother.    ROS:  Please see the history of present  illness.   Otherwise, review of systems are positive for none.   All other systems are reviewed and negative.    PHYSICAL EXAM: VS:  BP 110/60   Pulse 75   Ht 5\' 9"  (1.753 m)   Wt 144 lb 3.2 oz (65.4 kg)   BMI 21.29 kg/m  , BMI Body mass index is 21.29 kg/m. GEN: Well nourished, well developed, in no acute distress  HEENT: normal  Neck: no JVD, carotid bruits, or masses Cardiac: RRR; no rubs, or gallops,no edema . There is 3/6 systolic murmur at the left sternal border  Respiratory:  clear to auscultation bilaterally, normal work of breathing GI: soft, nontender, nondistended, + BS MS: no deformity or atrophy  Skin: warm and dry, no rash Neuro:  Strength and sensation are intact Psych: euthymic mood, full affect Vascular:  Distal pulses are not palpable. Left brachial pulse is normal with no hematoma   EKG:  EKG is not ordered today.    Recent Labs: 12/19/2015: Magnesium 2.0 12/26/2015: TSH 3.891 01/18/2016: ALT 30 02/10/2016: B Natriuretic Peptide 648.3 04/09/2016: BUN 21; Creat 1.40; Hemoglobin 10.6; Platelets 317; Potassium 5.1; Sodium 139     Lipid Panel    Component Value Date/Time   CHOL 135 01/18/2016 0456   TRIG 58 01/18/2016 0456   HDL 33 (L) 01/18/2016 0456   CHOLHDL 4.1 01/18/2016 0456   VLDL 12 01/18/2016 0456   LDLCALC 90 01/18/2016 0456      Wt Readings from Last 3 Encounters:  06/01/16 144 lb 3.2 oz (65.4 kg)  04/14/16 146 lb (66.2 kg)  03/30/16 147 lb 6.4 oz (66.9 kg)         ASSESSMENT AND PLAN:  1.  Peripheral arterial disease:  The patient has extensive peripheral arterial disease with moderate size aortic aneurysm, significant bilateral common iliac artery disease and occluded bilateral common femoral arteries and SFAs. His biggest complaint seems to be lower back pain but he has no significant calf claudication likely due to limited mobility. It's not certain that his lower back pain is due to peripheral arterial disease and might be related to spine issue. In that situation, revascularization would not make any difference in his situation. I also doubt that he is going to be a candidate for aortobifemoral bypass with common femoral artery endarterectomy.  I asked him to follow-up with his primary care physician and consider spine imaging to clarify the etiology of his lower back pain before considering any kind of high risk revascularization for his peripheral arterial disease.  2. Moderate size abdominal aortic aneurysm: Continue to monitowith yearly ultrasound   3. Coronary artery disease involving bypass grafts with other forms of angina: His symptoms seem to be stable.  4. Hyperlipidemia: Continue high-dose simvastatin.  5. Chronic systolic heart failure: He appears to be euvolemic.   Disposition:   FU with me in 3 month  Signed,  Lorine BearsMuhammad Dawit Tankard, MD  06/01/2016 9:48 AM    [facility]

## 2016-06-01 NOTE — Patient Instructions (Signed)
Medication Instructions:  Your physician recommends that you continue on your current medications as directed. Please refer to the Current Medication list given to you today.  Labwork: No new orders.   Testing/Procedures: No new orders.   Follow-Up: Your physician recommends that you schedule a follow-up appointment in: 3 MONTHS with Dr Arida   Any Other Special Instructions Will Be Listed Below (If Applicable).     If you need a refill on your cardiac medications before your next appointment, please call your pharmacy.   

## 2016-06-02 ENCOUNTER — Ambulatory Visit (INDEPENDENT_AMBULATORY_CARE_PROVIDER_SITE_OTHER): Payer: Medicare Other

## 2016-06-02 ENCOUNTER — Telehealth (HOSPITAL_COMMUNITY): Payer: Self-pay | Admitting: Cardiology

## 2016-06-02 DIAGNOSIS — Z95810 Presence of automatic (implantable) cardiac defibrillator: Secondary | ICD-10-CM | POA: Diagnosis not present

## 2016-06-02 DIAGNOSIS — I5022 Chronic systolic (congestive) heart failure: Secondary | ICD-10-CM | POA: Diagnosis not present

## 2016-06-02 NOTE — Telephone Encounter (Signed)
Message sent to chf scheduler to arrange follow up

## 2016-06-02 NOTE — Telephone Encounter (Signed)
-----   Message from Laurey Moralealton S McLean, MD sent at 06/01/2016  9:40 PM EDT ----- Herbert SetaHeather, make sure this patient has followup with me.  ----- Message ----- From: Iran OuchMuhammad A Arida, MD Sent: 06/01/2016  10:18 AM To: Laurey Moralealton S McLean, MD

## 2016-06-04 NOTE — Progress Notes (Signed)
EPIC Encounter for ICM Monitoring  Patient Name: Andre Wilson is a 80 y.o. male Date: 06/04/2016 Primary Care Physican: Abbe AmsterdamOPLAND,JESSICA, MD Primary Cardiologist:McLean Electrophysiologist: Camnitz Dry Weight:    140 lbs       Spoke with daughter, Roseanna RainbowWendy Hooker [facility]  Heart Failure questions reviewed, pt asymptomatic   Thoracic impedance normal.  Recommendations: No changes.  Advised to limit salt intake to 2000 mg daily.  Encouraged to call for fluid symptoms.    Follow-up plan: ICM clinic phone appointment on 07/05/2016.  Copy of ICM check sent to primary cardiologist and device physician.   ICM trend: 06/02/2016       Karie SodaLaurie S Durrell Barajas, RN 06/04/2016 8:47 AM

## 2016-06-05 ENCOUNTER — Encounter (HOSPITAL_BASED_OUTPATIENT_CLINIC_OR_DEPARTMENT_OTHER): Payer: Self-pay | Admitting: Emergency Medicine

## 2016-06-05 ENCOUNTER — Emergency Department (HOSPITAL_BASED_OUTPATIENT_CLINIC_OR_DEPARTMENT_OTHER)
Admission: EM | Admit: 2016-06-05 | Discharge: 2016-06-05 | Disposition: A | Discharge status: Home / Self Care | Payer: Medicare Other | Attending: Emergency Medicine | Admitting: Emergency Medicine

## 2016-06-05 DIAGNOSIS — Z7982 Long term (current) use of aspirin: Secondary | ICD-10-CM | POA: Insufficient documentation

## 2016-06-05 DIAGNOSIS — Z79899 Other long term (current) drug therapy: Secondary | ICD-10-CM | POA: Insufficient documentation

## 2016-06-05 DIAGNOSIS — L02811 Cutaneous abscess of head [any part, except face]: Secondary | ICD-10-CM | POA: Insufficient documentation

## 2016-06-05 DIAGNOSIS — I2510 Atherosclerotic heart disease of native coronary artery without angina pectoris: Secondary | ICD-10-CM | POA: Insufficient documentation

## 2016-06-05 DIAGNOSIS — I110 Hypertensive heart disease with heart failure: Secondary | ICD-10-CM | POA: Insufficient documentation

## 2016-06-05 DIAGNOSIS — I5022 Chronic systolic (congestive) heart failure: Secondary | ICD-10-CM | POA: Diagnosis not present

## 2016-06-05 DIAGNOSIS — Z87891 Personal history of nicotine dependence: Secondary | ICD-10-CM | POA: Diagnosis not present

## 2016-06-05 DIAGNOSIS — L0291 Cutaneous abscess, unspecified: Secondary | ICD-10-CM

## 2016-06-05 MED ORDER — CLINDAMYCIN HCL 150 MG PO CAPS: 450.0000 mg | ORAL_CAPSULE | Freq: Three times a day (TID) | ORAL | 0 refills | Status: DC

## 2016-06-05 MED ORDER — ACETAMINOPHEN 500 MG PO TABS
1000.0000 mg | ORAL_TABLET | Freq: Once | ORAL | Status: AC
  Administered 2016-06-05: 1000 mg via ORAL
  Filled 2016-06-05: qty 2

## 2016-06-05 MED ORDER — OXYCODONE HCL 5 MG PO TABS
5.0000 mg | ORAL_TABLET | Freq: Once | ORAL | Status: AC
  Administered 2016-06-05: 5 mg via ORAL
  Filled 2016-06-05: qty 1

## 2016-06-05 MED ORDER — LIDOCAINE-EPINEPHRINE (PF) 2 %-1:200000 IJ SOLN
10.0000 mL | Freq: Once | INTRAMUSCULAR | Status: AC
  Administered 2016-06-05: 10 mL
  Filled 2016-06-05: qty 10

## 2016-06-05 NOTE — ED Provider Notes (Signed)
[facility] Provider Note   CSN: [id number] Arrival date & time: 06/05/16  1511  By signing my name below, I, Rosario Adie, attest that this documentation has been prepared under the direction and in the presence of Melene Plan, DO. Electronically Signed: Rosario Adie, ED Scribe. 06/05/16. 4:03 PM.  History   Chief Complaint Chief Complaint  Patient presents with  . Recurrent Skin Infections   The history is provided by the patient. No language interpreter was used.  Abscess  Location:  Head/neck Head/neck abscess location:  Head Abscess quality: painful   Red streaking: no   Progression:  Worsening Pain details:    Timing:  Intermittent   Progression:  Worsening Chronicity:  Recurrent Context: not diabetes and not immunosuppression   Relieved by:  Nothing Worsened by:  Nothing Ineffective treatments:  Warm compresses Associated symptoms: no fever, no headaches and no vomiting   Risk factors: prior abscess    HPI Comments: Andre Wilson is a 80 y.o. male with a PMHx of CHF, who presents to the Emergency Department complaining of a reccurent, gradually worsening area of pain and swelling between the right eye and nose onset PTA. Family at besides notes that this area has been recurrent over the past twelve years; however, this is the worst that that area has ever been. He has been applying warm compresses with minimal relief of his symptoms. Pt states pain is exacerbated with palpation and direct pressure. No h/o DM. Denies fever, chills, eye pain, HA or any other associated symptoms.    Past Medical History:  Diagnosis Date  . Arthritis   . Blood transfusion without reported diagnosis   . CAD (coronary artery disease)   . Cardiac arrest [facility]) secondary to sustained V tach requiring CPR 12/15/15 12/15/2015   s/p [employer] VR (serial Number [id number]) ICD  . Cataract   . CHF (congestive heart failure) (HCC)   . GI bleed   . Glaucoma   .  Heart murmur   . Hypercholesteremia   . Hypertension   . MI (myocardial infarction)   . Oxygen deficiency   . Sustained VT (ventricular tachycardia) (HCC) 12/15/2015   Patient Active Problem List   Diagnosis Date Noted  . Fatigue 01/27/2016  . ICD (implantable cardioverter-defibrillator) in place 01/27/2016  . ST elevation (STEMI) myocardial infarction involving left anterior descending coronary artery (HCC) 01/19/2016  . STEMI (ST elevation myocardial infarction) (HCC) 01/18/2016  . ST elevation myocardial infarction involving left anterior descending (LAD) coronary artery (HCC)   . Chronic systolic CHF (congestive heart failure) (HCC) 01/13/2016  . Chest pain   . Moderate to severe aortic stenosis 12/16/2015  . Mitral regurgitation and aortic stenosis 12/16/2015  . Cardiomyopathy, ischemic 12/16/2015  . Essential hypertension   . Sustained VT (ventricular tachycardia) (HCC) 12/15/2015  . Cardiac arrest [facility]) secondary to sustained V tach requiring CPR 12/15/15 12/15/2015  . Acute on chronic congestive heart failure (HCC)   . Coronary artery disease involving autologous vein bypass graft   . Acute combined systolic and diastolic heart failure (HCC) - unsure chronicity 12/13/2015  . Coronary artery disease involving native heart with angina pectoris (HCC) 12/13/2015  . Protein-calorie malnutrition, moderate (HCC) 12/13/2015  . Elevated troponin 12/13/2015  . GI bleed   . Hypertension   . Hypercholesteremia    Past Surgical History:  Procedure Laterality Date  . CARDIAC CATHETERIZATION     CABG 1987  . CARDIAC CATHETERIZATION N/A 12/17/2015   Procedure: Right/Left Heart  Cath and Coronary/Graft Angiography;  Surgeon: Lyn Records, MD;  Location: [facility] INVASIVE CV LAB;  Service: Cardiovascular;  Laterality: N/A;  . CARDIAC CATHETERIZATION N/A 01/18/2016   Procedure: Left Heart Cath and Coronary Angiography;  Surgeon: Runell Gess, MD;  Location: [facility] INVASIVE CV LAB;  Service:  Cardiovascular;  Laterality: N/A;  . CARDIAC CATHETERIZATION N/A 01/20/2016   Procedure: Coronary Stent Intervention;  Surgeon: Peter M Swaziland, MD;  Location: [facility] INVASIVE CV LAB;  Service: Cardiovascular;  Laterality: N/A;  . CARDIAC CATHETERIZATION N/A 02/12/2016   Procedure: Right Heart Cath;  Surgeon: Laurey Morale, MD;  Location: [facility] INVASIVE CV LAB;  Service: Cardiovascular;  Laterality: N/A;  . EP IMPLANTABLE DEVICE N/A 12/18/2015   Procedure: ICD Implant;  Surgeon: Will Jorja Loa, MD; [facility] VR (serial Number [id number]) ICD   . PERIPHERAL VASCULAR CATHETERIZATION N/A 04/14/2016   Procedure: Abdominal Aortogram w/Lower Extremity;  Surgeon: Iran Ouch, MD;  Location: [facility] INVASIVE CV LAB;  Service: Cardiovascular;  Laterality: N/A;  . ULTRASOUND GUIDANCE FOR VASCULAR ACCESS  01/20/2016   Procedure: Ultrasound Guidance For Vascular Access;  Surgeon: Peter M Swaziland, MD;  Location: [facility] INVASIVE CV LAB;  Service: Cardiovascular;;    Home Medications    Prior to Admission medications   Medication Sig Start Date End Date Taking? Authorizing Provider  amiodarone (PACERONE) 200 MG tablet Take 1 tablet (200 mg total) by mouth daily. 12/26/15   Laurey Morale, MD  aspirin EC 81 MG EC tablet Take 1 tablet (81 mg total) by mouth daily. 12/20/15   Ripudeep Jenna Luo, MD  atorvastatin (LIPITOR) 80 MG tablet Take 1 tablet (80 mg total) by mouth daily. 02/12/16   Laurey Morale, MD  clindamycin (CLEOCIN) 150 MG capsule Take 3 capsules (450 mg total) by mouth 3 (three) times daily. X 7 days 06/05/16 06/12/16  Melene Plan, DO  clopidogrel (PLAVIX) 75 MG tablet Take 75 mg by mouth daily.    Historical Provider, MD  digoxin (LANOXIN) 0.125 MG tablet Take 1 tablet (0.125 mg total) by mouth daily. 02/11/16   Amy D Filbert Schilder, NP  ezetimibe (ZETIA) 10 MG tablet Take 1 tablet (10 mg total) by mouth daily. 01/05/16   Gwenlyn Found Copland, MD  ferrous sulfate 325 (65 FE) MG EC tablet Take 325 mg by mouth 3 (three)  times daily with meals.    Historical Provider, MD  folic acid (FOLVITE) 1 MG tablet take 1 tablet by mouth daily 05/20/16   Laurey Morale, MD  furosemide (LASIX) 40 MG tablet Take 0.5 tablets (20 mg total) by mouth daily. 03/01/16   Laurey Morale, MD  latanoprost (XALATAN) 0.005 % ophthalmic solution Place 1 drop into both eyes at bedtime. 12/02/15   Historical Provider, MD  linaclotide (LINZESS) 145 MCG CAPS capsule Take 1 capsule (145 mcg total) by mouth daily as needed (for constipation). Reported on 01/01/2016 03/08/16   Laurey Morale, MD  nitroGLYCERIN (NITROSTAT) 0.4 MG SL tablet Place 0.4 mg under the tongue every 5 (five) minutes as needed for chest pain.    Historical Provider, MD  oxyCODONE-acetaminophen (PERCOCET/ROXICET) 5-325 MG tablet Take 1 tablet by mouth every 6 (six) hours as needed for severe pain. 12/20/15   Ripudeep Jenna Luo, MD  promethazine (PHENERGAN) 12.5 MG tablet Take 1 tablet (12.5 mg total) by mouth every 6 (six) hours as needed for nausea or vomiting. 12/20/15   Ripudeep Jenna Luo, MD  ranolazine (RANEXA) 500 MG 12 hr tablet  Take 1 tablet (500 mg total) by mouth 2 (two) times daily. 04/08/16   Pearline CablesJessica C Copland, MD  spironolactone (ALDACTONE) 25 MG tablet take 1/2 tablet by mouth every morning 05/07/16   Laurey Moralealton S McLean, MD  timolol (TIMOPTIC) 0.5 % ophthalmic solution Place 1 drop into both eyes 2 (two) times daily. 12/02/15   Historical Provider, MD  traZODone (DESYREL) 50 MG tablet Take 1 tablet (50 mg total) by mouth at bedtime as needed for sleep. 01/14/16   Leone BrandLaura R Ingold, NP  vitamin C (ASCORBIC ACID) 500 MG tablet Take 500 mg by mouth 2 (two) times daily.    Historical Provider, MD   Family History Family History  Problem Relation Age of Onset  . Hypertension Mother   . Hyperlipidemia Mother   . Heart attack Father   . Aneurysm Brother   . Heart disease Brother   . Hypertension Brother   . Heart disease Brother   . Hypertension Brother    Social History Social  History  Substance Use Topics  . Smoking status: Former Games developermoker  . Smokeless tobacco: Never Used  . Alcohol use No   Allergies   Review of patient's allergies indicates no known allergies.  Review of Systems Review of Systems  Constitutional: Negative for chills and fever.  HENT: Negative for congestion and facial swelling.   Eyes: Negative for pain, discharge and visual disturbance.  Respiratory: Negative for shortness of breath.   Cardiovascular: Negative for chest pain and palpitations.  Gastrointestinal: Negative for abdominal pain, diarrhea and vomiting.  Musculoskeletal: Negative for arthralgias and myalgias.  Skin: Negative for color change and rash.  Neurological: Negative for tremors, syncope and headaches.  Psychiatric/Behavioral: Negative for confusion and dysphoric mood.  All other systems reviewed and are negative.  Physical Exam Updated Vital Signs BP 128/70 (BP Location: Right Arm)   Pulse 76   Temp 98.2 F (36.8 C) (Oral)   Resp 20   Ht 5\' 9"  (1.753 m)   Wt 145 lb (65.8 kg)   SpO2 96%   BMI 21.41 kg/m   Physical Exam  Constitutional: He is oriented to person, place, and time. He appears well-developed and well-nourished.  HENT:  Head: Normocephalic and atraumatic.  Fluctuant area to the left of the bridge of the nose. Pustule on the area. No noted intranasal lesions or tracks. No necrosis.   Eyes: EOM are normal. Pupils are equal, round, and reactive to light.  No pain with EOM.   Neck: Normal range of motion. Neck supple. No JVD present.  Cardiovascular: Normal rate and regular rhythm.  Exam reveals no gallop and no friction rub.   No murmur heard. Pulmonary/Chest: No respiratory distress. He has no wheezes.  Abdominal: He exhibits no distension. There is no rebound and no guarding.  Musculoskeletal: Normal range of motion.  Neurological: He is alert and oriented to person, place, and time.  Skin: No rash noted. No pallor.  Psychiatric: He has a  normal mood and affect. His behavior is normal.  Nursing note and vitals reviewed.  ED Treatments / Results  DIAGNOSTIC STUDIES: Oxygen Saturation is 100% on RA, normal by my interpretation.   COORDINATION OF CARE: 3:44 PM-Discussed next steps with pt. Pt verbalized understanding and is agreeable with the plan.   Labs (all labs ordered are listed, but only abnormal results are displayed) Labs Reviewed - No data to display  Radiology No results found.  Procedures .Marland Kitchen.Incision and Drainage Date/Time: 06/05/2016 4:54 PM Performed by: Adela LankFLOYD, Emeli Goguen  Authorized by: Melene Plan   Consent:    Consent obtained:  Verbal   Consent given by:  Patient   Risks discussed:  Incomplete drainage, pain and infection   Alternatives discussed:  Alternative treatment Universal protocol:    Procedure explained and questions answered to patient or proxy's satisfaction: yes     Relevant documents present and verified: yes     Test results available and properly labeled: yes     Imaging studies available: yes     Required blood products, implants, devices, and special equipment available: yes     Site/side marked: yes     Immediately prior to procedure a time out was called: yes     Patient identity confirmed:  Verbally with patient Location:    Type:  Abscess   Size:  Quater sized   Location:  Head   Head location:  Nose Pre-procedure details:    Skin preparation:  Chloraprep Sedation:    Sedation type: none. Anesthesia (see MAR for exact dosages):    Anesthesia method:  Local infiltration   Local anesthetic:  Lidocaine 2% WITH epi Procedure type:    Complexity:  Complex Procedure details:    Needle aspiration: no     Incision types:  Stab incision and single straight   Incision depth:  Submucosal   Scalpel blade:  11   Wound management:  Probed and deloculated and extensive cleaning   Drainage:  Purulent and bloody   Drainage amount:  Copious   Wound treatment:  Wound left open    Packing materials:  None Post-procedure details:    Patient tolerance of procedure:  Tolerated well, no immediate complications    Medications Ordered in ED Medications  lidocaine-EPINEPHrine (XYLOCAINE W/EPI) 2 %-1:200000 (PF) injection 10 mL (10 mLs Infiltration Given 06/05/16 1558)  acetaminophen (TYLENOL) tablet 1,000 mg (1,000 mg Oral Given 06/05/16 1557)  oxyCODONE (Oxy IR/ROXICODONE) immediate release tablet 5 mg (5 mg Oral Given 06/05/16 1557)   Initial Impression / Assessment and Plan / ED Course  I have reviewed the triage vital signs and the nursing notes.  Pertinent labs & imaging results that were available during my care of the patient were reviewed by me and considered in my medical decision making (see chart for details).  Clinical Course   80 yo M With a chief complaint of an abscess. Patient has had an abscess in this location off and on for at least 20 years. Denies fevers or chills. Normally squeezes on this and has pus coming out of it. I&D performed at bedside. With recurrence in location will send to ENT.  5:49 PM:  I have discussed the diagnosis/risks/treatment options with the patient and family and believe the pt to be eligible for discharge home to follow-up with ENT. We also discussed returning to the ED immediately if new or worsening sx occur. We discussed the sx which are most concerning (e.g., sudden worsening pain, fever, inability to tolerate by mouth) that necessitate immediate return. Medications administered to the patient during their visit and any new prescriptions provided to the patient are listed below.  Medications given during this visit Medications  lidocaine-EPINEPHrine (XYLOCAINE W/EPI) 2 %-1:200000 (PF) injection 10 mL (10 mLs Infiltration Given 06/05/16 1558)  acetaminophen (TYLENOL) tablet 1,000 mg (1,000 mg Oral Given 06/05/16 1557)  oxyCODONE (Oxy IR/ROXICODONE) immediate release tablet 5 mg (5 mg Oral Given 06/05/16 1557)     The  patient appears reasonably screen and/or stabilized for discharge and I doubt any  other medical condition or other [facility] requiring further screening, evaluation, or treatment in the ED at this time prior to discharge.    Final Clinical Impressions(s) / ED Diagnoses   Final diagnoses:  Abscess   New Prescriptions Discharge Medication List as of 06/05/2016  5:00 PM    START taking these medications   Details  clindamycin (CLEOCIN) 150 MG capsule Take 3 capsules (450 mg total) by mouth 3 (three) times daily. X 7 days, Starting Sat 06/05/2016, Until Sat 06/12/2016, Print       I personally performed the services described in this documentation, which was scribed in my presence. The recorded information has been reviewed and is accurate.     Melene Plan, DO 06/05/16 1749

## 2016-06-05 NOTE — ED Triage Notes (Signed)
Pt has swollen area that he says is filled with puss between his L eye and the bridge of his nose. Pt states it has been recurring for years and normally he is able to drain in. Pt states it is larger and more painful this time.

## 2016-06-05 NOTE — ED Notes (Signed)
Pt and family given d/c instructions as per chart. Verbalize understanding. No questions. Rx x 1

## 2016-06-09 ENCOUNTER — Other Ambulatory Visit (HOSPITAL_COMMUNITY): Payer: Self-pay | Admitting: Cardiology

## 2016-06-09 NOTE — Telephone Encounter (Signed)
Medication  lisinopril (PRINIVIL,ZESTRIL) 2.5 MG tablet [13089]  lisinopril (PRINIVIL,ZESTRIL) 2.5 MG tablet [[id number]][[id number]] DISCONTINUED  Order Details  Dose: 2.5 mg Route: Oral Frequency: Every evening  Dispense Quantity:  30 tablet Refills:  3 Fills remaining:  --        Sig: Take 1 tablet (2.5 mg total) by mouth every evening.       Discontinue Date:  03/01/2016 1413 Discontinue User:  Chyrl CivatteMegan G Bradley, RN Discontinue Reason:  Discontinued by provider  Written Date:  01/12/16 Expiration Date:  01/11/17    Start Date:  01/12/16 End Date:  03/01/16         Ordering Provider:  Laurey Moralealton S McLean, MD DEA #:  [id number] NPI:  [id number]   Authorizing Provider:  Laurey Moralealton S McLean, MD DEA #:  [id number] NPI:  [id number]   Ordering User:  Noralee SpaceHeather M Schub, RN            [id number] [place] [address] DEA #:  [id number]

## 2016-06-10 ENCOUNTER — Ambulatory Visit (HOSPITAL_COMMUNITY)
Admission: RE | Admit: 2016-06-10 | Discharge: 2016-06-10 | Disposition: A | Discharge status: Home / Self Care | Payer: Medicare Other | Source: Ambulatory Visit | Attending: Cardiology | Admitting: Cardiology

## 2016-06-10 ENCOUNTER — Encounter (HOSPITAL_COMMUNITY): Payer: Self-pay

## 2016-06-10 VITALS — BP 117/55 | HR 67 | Wt 146.0 lb

## 2016-06-10 DIAGNOSIS — D649 Anemia, unspecified: Secondary | ICD-10-CM | POA: Insufficient documentation

## 2016-06-10 DIAGNOSIS — M545 Low back pain: Secondary | ICD-10-CM | POA: Diagnosis not present

## 2016-06-10 DIAGNOSIS — I340 Nonrheumatic mitral (valve) insufficiency: Secondary | ICD-10-CM | POA: Insufficient documentation

## 2016-06-10 DIAGNOSIS — I25119 Atherosclerotic heart disease of native coronary artery with unspecified angina pectoris: Secondary | ICD-10-CM | POA: Diagnosis not present

## 2016-06-10 DIAGNOSIS — Z9889 Other specified postprocedural states: Secondary | ICD-10-CM | POA: Insufficient documentation

## 2016-06-10 DIAGNOSIS — E7800 Pure hypercholesterolemia, unspecified: Secondary | ICD-10-CM | POA: Diagnosis not present

## 2016-06-10 DIAGNOSIS — Z951 Presence of aortocoronary bypass graft: Secondary | ICD-10-CM | POA: Diagnosis not present

## 2016-06-10 DIAGNOSIS — I739 Peripheral vascular disease, unspecified: Secondary | ICD-10-CM | POA: Diagnosis not present

## 2016-06-10 DIAGNOSIS — I350 Nonrheumatic aortic (valve) stenosis: Secondary | ICD-10-CM | POA: Insufficient documentation

## 2016-06-10 DIAGNOSIS — I255 Ischemic cardiomyopathy: Secondary | ICD-10-CM | POA: Diagnosis not present

## 2016-06-10 DIAGNOSIS — I5022 Chronic systolic (congestive) heart failure: Secondary | ICD-10-CM | POA: Insufficient documentation

## 2016-06-10 DIAGNOSIS — I714 Abdominal aortic aneurysm, without rupture: Secondary | ICD-10-CM | POA: Diagnosis not present

## 2016-06-10 DIAGNOSIS — I252 Old myocardial infarction: Secondary | ICD-10-CM | POA: Diagnosis not present

## 2016-06-10 DIAGNOSIS — I2510 Atherosclerotic heart disease of native coronary artery without angina pectoris: Secondary | ICD-10-CM | POA: Diagnosis not present

## 2016-06-10 DIAGNOSIS — Z95810 Presence of automatic (implantable) cardiac defibrillator: Secondary | ICD-10-CM | POA: Diagnosis not present

## 2016-06-10 LAB — LIPID PANEL
CHOL/HDL RATIO: 3.7 ratio
Cholesterol: 149 mg/dL (ref 0–200)
HDL: 40 mg/dL — AB (ref >40)
LDL Cholesterol: 99 mg/dL (ref 0–99)
Triglycerides: 52 mg/dL (ref <150)
VLDL: 10 mg/dL (ref 0–40)

## 2016-06-10 LAB — COMPREHENSIVE METABOLIC PANEL
ALBUMIN: 4.1 g/dL (ref 3.5–5.0)
ALT: 22 U/L (ref 17–63)
AST: 25 U/L (ref 15–41)
Alkaline Phosphatase: 54 U/L (ref 38–126)
Anion gap: 7 (ref 5–15)
BUN: 22 mg/dL — AB (ref 6–20)
CHLORIDE: 103 mmol/L (ref 101–111)
CO2: 27 mmol/L (ref 22–32)
Calcium: 9.8 mg/dL (ref 8.9–10.3)
Creatinine, Ser: 1.54 mg/dL — ABNORMAL HIGH (ref 0.61–1.24)
GFR calc Af Amer: 48 mL/min — ABNORMAL LOW (ref >60)
GFR calc non Af Amer: 42 mL/min — ABNORMAL LOW (ref >60)
GLUCOSE: 90 mg/dL (ref 65–99)
POTASSIUM: 5.2 mmol/L — AB (ref 3.5–5.1)
SODIUM: 137 mmol/L (ref 135–145)
Total Bilirubin: 0.7 mg/dL (ref 0.3–1.2)
Total Protein: 7.6 g/dL (ref 6.5–8.1)

## 2016-06-10 LAB — DIGOXIN LEVEL: DIGOXIN LVL: 1.5 ng/mL (ref 0.8–2.0)

## 2016-06-10 LAB — TSH: TSH: 1.912 u[IU]/mL (ref 0.350–4.500)

## 2016-06-10 MED ORDER — ISOSORB DINITRATE-HYDRALAZINE 20-37.5 MG PO TABS: 0.5000 | ORAL_TABLET | Freq: Three times a day (TID) | ORAL | 3 refills | Status: DC

## 2016-06-10 NOTE — Progress Notes (Signed)
CSW referred to assist patient with multiple unpaid medical bills. Patient and daughter report the bills are adding up but unclear if all bills have been processed through his insurance. Patient working in education for many years and reports he has insurance and a pension. Patient also has social security benefits and Medicare. CSW provided information on [employer] program and discussed follow up with [facility] billing. Patient and daughter verbalizes understanding of follow up and will return call to CSW if needed. Lasandra BeechJackie Even Budlong, LCSW [phone]

## 2016-06-10 NOTE — Patient Instructions (Signed)
Start Bidil 1/2 tab Three times a day   Labs today  Your physician recommends that you schedule a follow-up appointment in: 6 weeks

## 2016-06-11 ENCOUNTER — Telehealth (HOSPITAL_COMMUNITY): Payer: Self-pay | Admitting: Pharmacist

## 2016-06-11 ENCOUNTER — Telehealth (HOSPITAL_COMMUNITY): Payer: Self-pay | Admitting: Cardiology

## 2016-06-11 ENCOUNTER — Ambulatory Visit (HOSPITAL_COMMUNITY): Payer: Self-pay | Admitting: Pharmacist

## 2016-06-11 DIAGNOSIS — E7800 Pure hypercholesterolemia, unspecified: Secondary | ICD-10-CM

## 2016-06-11 MED ORDER — ROSUVASTATIN CALCIUM 40 MG PO TABS: 40.0000 mg | ORAL_TABLET | Freq: Every day | ORAL | 3 refills | Status: AC

## 2016-06-11 NOTE — Progress Notes (Signed)
error 

## 2016-06-11 NOTE — Telephone Encounter (Signed)
Pt aware via daughter. New rx sent to pharmacy. Recheck labs x 2 months

## 2016-06-11 NOTE — Telephone Encounter (Signed)
Bidil does not require a PA through his [employer] Part D but his copay is $38 this month 2/2 being in the coverage gap. Enrolled in PAN foundation to help cover this cost. Info relayed to [facility] on [address] who verified $0 copay.   Billing ID: [id number] Person Code: [id number] RX Group: [id number] RX BIN: [id number] PCN for Part D: [id number]   Tyler DeisErika K. Bonnye FavaNicolsen, PharmD, BCPS, CPP Clinical Pharmacist Pager: [phone] Phone: [phone] 06/11/2016 3:32 PM

## 2016-06-11 NOTE — Progress Notes (Signed)
Patient ID: Andre Wilson, male   DOB: 05/11/1936, 80 y.o.   MRN: [id number]   PCP: Dr. Arthor Captain Cardiology: Dr. Shirlee Latch  80 yo with history of CAD s/p CABG, ischemic cardiomyopathy, and recent GI bleed presents for cardiology followup after hospitalization.  Patient has been followed in the past in [name].  He had CABG back in 1987.  He had an admission in [place] in 3/17 with GI bleeding.  Per patient and daughter, bleeding was severe but he did not have endoscopy.  He is back on ASA 81 and Plavix.    He was admitted to [facility] in 4/17 with acute on chronic systolic CHF.  After hospitalization, he had a VT arrest.  Echo showed EF 25-30% with moderately decreased RV function, probably moderate AS, and mod-severe MR.  LHC was done, showing 3/4 occluded bypass grafts.  LIMA-LAD was patent but had 70-75% anastomotic stenosis.  He was medically managed. RHC showed near-normal filling pressures.  St Jude ICD was placed.   Admitted 01/18/16 with anterior STEMI, TnI to 45.  Repeat LHC showed similar anatomy to 4/17 LHC with diffusely diseased RCA, totally occluded LCx and LAD, and 75% anastomotic lesion at LIMA touchdown. Touchdown lesion was likely the culprit. His last 2 admissions were likely ischemia-related, VT arrest in 4/17 and anterior STEMI in 5/17. He had successful PCI to the LIMA-LAD anastomotic lesion on 01/20/16. Discharge weight was 140 pounds.  RHC in 6/17 showed preserved cardiac output and normal filling pressures.   He had peripheral angiography in 8/17 showing occluded bilateral SFAs and significant bilateral CIA disease.  No good percutaneous options, and probably a poor candidate for aortobifemoral bypass.   Stable symptomatically.  No pain in legs during the day, does have calf pain at night.  More limited by low back pain than anything else.  Not doing much walking.  Able to walk around house without dyspnea.  No chest pain.    Corevue was reviewed today, stable  thoracic impedance.   ECG: NSR, LVH with repolarization abnormality, QRS 132 msec.   Labs (4/17): K 4.5, creatinine 1.42, LDL 120, HCT 31.7 Labs (5/17): K 4.3, creatinine 1.49, BNP 645, TSH normal, HCT 38.8, LFTs normal Labs (01/27/2016):  K 5.2, Creatinine 1.68, BNP 688, Hgb 10.9  Labs (8/17): K 5.1, creatinine 1.4, hgb 10.6  PMH: 1. CAD: S/p CABG 1987.  - LHC (4/17) with total occlusion of vein grafts x 3.  LIMA-LAD patent with 70-75% anastomotic stenosis.  Total occlusion of the LAD, LCx, and diffuse disease throughout the RCA.  He was managed medically.  - Admission 5/17 with anterior STEMI, troponin to 45.  LHC in 5/17 showed similar anatomy to 4/17 with diffusely diseased RCA, totally occluded LCx and LAD, and 75% anastomotic lesion at LIMA touchdown. Touchdown lesion was likely the culprit. He had successful PCI to the LIMA-LAD anastomotic lesion on 01/20/16. 2. Chronic systolic CHF: Ischemic cardiomyopathy.  St Jude ICD.  - Echo (4/17): EF 25-30%, inferolateral akinesis, moderately decreased RV systolic function, ?moderate aortic stenosis but mean gradient 10 mmHg, moderate to severe MR.  - RHC (4/17): mean RA 2, PA 42/14, mean PCWP 15, CI 2.09. - Echo (5/17): EF 25-30%, anteroseptal AK, mild AS, mild AI, moderate MR, PASP 38 mmHg.  - RHC (6/17): mean RA 1, PA 41/14 mean 24, mean PCWP 10, CI 2.33, PVR 3.3 WU.  3. History of GI bleeding: 3/17.  Per report, bleeding was severe, but he did not have endoscopy done.  4. PAD: 11/16 cath showed occluded bilateral SFAs.   - Lower extremity dopplers (5/17) with bilateral SFA occlusions, > 50% left external iliac stenosis.  - PV angiography in 8/17 with totally occluded bilateral SFAs, significant bilateral CIA disease => medical treatment.  5. VT: 4/17 admit with VT, loss of consciousness.  6. Hyperlipidemia 7. Aortic stenosis: Probably moderate on 4/17 echo, mild on 5/17 echo.  8. Mitral regurgitation: Moderate to severe on 4/17 echo,  suspect infarct-related.  Moderate on 5/17 echo.  9. AAA: Abdominal US 5/17 with 4.5 cm AAA.  10. 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  Nonsmoker (since college years), no ETOH.   FH: CAD  ROS: All systems reviewed and negative except as per HPI.   Current Outpatient Prescriptions  Medication Sig Dispense Refill  . amiodarone (PACERONE) 200 MG tablet Take 1 tablet (200 mg total) by mouth daily. 30 tablet 3  . aspirin EC 81 MG EC tablet Take 1 tablet (81 mg total) by mouth daily. 30 tablet 3  . digoxin (LANOXIN) 0.125 MG tablet Take 1 tablet (0.125 mg total) by mouth daily. 30 tablet 6  . ezetimibe (ZETIA) 10 MG tablet Take 1 tablet (10 mg total) by mouth daily. 30 tablet 1  . ferrous sulfate 325 (65 FE) MG EC tablet Take 325 mg by mouth 3 (three) times daily with meals.    . folic acid (FOLVITE) 1 MG tablet take 1 tablet by mouth daily 30 tablet 3  . furosemide (LASIX) 40 MG tablet Take 0.5 tablets (20 mg total) by mouth daily. 30 tablet 6  . latanoprost (XALATAN) 0.005 % ophthalmic solution Place 1 drop into both eyes at bedtime.  0  . linaclotide (LINZESS) 145 MCG CAPS capsule Take 1 capsule (145 mcg total) by mouth daily as needed (for constipation). Reported on 01/01/2016 30 capsule 3  . promethazine (PHENERGAN) 12.5 MG tablet Take 1 tablet (12.5 mg total) by mouth every 6 (six) hours as needed for nausea or vomiting. 30 tablet 0  . ranolazine (RANEXA) 500 MG 12 hr tablet Take 1 tablet (500 mg total) by mouth 2 (two) times daily. 60 tablet 5  . spironolactone (ALDACTONE) 25 MG tablet take 1/2 tablet by mouth every morning 15 tablet 1  . timolol (TIMOPTIC) 0.5 % ophthalmic solution Place 1 drop into both eyes 2 (two) times daily.  0  . traZODone (DESYREL) 50 MG tablet Take 1 tablet (50 mg total) by mouth at bedtime as needed for sleep. 90 tablet 3  . vitamin C (ASCORBIC ACID) 500 MG tablet Take 500 mg by mouth 2 (two) times daily.      . isosorbide-hydrALAZINE (BIDIL) 20-37.5 MG tablet Take 0.5 tablets by mouth 3 (three) times daily. 45 tablet 3  . nitroGLYCERIN (NITROSTAT) 0.4 MG SL tablet Place 0.4 mg under the tongue every 5 (five) minutes as needed for chest pain.    Marland Kitchen. oxyCODONE-acetaminophen (PERCOCET/ROXICET) 5-325 MG tablet Take 1 tablet by mouth every 6 (six) hours as needed for severe pain. (Patient not taking: Reported on 06/10/2016) 10 tablet 0  . rosuvastatin (CRESTOR) 40 MG tablet Take 1 tablet (40 mg total) by mouth daily. 90 tablet 3   No current facility-administered medications for this encounter.    BP (!) 117/55   Pulse 67   Wt 146 lb (66.2 kg)   SpO2 100%   BMI 21.56 kg/m    General: NAD, thin Daughter present. Arrive in a wheel  chair.  Neck: No JVD, no thyromegaly or thyroid nodule.  Lungs: Clear to auscultation bilaterally with normal respiratory effort. CV: Nondisplaced PMI.  Heart regular S1/S2, no S3/S4, 2/6 SEM RUSB with clear S2.  No peripheral edema.  No carotid bruit.  Unable to palpate pedal pulses.  Abdomen: Soft, nontender, no hepatosplenomegaly, no distention.  Skin: Intact without lesions or rashes.  Neurologic: Alert and oriented x 3.  Psych: Normal affect. Extremities: No clubbing or cyanosis.  HEENT: Normal.   Assessment/Plan: 1. CAD: s/p CABG 1987.  4/17 cath showed all SVGs occluded.  The LIMA-LAD had a 70-75% anastomotic lesion.  Readmitted with anterior STEMI in 5/17.  Repeat cath showed similar anatomy to 4/17 cath.  Had PCI 01/20/16 to LIMA-LAD anastomotic lesion.  No further chest pain.  - Continue ASA 81 and Plavix.  - On high dose atorvastatin, check lipids today.   - He can continue Ranexa.  2. Chronic systolic CHF: Ischemic cardiomyopathy.  EF 20-25% on 4/17 echo, 25-30% on 5/17 echo. Has St Jude ICD.  Corevue was ok today. He is not volume overloaded on exam. NYHA III symptoms, primarily due to severe fatigue.  More limited by low back pain than dyspnea.    - Off  coreg due to profound fatigue.  - Continue digoxin, check level today.     - Add Bidil 1/2 tab tid.  Would hold off on ACEI for now with borderline elevated K and CKD stage III.   - Continue spironolactone 12.5 mg qam but check BMET today.  3. PAD: Leg weakness is a major complaint.  Peripheral angiography by Dr Kirke Corin in 8/17, report above.  Plan medical management as he does not have clear claudication and aortobifemoral bypass would be high risk. 4. VT: Likely scar-mediated.  He is now on amiodarone.  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  - Continue amiodarone 200 mg daily.  Check TSH and LFTs today.  Will need regular eye exams with amiodarone use.  5. Anemia: GI bleeding of uncertain etiology in 3/17.  CBC today.  6. Aortic stenosis: Appeared mild on last echo.  7. Mitral regurgitation: Moderate on last echo, likely infarct-related.  8. AAA: 4.5 cm in 5/17.  Repeat abdominal US in 11/17.   Marca Ancona 06/11/2016

## 2016-06-11 NOTE — Telephone Encounter (Signed)
-----   Message from Laurey Moralealton S McLean, MD sent at 06/10/2016  4:23 PM EDT ----- K is borderline elevated.  Follow low K diet.  LDL is too high on atorvastatin.  Would stop atorvastatin and start Crestor 40 mg daily.  Lipids/LFTs in 2 months.

## 2016-06-15 ENCOUNTER — Telehealth: Payer: Self-pay

## 2016-06-15 NOTE — Telephone Encounter (Signed)
Spoke to patient's daughter regarding Heart Failure Studies; patient is by the phone. Agrees to appointment 10/31 at 11am for screening.

## 2016-06-22 ENCOUNTER — Other Ambulatory Visit (HOSPITAL_COMMUNITY): Payer: Self-pay | Admitting: Cardiology

## 2016-06-25 ENCOUNTER — Encounter: Payer: Self-pay | Admitting: Cardiology

## 2016-06-28 ENCOUNTER — Encounter: Payer: Self-pay | Admitting: Internal Medicine

## 2016-07-05 ENCOUNTER — Ambulatory Visit (INDEPENDENT_AMBULATORY_CARE_PROVIDER_SITE_OTHER): Payer: Medicare Other | Admitting: *Deleted

## 2016-07-05 DIAGNOSIS — Z95810 Presence of automatic (implantable) cardiac defibrillator: Secondary | ICD-10-CM

## 2016-07-05 DIAGNOSIS — I5022 Chronic systolic (congestive) heart failure: Secondary | ICD-10-CM

## 2016-07-05 DIAGNOSIS — I255 Ischemic cardiomyopathy: Secondary | ICD-10-CM

## 2016-07-05 NOTE — Progress Notes (Signed)
Remote ICD transmission.   

## 2016-07-07 NOTE — Progress Notes (Signed)
EPIC Encounter for ICM Monitoring  Patient Name: Milderd Meagerelson Deanda is a 80 y.o. male Date: 07/07/2016 Primary Care Physican: Abbe AmsterdamOPLAND,JESSICA, MD Primary Cardiologist:McLean Electrophysiologist: Elberta Fortisamnitz Dry Weight:unknown                                                      Attempted call to daughter, Roseanna RainbowWendy Hooker [facility](DPR).  Left detailed message.  Transmission reviewed   Thoracic impedance normal since 06/29/2016.    Was abnormal suggesting fluid accumulation 06/16/2016 to 06/29/2016.  Follow-up plan: ICM clinic phone appointment on 08/09/2016.  Copy of ICM check sent to device physician.   ICM trend: 07/05/2016       Karie SodaLaurie S Annebelle Bostic, RN 07/07/2016 12:14 PM

## 2016-07-17 LAB — CUP PACEART REMOTE DEVICE CHECK
Battery Remaining Longevity: 97 mo
Battery Remaining Percentage: 93 %
Brady Statistic RV Percent Paced: 1 %
Date Time Interrogation Session: 20171113070016
HIGH POWER IMPEDANCE MEASURED VALUE: 63 Ohm
HIGH POWER IMPEDANCE MEASURED VALUE: 63 Ohm
Implantable Lead Implant Date: 20170427
Implantable Pulse Generator Implant Date: 20170427
Lead Channel Impedance Value: 480 Ohm
Lead Channel Pacing Threshold Amplitude: 0.75 V
Lead Channel Pacing Threshold Pulse Width: 0.4 ms
MDC IDC LEAD LOCATION: 753860
MDC IDC MSMT BATTERY VOLTAGE: 3.14 V
MDC IDC MSMT LEADCHNL RV SENSING INTR AMPL: 11.7 mV
MDC IDC PG SERIAL: 7338650
MDC IDC SET LEADCHNL RV PACING AMPLITUDE: 2.5 V
MDC IDC SET LEADCHNL RV PACING PULSEWIDTH: 0.4 ms
MDC IDC SET LEADCHNL RV SENSING SENSITIVITY: 0.5 mV

## 2016-07-22 ENCOUNTER — Encounter (HOSPITAL_COMMUNITY): Payer: Self-pay

## 2016-07-22 ENCOUNTER — Telehealth (HOSPITAL_COMMUNITY): Payer: Self-pay

## 2016-07-22 ENCOUNTER — Ambulatory Visit (HOSPITAL_COMMUNITY)
Admission: RE | Admit: 2016-07-22 | Discharge: 2016-07-22 | Disposition: A | Discharge status: Home / Self Care | Payer: Medicare Other | Source: Ambulatory Visit | Attending: Internal Medicine | Admitting: Internal Medicine

## 2016-07-22 VITALS — BP 94/62 | HR 70 | Wt 144.6 lb

## 2016-07-22 DIAGNOSIS — I255 Ischemic cardiomyopathy: Secondary | ICD-10-CM | POA: Diagnosis not present

## 2016-07-22 DIAGNOSIS — E785 Hyperlipidemia, unspecified: Secondary | ICD-10-CM | POA: Diagnosis not present

## 2016-07-22 DIAGNOSIS — I472 Ventricular tachycardia, unspecified: Secondary | ICD-10-CM

## 2016-07-22 DIAGNOSIS — Z95810 Presence of automatic (implantable) cardiac defibrillator: Secondary | ICD-10-CM | POA: Diagnosis not present

## 2016-07-22 DIAGNOSIS — I2582 Chronic total occlusion of coronary artery: Secondary | ICD-10-CM | POA: Insufficient documentation

## 2016-07-22 DIAGNOSIS — I739 Peripheral vascular disease, unspecified: Secondary | ICD-10-CM

## 2016-07-22 DIAGNOSIS — R531 Weakness: Secondary | ICD-10-CM | POA: Insufficient documentation

## 2016-07-22 DIAGNOSIS — I252 Old myocardial infarction: Secondary | ICD-10-CM | POA: Diagnosis not present

## 2016-07-22 DIAGNOSIS — Z8249 Family history of ischemic heart disease and other diseases of the circulatory system: Secondary | ICD-10-CM | POA: Diagnosis not present

## 2016-07-22 DIAGNOSIS — I5022 Chronic systolic (congestive) heart failure: Secondary | ICD-10-CM | POA: Insufficient documentation

## 2016-07-22 DIAGNOSIS — Z79899 Other long term (current) drug therapy: Secondary | ICD-10-CM | POA: Diagnosis not present

## 2016-07-22 DIAGNOSIS — Z9889 Other specified postprocedural states: Secondary | ICD-10-CM | POA: Insufficient documentation

## 2016-07-22 DIAGNOSIS — I350 Nonrheumatic aortic (valve) stenosis: Secondary | ICD-10-CM | POA: Diagnosis not present

## 2016-07-22 DIAGNOSIS — Z7982 Long term (current) use of aspirin: Secondary | ICD-10-CM | POA: Diagnosis not present

## 2016-07-22 DIAGNOSIS — Z7902 Long term (current) use of antithrombotics/antiplatelets: Secondary | ICD-10-CM | POA: Insufficient documentation

## 2016-07-22 DIAGNOSIS — I2510 Atherosclerotic heart disease of native coronary artery without angina pectoris: Secondary | ICD-10-CM | POA: Diagnosis not present

## 2016-07-22 DIAGNOSIS — D649 Anemia, unspecified: Secondary | ICD-10-CM | POA: Diagnosis not present

## 2016-07-22 DIAGNOSIS — E7800 Pure hypercholesterolemia, unspecified: Secondary | ICD-10-CM | POA: Diagnosis not present

## 2016-07-22 DIAGNOSIS — I25810 Atherosclerosis of coronary artery bypass graft(s) without angina pectoris: Secondary | ICD-10-CM

## 2016-07-22 DIAGNOSIS — Z951 Presence of aortocoronary bypass graft: Secondary | ICD-10-CM | POA: Diagnosis present

## 2016-07-22 DIAGNOSIS — I340 Nonrheumatic mitral (valve) insufficiency: Secondary | ICD-10-CM | POA: Diagnosis not present

## 2016-07-22 DIAGNOSIS — I714 Abdominal aortic aneurysm, without rupture: Secondary | ICD-10-CM | POA: Diagnosis not present

## 2016-07-22 LAB — COMPREHENSIVE METABOLIC PANEL
ALT: 27 U/L (ref 17–63)
ANION GAP: 7 (ref 5–15)
AST: 24 U/L (ref 15–41)
Albumin: 3.8 g/dL (ref 3.5–5.0)
Alkaline Phosphatase: 45 U/L (ref 38–126)
BUN: 24 mg/dL — ABNORMAL HIGH (ref 6–20)
CHLORIDE: 104 mmol/L (ref 101–111)
CO2: 28 mmol/L (ref 22–32)
CREATININE: 1.92 mg/dL — AB (ref 0.61–1.24)
Calcium: 9.6 mg/dL (ref 8.9–10.3)
GFR, EST AFRICAN AMERICAN: 37 mL/min — AB (ref >60)
GFR, EST NON AFRICAN AMERICAN: 32 mL/min — AB (ref >60)
Glucose, Bld: 97 mg/dL (ref 65–99)
POTASSIUM: 5.2 mmol/L — AB (ref 3.5–5.1)
SODIUM: 139 mmol/L (ref 135–145)
Total Bilirubin: 0.5 mg/dL (ref 0.3–1.2)
Total Protein: 7.3 g/dL (ref 6.5–8.1)

## 2016-07-22 LAB — LIPID PANEL
CHOLESTEROL: 132 mg/dL (ref 0–200)
HDL: 41 mg/dL (ref >40)
LDL Cholesterol: 79 mg/dL (ref 0–99)
TRIGLYCERIDES: 59 mg/dL (ref <150)
Total CHOL/HDL Ratio: 3.2 RATIO
VLDL: 12 mg/dL (ref 0–40)

## 2016-07-22 NOTE — Progress Notes (Signed)
Patient ID: Andre Wilson, male   DOB: 02-07-1936, 80 y.o.   MRN: [id number]   PCP: Dr. Arthor CaptainJ. Copland Cardiology: Dr. Shirlee LatchMcLean  80 yo with history of CAD s/p CABG, ischemic cardiomyopathy, and recent GI bleed presents for cardiology followup after hospitalization.  Patient has been followed in the past in [place].  He had CABG back in 1987.  He had an admission in [place] in 3/17 with GI bleeding.  Per patient and daughter, bleeding was severe but he did not have endoscopy.  He is back on ASA 81 and Plavix.    He was admitted to [facility] in 4/17 with acute on chronic systolic CHF.  After hospitalization, he had a VT arrest.  Echo showed EF 25-30% with moderately decreased RV function, probably moderate AS, and mod-severe MR.  LHC was done, showing 3/4 occluded bypass grafts.  LIMA-LAD was patent but had 70-75% anastomotic stenosis.  He was medically managed. RHC showed near-normal filling pressures.  St Jude ICD was placed.   Admitted 01/18/16 with anterior STEMI, TnI to 45.  Repeat LHC showed similar anatomy to 4/17 LHC with diffusely diseased RCA, totally occluded LCx and LAD, and 75% anastomotic lesion at LIMA touchdown. Touchdown lesion was likely the culprit. His last 2 admissions were likely ischemia-related, VT arrest in 4/17 and anterior STEMI in 5/17. He had successful PCI to the LIMA-LAD anastomotic lesion on 01/20/16. Discharge weight was 140 pounds.  RHC in 6/17 showed preserved cardiac output and normal filling pressures.   He had peripheral angiography in 8/17 showing occluded bilateral SFAs and significant bilateral CIA disease.  No good percutaneous options, and probably a poor candidate for aortobifemoral bypass.   He presents today for regular follow up.  Stable from last visit. No real dyspnea. Mostly limited by leg pains and back pain.  Spoke with Pattricia BossAnnie in research, not interested in [employer], but looking at multiple medicine studies.  + calf pain at night. No worsening  myalgias since switch to Crestor.  No dyspnea walking around house. Remains more limited by back pain than anything else. No chest pain.    Labs (4/17): K 4.5, creatinine 1.42, LDL 120, HCT 31.7 Labs (5/17): K 4.3, creatinine 1.49, BNP 645, TSH normal, HCT 38.8, LFTs normal Labs (01/27/2016):  K 5.2, Creatinine 1.68, BNP 688, Hgb 10.9  Labs (8/17): K 5.1, creatinine 1.4, hgb 10.6  PMH: 1. CAD: S/p CABG 1987.  - LHC (4/17) with total occlusion of vein grafts x 3.  LIMA-LAD patent with 70-75% anastomotic stenosis.  Total occlusion of the LAD, LCx, and diffuse disease throughout the RCA.  He was managed medically.  - Admission 5/17 with anterior STEMI, troponin to 45.  LHC in 5/17 showed similar anatomy to 4/17 with diffusely diseased RCA, totally occluded LCx and LAD, and 75% anastomotic lesion at LIMA touchdown. Touchdown lesion was likely the culprit. He had successful PCI to the LIMA-LAD anastomotic lesion on 01/20/16. 2. Chronic systolic CHF: Ischemic cardiomyopathy.  St Jude ICD.  - Echo (4/17): EF 25-30%, inferolateral akinesis, moderately decreased RV systolic function, ?moderate aortic stenosis but mean gradient 10 mmHg, moderate to severe MR.  - RHC (4/17): mean RA 2, PA 42/14, mean PCWP 15, CI 2.09. - Echo (5/17): EF 25-30%, anteroseptal AK, mild AS, mild AI, moderate MR, PASP 38 mmHg.  - RHC (6/17): mean RA 1, PA 41/14 mean 24, mean PCWP 10, CI 2.33, PVR 3.3 WU.  3. History of GI bleeding: 3/17.  Per report, bleeding was  severe, but he did not have endoscopy done.   4. PAD: 11/16 cath showed occluded bilateral SFAs.   - Lower extremity dopplers (5/17) with bilateral SFA occlusions, > 50% left external iliac stenosis.  - PV angiography in 8/17 with totally occluded bilateral SFAs, significant bilateral CIA disease => medical treatment.  5. VT: 4/17 admit with VT, loss of consciousness.  6. Hyperlipidemia 7. Aortic stenosis: Probably moderate on 4/17 echo, mild on 5/17 echo.  8. Mitral  regurgitation: Moderate to severe on 4/17 echo, suspect infarct-related.  Moderate on 5/17 echo.  9. AAA: Abdominal US 5/17 with 4.5 cm AAA.  10. 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  Nonsmoker (since college years), no ETOH.   FH: CAD  ROS: All systems reviewed and negative except as per HPI.   Current Outpatient Prescriptions  Medication Sig Dispense Refill  . amiodarone (PACERONE) 200 MG tablet Take 1 tablet (200 mg total) by mouth daily. 30 tablet 3  . aspirin EC 81 MG EC tablet Take 1 tablet (81 mg total) by mouth daily. 30 tablet 3  . clopidogrel (PLAVIX) 75 MG tablet Take 75 mg by mouth daily.    . digoxin (LANOXIN) 0.125 MG tablet Take 1 tablet (0.125 mg total) by mouth daily. 30 tablet 6  . ezetimibe (ZETIA) 10 MG tablet Take 1 tablet (10 mg total) by mouth daily. 30 tablet 1  . ferrous sulfate 325 (65 FE) MG EC tablet Take 325 mg by mouth 3 (three) times daily with meals.    . folic acid (FOLVITE) 1 MG tablet take 1 tablet by mouth daily 30 tablet 3  . furosemide (LASIX) 40 MG tablet Take 0.5 tablets (20 mg total) by mouth daily. 30 tablet 6  . isosorbide-hydrALAZINE (BIDIL) 20-37.5 MG tablet Take 0.5 tablets by mouth 3 (three) times daily. 45 tablet 3  . latanoprost (XALATAN) 0.005 % ophthalmic solution Place 1 drop into both eyes at bedtime.  0  . linaclotide (LINZESS) 145 MCG CAPS capsule Take 1 capsule (145 mcg total) by mouth daily as needed (for constipation). Reported on 01/01/2016 30 capsule 3  . nitroGLYCERIN (NITROSTAT) 0.4 MG SL tablet Place 0.4 mg under the tongue every 5 (five) minutes as needed for chest pain.    Marland Kitchen. oxyCODONE-acetaminophen (PERCOCET/ROXICET) 5-325 MG tablet Take 1 tablet by mouth every 6 (six) hours as needed for severe pain. 10 tablet 0  . promethazine (PHENERGAN) 12.5 MG tablet Take 1 tablet (12.5 mg total) by mouth every 6 (six) hours as needed for nausea or vomiting. 30 tablet 0  . ranolazine  (RANEXA) 500 MG 12 hr tablet Take 1 tablet (500 mg total) by mouth 2 (two) times daily. 60 tablet 5  . rosuvastatin (CRESTOR) 40 MG tablet Take 1 tablet (40 mg total) by mouth daily. 90 tablet 3  . spironolactone (ALDACTONE) 25 MG tablet take 1/2 tablets by mouth every morning 15 tablet 1  . timolol (TIMOPTIC) 0.5 % ophthalmic solution Place 1 drop into both eyes 2 (two) times daily.  0  . traZODone (DESYREL) 50 MG tablet Take 1 tablet (50 mg total) by mouth at bedtime as needed for sleep. 90 tablet 3  . vitamin C (ASCORBIC ACID) 500 MG tablet Take 500 mg by mouth 2 (two) times daily.     No current facility-administered medications for this encounter.    BP 94/62 (BP Location: Right Arm, Patient Position: Sitting, Cuff Size: Normal)   Pulse 70  Wt 144 lb 9.6 oz (65.6 kg)   SpO2 99%   BMI 21.35 kg/m    Wt Readings from Last 3 Encounters:  07/22/16 144 lb 9.6 oz (65.6 kg)  06/10/16 146 lb (66.2 kg)  06/05/16 145 lb (65.8 kg)   General: Elderly appearing. Arrived in [facility].  Neck: No JVD, no thyromegaly or thyroid nodule.  Lungs: CTAB, normal effort.  CV: Nondisplaced PMI.  Heart regular S1/S2, no S3/S4, 2/6 SEM RUSB with clear S2. No edema. No carotid bruit.  Unable to palpate pedal pulses.  Abdomen: Soft, NT, ND, no HSM. No bruits or masses. +BS   Skin: Intact without lesions or rashes.  Neurologic: Alert and oriented x 3.  Psych: Normal affect. Extremities: No clubbing or cyanosis.  HEENT: Normal.   Assessment/Plan: 1. CAD: s/p CABG 1987.  4/17 cath showed all SVGs occluded.  The LIMA-LAD had a 70-75% anastomotic lesion.  Readmitted with anterior STEMI in 5/17.  Repeat cath showed similar anatomy to 4/17 cath.  Had PCI 01/20/16 to LIMA-LAD anastomotic lesion.  No further chest pain.  - Continue ASA 81 and Plavix.  - Switched to Crestor last visit.  Re-check lipids today.  - He can continue Ranexa.  2. Chronic systolic CHF: Ischemic cardiomyopathy.  EF 20-25% on 4/17 echo, 25-30% on  5/17 echo. Has St Jude ICD.   - Volume status stable on exam.  - NYHA classification difficult with fatigue and leg pains, but likely at least NYHA III.   - Off coreg due to profound fatigue.  - Continue digoxin, check level today.     - Continue Bidil 1/2 tab tid. No up-titration with soft pressures.  - Continue spironolactone 12.5 mg 3. PAD: Leg weakness is a major complaint.  Peripheral angiography by Dr Kirke Corin in 8/17, report above.  Plan medical management as he does not have clear claudication and aortobifemoral bypass would be high risk. 4. VT: Likely scar-mediated.  He is now on amiodarone.  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  - Continue amiodarone 200 mg daily.   - TSH and LFTs stable 06/10/16. Will need regular eye exams with amiodarone use.  5. Anemia: GI bleeding of uncertain etiology in 3/17.  CBC today.  6. Aortic stenosis: Appeared mild on last echo.  7. Mitral regurgitation: Moderate on last echo, likely infarct-related.  8. AAA: 4.5 cm in 5/17.  Repeat abdominal US in 11/17.   CMET and lipid repeat today.  Follow up 2 months with MD.  No room to up-titrate meds with soft pressures.   Graciella Freer, PA-C 07/22/2016

## 2016-07-22 NOTE — Patient Instructions (Signed)
Routine lab work today. Will notify you of abnormal results, otherwise no news is good news!  Follow up with Dr. McLean in 2 months.  Do the following things EVERYDAY: 1) Weigh yourself in the morning before breakfast. Write it down and keep it in a log. 2) Take your medicines as prescribed 3) Eat low salt foods-Limit salt (sodium) to 2000 mg per day.  4) Stay as active as you can everyday 5) Limit all fluids for the day to less than 2 liters  

## 2016-07-22 NOTE — Telephone Encounter (Signed)
Lipid Profile  Order: [id number]  Status:  Final result Visible to patient:  No (Not Released) Dx:  Hypercholesteremia  Notes Recorded by Chyrl CivatteMegan G Bradley, RN on 07/22/2016 at 4:16 PM EST Reviewed with daughter, aware and agreeable to plan as stated above, lab scheduled for 12/13 (soonest time available to take patient for labs that works with her schedule) ------  Notes Recorded by Graciella FreerMichael Andrew Tillery, PA-C on 07/22/2016 at 3:42 PM EST Pressure somewhat soft today. Creatinine up.   Please make sure he is not taking any NSAIDS.  Hold lasix x 2 days then resume at same dose.   Follow up BMET 7-10 days to monitor.    Casimiro NeedleMichael [address]"Andy" [place], PA-C 07/22/2016 3:41 PM

## 2016-07-27 ENCOUNTER — Other Ambulatory Visit (HOSPITAL_COMMUNITY): Payer: Self-pay | Admitting: *Deleted

## 2016-07-27 MED ORDER — VITAMIN C 500 MG PO TABS: 500.0000 mg | ORAL_TABLET | Freq: Two times a day (BID) | ORAL | 3 refills | Status: AC

## 2016-07-27 MED ORDER — CLOPIDOGREL BISULFATE 75 MG PO TABS: 75.0000 mg | ORAL_TABLET | Freq: Every day | ORAL | 3 refills | Status: DC

## 2016-07-27 MED ORDER — EZETIMIBE 10 MG PO TABS: 10.0000 mg | ORAL_TABLET | Freq: Every day | ORAL | 1 refills | Status: DC

## 2016-07-27 MED ORDER — SPIRONOLACTONE 25 MG PO TABS: ORAL_TABLET | ORAL | 1 refills | Status: DC

## 2016-07-27 MED ORDER — AMIODARONE HCL 200 MG PO TABS: 200.0000 mg | ORAL_TABLET | Freq: Every day | ORAL | 3 refills | Status: DC

## 2016-07-27 MED ORDER — FERROUS SULFATE 325 (65 FE) MG PO TBEC: 325.0000 mg | DELAYED_RELEASE_TABLET | Freq: Three times a day (TID) | ORAL | 3 refills | Status: AC

## 2016-08-04 ENCOUNTER — Ambulatory Visit (HOSPITAL_COMMUNITY)
Admission: RE | Admit: 2016-08-04 | Discharge: 2016-08-04 | Disposition: A | Discharge status: Home / Self Care | Payer: Medicare Other | Source: Ambulatory Visit | Attending: Cardiology | Admitting: Cardiology

## 2016-08-04 DIAGNOSIS — E7800 Pure hypercholesterolemia, unspecified: Secondary | ICD-10-CM | POA: Insufficient documentation

## 2016-08-04 LAB — HEPATIC FUNCTION PANEL
ALK PHOS: 42 U/L (ref 38–126)
ALT: 24 U/L (ref 17–63)
AST: 25 U/L (ref 15–41)
Albumin: 3.9 g/dL (ref 3.5–5.0)
BILIRUBIN DIRECT: 0.2 mg/dL (ref 0.1–0.5)
BILIRUBIN INDIRECT: 0.6 mg/dL (ref 0.3–0.9)
Total Bilirubin: 0.8 mg/dL (ref 0.3–1.2)
Total Protein: 7.3 g/dL (ref 6.5–8.1)

## 2016-08-04 LAB — LIPID PANEL
CHOL/HDL RATIO: 3.1 ratio
Cholesterol: 129 mg/dL (ref 0–200)
HDL: 42 mg/dL (ref >40)
LDL Cholesterol: 77 mg/dL (ref 0–99)
Triglycerides: 49 mg/dL (ref <150)
VLDL: 10 mg/dL (ref 0–40)

## 2016-08-09 ENCOUNTER — Ambulatory Visit (INDEPENDENT_AMBULATORY_CARE_PROVIDER_SITE_OTHER): Payer: Medicare Other

## 2016-08-09 DIAGNOSIS — I5022 Chronic systolic (congestive) heart failure: Secondary | ICD-10-CM | POA: Diagnosis not present

## 2016-08-09 DIAGNOSIS — Z95810 Presence of automatic (implantable) cardiac defibrillator: Secondary | ICD-10-CM

## 2016-08-09 NOTE — Progress Notes (Signed)
EPIC Encounter for ICM Monitoring  Patient Name: Andre Wilson is a 80 y.o. male Date: 08/09/2016 Primary Care Physican: Lamar Blinks, MD Primary Cardiologist:McLean Electrophysiologist: Camnitz Dry Weight:140 lbs                                                      Spoke with daughter, [name]).  Heart Failure questions reviewed, pt has some coughing at night but she stated this has been ongoing and nothing new.    Thoracic impedance normal   Labs: 07/22/2016 Creatinine 1.92, BUN 24, Potassium 5.2, Sodium 139, EGFR 32-37 06/10/2016 Creatinine 1.54, BUN 22, Potassium 5.2, Sodium 137, EGFR 42-48  04/09/2016 Creatinine 1.40, BUN 21, Potassium 5.1, Sodium 139  02/10/2016 Creatinine 1.46, BUN 23, Potassium 5.0, Sodium 132, EGFR 45-52  01/27/2016 Creatinine 1.68, BUN 22, Potassium 5.2, Sodium 135, EGFR 38-44  01/22/2016 Creatinine 1.42, BUN 24, Potassium 4.7, Sodium 135, EGFR 46-53  01/21/2016 Creatinine 1.59, BUN 30, Potassium 4.5, Sodium 136, EGFR 40-47  01/20/2016 Creatinine 1.35, BUN 30, Potassium 4.5, Sodium 135, EGFR 49-57  01/19/2016 Creatinine 1.54, BUN 29, Potassium 4.6, Sodium 135, EGFR 42-48  01/18/2016 Creatinine 1.99, BUN 30, Potassium 5.1, Sodium 137, EGFR 31-36 01/12/2016 Creatinine 1.54, BUN 21, Potassium 4.6, Sodium 139, EGFR 42-48  01/01/2016 Creatinine 1.49, BUN 28, Potassium 4.3, Sodium 135  12/26/2015 Creatinine 1.48, BUN 20, Potassium 4.5, Sodium 136, EGFR 44-51   Recommendations: No changes.  Advised if coughing becomes worse to call back.  Reinforced to limit low salt food choices to 2000 mg day and limiting fluid intake to < 2 liters per day. Encouraged to call for fluid symptoms.  Provided ICM direct number.   Follow-up plan: ICM clinic phone appointment on 09/09/2016.  Office appointment with Dr Fletcher Anon 08/31/2016 and HF clinic 09/24/2016  Copy of ICM check sent to primary cardiologist and device physician.   ICM trend:  08/09/2017       Rosalene Billings, RN 08/09/2016 4:23 PM

## 2016-08-11 ENCOUNTER — Other Ambulatory Visit (HOSPITAL_COMMUNITY): Payer: Medicare Other

## 2016-08-27 ENCOUNTER — Telehealth (HOSPITAL_COMMUNITY): Payer: Self-pay | Admitting: *Deleted

## 2016-08-27 NOTE — Telephone Encounter (Signed)
Patients daughter called stating patient had swelling in his feet and ankles was advised to take lasix 20mg  bid for 3 days and come in for labs next Thursday per Mardelle MatteAndy patients daughter aware and agreeable

## 2016-08-31 ENCOUNTER — Ambulatory Visit: Payer: Medicare Other | Admitting: Cardiovascular Disease

## 2016-09-02 ENCOUNTER — Telehealth (HOSPITAL_COMMUNITY): Payer: Self-pay | Admitting: Cardiology

## 2016-09-02 ENCOUNTER — Ambulatory Visit (HOSPITAL_COMMUNITY)
Admission: RE | Admit: 2016-09-02 | Discharge: 2016-09-02 | Disposition: A | Discharge status: Home / Self Care | Payer: Medicare Other | Source: Ambulatory Visit | Attending: Cardiology | Admitting: Cardiology

## 2016-09-02 ENCOUNTER — Other Ambulatory Visit (HOSPITAL_COMMUNITY): Payer: Self-pay | Admitting: *Deleted

## 2016-09-02 ENCOUNTER — Other Ambulatory Visit (HOSPITAL_COMMUNITY): Payer: Medicare Other

## 2016-09-02 DIAGNOSIS — I5022 Chronic systolic (congestive) heart failure: Secondary | ICD-10-CM

## 2016-09-02 DIAGNOSIS — I509 Heart failure, unspecified: Secondary | ICD-10-CM

## 2016-09-02 LAB — BASIC METABOLIC PANEL
ANION GAP: 9 (ref 5–15)
BUN: 27 mg/dL — AB (ref 6–20)
CALCIUM: 9.4 mg/dL (ref 8.9–10.3)
CO2: 27 mmol/L (ref 22–32)
Chloride: 102 mmol/L (ref 101–111)
Creatinine, Ser: 2.02 mg/dL — ABNORMAL HIGH (ref 0.61–1.24)
GFR calc Af Amer: 35 mL/min — ABNORMAL LOW (ref >60)
GFR calc non Af Amer: 30 mL/min — ABNORMAL LOW (ref >60)
Glucose, Bld: 93 mg/dL (ref 65–99)
POTASSIUM: 4.6 mmol/L (ref 3.5–5.1)
SODIUM: 138 mmol/L (ref 135–145)

## 2016-09-02 NOTE — Telephone Encounter (Signed)
-----   Message from Graciella FreerMichael Andrew Tillery, PA-C sent at 09/02/2016 11:39 AM EST ----- Creatinine remains elevated. Please check how he is taking lasix.  Has 20 mg (1/2 of a 40 mg tablet) ordered daily.   Have him hold lasix 3 days if only taking 20 mg daily.  Repeat 10-14 days.    Casimiro NeedleMichael [address]Andy" [place], PA-C 09/02/2016 11:39 AM

## 2016-09-02 NOTE — Telephone Encounter (Signed)
Patient aware. Via daughter, reports he normally takes 20 mg daily, was instructed to increase to 40 mg x 2 days and 60 mg on day 3 for BLE edema and abdominal swelling.  Patient should continue routine dose of 20 mg daily. Repeat labs in 7 days per Otilio SaberAndy Tillery, PA Daughter questioned if edema returns or gets worse should she automatically resume increased dose of lasix ADVISED to not increase dose again, however she should call office again for further instructions. Voiced understanding. Repeat labs 1/17

## 2016-09-07 ENCOUNTER — Other Ambulatory Visit (HOSPITAL_COMMUNITY): Payer: Self-pay | Admitting: Adult Health

## 2016-09-09 ENCOUNTER — Other Ambulatory Visit (HOSPITAL_COMMUNITY): Payer: Medicare Other

## 2016-09-09 ENCOUNTER — Ambulatory Visit (INDEPENDENT_AMBULATORY_CARE_PROVIDER_SITE_OTHER): Payer: Medicare Other

## 2016-09-09 DIAGNOSIS — I509 Heart failure, unspecified: Secondary | ICD-10-CM

## 2016-09-09 DIAGNOSIS — Z95810 Presence of automatic (implantable) cardiac defibrillator: Secondary | ICD-10-CM

## 2016-09-10 NOTE — Progress Notes (Signed)
EPIC Encounter for ICM Monitoring  Patient Name: Andre Wilson is a 81 y.o. male Date: 09/10/2016 Primary Care Physican: Lamar Blinks, MD Primary [place] Electrophysiologist: [facility] Dry Weight:unknwn      Thoracic impedance normal after lasix was increased x 3 days per HF clinic and BMET drawn on 1/11 and Lasix dosage remains at 20 mg daily.  Labs: Repeat labs ordered by HF clinic 09/02/2016 Creatinine 2.02, BUN 27, Potassium 4.6, Sodium 138, EGFR 30-35 07/22/2016 Creatinine 1.92, BUN 24, Potassium 5.2, Sodium 139, EGFR 32-37 06/10/2016 Creatinine 1.54, BUN 22, Potassium 5.2, Sodium 137, EGFR 42-48  04/09/2016 Creatinine 1.40, BUN 21, Potassium 5.1, Sodium 139  02/10/2016 Creatinine 1.46, BUN 23, Potassium 5.0, Sodium 132, EGFR 45-52  01/27/2016 Creatinine 1.68, BUN 22, Potassium 5.2, Sodium 135, EGFR 38-44  01/22/2016 Creatinine 1.42, BUN 24, Potassium 4.7, Sodium 135, EGFR 46-53  01/21/2016 Creatinine 1.59, BUN 30, Potassium 4.5, Sodium 136, EGFR 40-47  01/20/2016 Creatinine 1.35, BUN 30, Potassium 4.5, Sodium 135, EGFR 49-57  01/19/2016 Creatinine 1.54, BUN 29, Potassium 4.6, Sodium 135, EGFR 42-48  01/18/2016 Creatinine 1.99, BUN 30, Potassium 5.1, Sodium 137, EGFR 31-36 01/12/2016 Creatinine 1.54, BUN 21, Potassium 4.6, Sodium 139, EGFR 42-48  01/01/2016 Creatinine 1.49, BUN 28, Potassium 4.3, Sodium 135  12/26/2015 Creatinine 1.48, BUN 20, Potassium 4.5, Sodium 136, EGFR 44-51   Recommendations: None  Follow-up plan: ICM clinic phone appointment on 10/04/2016.  Office appointment with Dr Fletcher Anon 09/21/2016 and HF clinic appt 09/24/2016  Copy of ICM check sent to device physician.   3 month ICM trend: 09/10/2016   1 Year ICM trend:      Rosalene Billings, RN 09/10/2016 5:43 PM

## 2016-09-13 ENCOUNTER — Telehealth (HOSPITAL_COMMUNITY): Payer: Self-pay | Admitting: Vascular Surgery

## 2016-09-13 DIAGNOSIS — I5022 Chronic systolic (congestive) heart failure: Secondary | ICD-10-CM

## 2016-09-13 NOTE — Telephone Encounter (Signed)
Left pt message to call back to make lab appt this week

## 2016-09-13 NOTE — Telephone Encounter (Signed)
Pt's daughter returned call to our office.  [facility] she can not bring him in until then, per chart pt needed bmet and trough dig level, advised her for pt not to take dig Thur am before labs, she is agreeable.    She also states pt has been having a cough and runny nose since yesterday.  She states his nose is "pouring mucous" and he is coughing pretty bad, although pt asleep at this time.  She states she has given him a dose of mucinex but not sure it has helped, she states his wt is stable, no edema.  Advised to f/u w/pcp, she is agreeable

## 2016-09-16 ENCOUNTER — Ambulatory Visit (HOSPITAL_COMMUNITY)
Admission: RE | Admit: 2016-09-16 | Discharge: 2016-09-16 | Disposition: A | Discharge status: Home / Self Care | Payer: Medicare Other | Source: Ambulatory Visit | Attending: Internal Medicine | Admitting: Internal Medicine

## 2016-09-16 ENCOUNTER — Telehealth (HOSPITAL_COMMUNITY): Payer: Self-pay | Admitting: *Deleted

## 2016-09-16 DIAGNOSIS — I509 Heart failure, unspecified: Secondary | ICD-10-CM | POA: Diagnosis not present

## 2016-09-16 DIAGNOSIS — I5022 Chronic systolic (congestive) heart failure: Secondary | ICD-10-CM | POA: Insufficient documentation

## 2016-09-16 LAB — BASIC METABOLIC PANEL
Anion gap: 9 (ref 5–15)
BUN: 27 mg/dL — ABNORMAL HIGH (ref 6–20)
CHLORIDE: 104 mmol/L (ref 101–111)
CO2: 25 mmol/L (ref 22–32)
Calcium: 9.7 mg/dL (ref 8.9–10.3)
Creatinine, Ser: 2.01 mg/dL — ABNORMAL HIGH (ref 0.61–1.24)
GFR calc non Af Amer: 30 mL/min — ABNORMAL LOW (ref >60)
GFR, EST AFRICAN AMERICAN: 35 mL/min — AB (ref >60)
GLUCOSE: 88 mg/dL (ref 65–99)
Potassium: 5.3 mmol/L — ABNORMAL HIGH (ref 3.5–5.1)
Sodium: 138 mmol/L (ref 135–145)

## 2016-09-16 LAB — DIGOXIN LEVEL: Digoxin Level: 1.9 ng/mL (ref 0.8–2.0)

## 2016-09-16 MED ORDER — DIGOXIN 125 MCG PO TABS: 0.0625 mg | ORAL_TABLET | Freq: Every day | ORAL | 5 refills | Status: DC

## 2016-09-16 NOTE — Telephone Encounter (Signed)
Notes Recorded by Modesta MessingJasmine Q Harry Shuck, CMA on 09/16/2016 at 3:21 PM EST Toniann FailWendy aware and agreeable labs added to 2/2 appt. ------  Notes Recorded by Laurey Moralealton S McLean, MD on 09/16/2016 at 3:16 PM EST Stop digoxin x 3 days. Decrease to 0.0625 daily after that. Follow low K diet, stop any supplemental K. Needs repeat BMET and digoxin trough in 1 week. Call today.    Ref Range & Units 10:21 43mo ago   Digoxin Level 0.8 - 2.0 ng/mL 1.9  1.5

## 2016-09-21 ENCOUNTER — Encounter: Payer: Self-pay | Admitting: Cardiovascular Disease

## 2016-09-21 ENCOUNTER — Ambulatory Visit (INDEPENDENT_AMBULATORY_CARE_PROVIDER_SITE_OTHER): Payer: Medicare Other | Admitting: Cardiovascular Disease

## 2016-09-21 VITALS — BP 126/67 | HR 74 | Ht 69.0 in | Wt 142.4 lb

## 2016-09-21 DIAGNOSIS — I714 Abdominal aortic aneurysm, without rupture, unspecified: Secondary | ICD-10-CM

## 2016-09-21 DIAGNOSIS — E785 Hyperlipidemia, unspecified: Secondary | ICD-10-CM | POA: Diagnosis not present

## 2016-09-21 DIAGNOSIS — I739 Peripheral vascular disease, unspecified: Secondary | ICD-10-CM | POA: Diagnosis not present

## 2016-09-21 NOTE — Patient Instructions (Signed)

## 2016-09-21 NOTE — Progress Notes (Signed)
Cardiology Office Note   Date:  09/21/2016   ID:  [name], DOB November 06, 1935, MRN [id number]  PCP:  Abbe Amsterdam, MD  Cardiologist:  Dr. Shirlee Latch  Chief Complaint  Patient presents with  . Follow-up  . Shortness of Breath    occassionally.  . Dizziness    occassioanlly.  . Leg Pain    occassionally.  . Edema    in anes.      History of Present Illness: Andre Wilson is a 81 y.o. male who is here today for a follow up visit regarding peripheral arterial disease. He has known history of coronary artery disease status post CABG, ischemic cardiomyopathy and history of GI bleed few months ago. He had CABG in 1987. The patient moved from [place] in March. He was hospitalized in April at [facility] for acute on chronic systolic heart failure. He had a VT arrest. Echocardiogram showed an EF of 25-30% with moderate to severe MR. Left heart catheterization showed 3 out of 4 occluded bypass grafts. LIMA to LAD was patent but there was significant anastomosis stenosis. He had successful PCI to LIMA to LAD. He has been tolerating dual antiplatelet therapy.  He has known history of abdominal aortic aneurysm and peripheral arterial disease and was seen by different vascular surgeons before he moved to this area. He is known to have bilateral SFA occlusions for years.  He underwent noninvasive vascular evaluation in May which showed moderately reduced ABI bilaterally. Duplex showed moderate size aortic aneurysm measuring 4.5 x 3.8 cm. I proceeded with angiography in 03/2016 but he was found to have occluded bilateral common femoral arteries and could not obtain access from the femoral approach. Angiography was performed via the left brachial artery and showed significant bilateral common iliac artery disease, heavily calcified and occluded bilateral common femoral arteries as well as heavily calcified and occluded bilateral SFAs.  I did not feel the patient would be a good candidate for  aortobifemoral bypass with femoral artery endarterectomy. He is also very limited due to chronic back pain and poor balance and thus he does not experience claudication as much.   Past Medical History:  Diagnosis Date  . Arthritis   . Blood transfusion without reported diagnosis   . CAD (coronary artery disease)   . Cardiac arrest [facility]) secondary to sustained V tach requiring CPR 12/15/15 12/15/2015   s/p [employer] [place] VR (serial Number [id number]) ICD  . Cataract   . CHF (congestive heart failure) (HCC)   . GI bleed   . Glaucoma   . Heart murmur   . Hypercholesteremia   . Hypertension   . MI (myocardial infarction)   . Oxygen deficiency   . Sustained VT (ventricular tachycardia) (HCC) 12/15/2015    Past Surgical History:  Procedure Laterality Date  . CARDIAC CATHETERIZATION     CABG 1987  . CARDIAC CATHETERIZATION N/A 12/17/2015   Procedure: Right/Left Heart Cath and Coronary/Graft Angiography;  Surgeon: Lyn Records, MD;  Location: [facility] INVASIVE CV LAB;  Service: Cardiovascular;  Laterality: N/A;  . CARDIAC CATHETERIZATION N/A 01/18/2016   Procedure: Left Heart Cath and Coronary Angiography;  Surgeon: Runell Gess, MD;  Location[facility] INVASIVE CV LAB;  Service: Cardiovascular;  Laterality: N/A;  . CARDIAC CATHETERIZATION N/A 01/20/2016   Procedure: Coronary Stent Intervention;  Surgeon: Peter M Swaziland, MD;  Location: [facility] INVASIVE CV LAB;  Service: Cardiovascular;  Laterality: N/A;  . CARDIAC CATHETERIZATION N/A 02/12/2016   Procedure: Right Heart Cath;  Surgeon: Freida Busman  Alford Highland, MD;  Location: [facility] INVASIVE CV LAB;  Service: Cardiovascular;  Laterality: N/A;  . EP IMPLANTABLE DEVICE N/A 12/18/2015   Procedure: ICD Implant;  Surgeon: Will Jorja Loa, MD; [facility] [place] VR (serial Number [id number]) ICD   . PERIPHERAL VASCULAR CATHETERIZATION N/A 04/14/2016   Procedure: Abdominal Aortogram w/Lower Extremity;  Surgeon: Iran Ouch, MD;  Location: [facility] INVASIVE CV LAB;  Service:  Cardiovascular;  Laterality: N/A;  . ULTRASOUND GUIDANCE FOR VASCULAR ACCESS  01/20/2016   Procedure: Ultrasound Guidance For Vascular Access;  Surgeon: Peter M Swaziland, MD;  Location: [facility] INVASIVE CV LAB;  Service: Cardiovascular;;     Current Outpatient Prescriptions  Medication Sig Dispense Refill  . amiodarone (PACERONE) 200 MG tablet Take 1 tablet (200 mg total) by mouth daily. 30 tablet 3  . aspirin EC 81 MG EC tablet Take 1 tablet (81 mg total) by mouth daily. 30 tablet 3  . clopidogrel (PLAVIX) 75 MG tablet Take 1 tablet (75 mg total) by mouth daily. 30 tablet 3  . digoxin (DIGITEK) 0.125 MG tablet Take 0.5 tablets (0.0625 mg total) by mouth daily. 15 tablet 5  . ezetimibe (ZETIA) 10 MG tablet Take 1 tablet (10 mg total) by mouth daily. 30 tablet 1  . ferrous sulfate 325 (65 FE) MG EC tablet Take 1 tablet (325 mg total) by mouth 3 (three) times daily with meals. 90 tablet 3  . folic acid (FOLVITE) 1 MG tablet take 1 tablet by mouth daily 30 tablet 3  . furosemide (LASIX) 40 MG tablet Take 0.5 tablets (20 mg total) by mouth daily. 30 tablet 6  . isosorbide-hydrALAZINE (BIDIL) 20-37.5 MG tablet Take 0.5 tablets by mouth 3 (three) times daily. 45 tablet 3  . latanoprost (XALATAN) 0.005 % ophthalmic solution Place 1 drop into both eyes at bedtime.  0  . linaclotide (LINZESS) 145 MCG CAPS capsule Take 1 capsule (145 mcg total) by mouth daily as needed (for constipation). Reported on 01/01/2016 30 capsule 3  . nitroGLYCERIN (NITROSTAT) 0.4 MG SL tablet Place 0.4 mg under the tongue every 5 (five) minutes as needed for chest pain.    Marland Kitchen oxyCODONE-acetaminophen (PERCOCET/ROXICET) 5-325 MG tablet Take 1 tablet by mouth every 6 (six) hours as needed for severe pain. 10 tablet 0  . promethazine (PHENERGAN) 12.5 MG tablet Take 1 tablet (12.5 mg total) by mouth every 6 (six) hours as needed for nausea or vomiting. 30 tablet 0  . ranolazine (RANEXA) 500 MG 12 hr tablet Take 1 tablet (500 mg total) by  mouth 2 (two) times daily. 60 tablet 5  . spironolactone (ALDACTONE) 25 MG tablet take 1/2 tablets by mouth every morning 15 tablet 1  . timolol (TIMOPTIC) 0.5 % ophthalmic solution Place 1 drop into both eyes 2 (two) times daily.  0  . traZODone (DESYREL) 50 MG tablet Take 1 tablet (50 mg total) by mouth at bedtime as needed for sleep. 90 tablet 3  . vitamin C (ASCORBIC ACID) 500 MG tablet Take 1 tablet (500 mg total) by mouth 2 (two) times daily. 60 tablet 3  . rosuvastatin (CRESTOR) 40 MG tablet Take 1 tablet (40 mg total) by mouth daily. 90 tablet 3   No current facility-administered medications for this visit.     Allergies:   Patient has no known allergies.    Social History:  The patient  reports that he has quit smoking. He has never used smokeless tobacco. He reports that he does not drink alcohol or use  drugs.   Family History:  The patient's family history includes Aneurysm in his brother; Heart attack in his father; Heart disease in his brother and brother; Hyperlipidemia in his mother; Hypertension in his brother, brother, and mother.    ROS:  Please see the history of present illness.   Otherwise, review of systems are positive for none.   All other systems are reviewed and negative.    PHYSICAL EXAM: VS:  BP 126/67   Pulse 74   Ht 5\' 9"  (1.753 m)   Wt 142 lb 6.4 oz (64.6 kg)   BMI 21.03 kg/m  , BMI Body mass index is 21.03 kg/m. GEN: Well nourished, well developed, in no acute distress  HEENT: normal  Neck: no JVD, carotid bruits, or masses Cardiac: RRR; no rubs, or gallops,no edema . There is 3/6 systolic murmur at the left sternal border  Respiratory:  clear to auscultation bilaterally, normal work of breathing GI: soft, nontender, nondistended, + BS MS: no deformity or atrophy  Skin: warm and dry, no rash Neuro:  Strength and sensation are intact Psych: euthymic mood, full affect Vascular:  Distal pulses are not palpable.   EKG:  EKG is not ordered  today.    Recent Labs: 12/19/2015: Magnesium 2.0 02/10/2016: B Natriuretic Peptide 648.3 04/09/2016: Hemoglobin 10.6; Platelets 317 06/10/2016: TSH 1.912 08/04/2016: ALT 24 09/16/2016: BUN 27; Creatinine, Ser 2.01; Potassium 5.3; Sodium 138    Lipid Panel    Component Value Date/Time   CHOL 129 08/04/2016 0956   TRIG 49 08/04/2016 0956   HDL 42 08/04/2016 0956   CHOLHDL 3.1 08/04/2016 0956   VLDL 10 08/04/2016 0956   LDLCALC 77 08/04/2016 0956      Wt Readings from Last 3 Encounters:  09/21/16 142 lb 6.4 oz (64.6 kg)  07/22/16 144 lb 9.6 oz (65.6 kg)  06/10/16 146 lb (66.2 kg)         ASSESSMENT AND PLAN:  1.  Peripheral arterial disease:  The patient has extensive peripheral arterial disease with moderate size aortic aneurysm, significant bilateral common iliac artery disease and occluded bilateral common femoral arteries and SFAs.  He is severely limited by chronic low back pain which is not likely to be claudication.  Given his comorbidities, he is not a good candidate for aortobifemoral bypass anyway. Continue medical therapy.   2. Moderate size abdominal aortic aneurysm: Continue to monitowith yearly ultrasound   3. Coronary artery disease involving bypass grafts with other forms of angina: His symptoms seem to be stable.  4. Hyperlipidemia: Continue high-dose simvastatin.  5. Chronic systolic heart failure: He appears to be euvolemic.   Disposition:   FU with me in 1 year  Signed,  Lorine BearsMuhammad Antonin Meininger, MD  09/21/2016 10:37 AM    [facility]

## 2016-09-23 DIAGNOSIS — Z006 Encounter for examination for normal comparison and control in clinical research program: Secondary | ICD-10-CM

## 2016-09-24 ENCOUNTER — Encounter (HOSPITAL_COMMUNITY): Payer: Medicare Other

## 2016-09-24 NOTE — Progress Notes (Signed)
Patient present today with his daughter Andre Wilson for [place] HF Study screening. Writer spoke to Andre Wilson a couple of months ago about this research study and gave him a copy of the consent. He verbalizes he has read over the consent and all questions were answered, consent signed before any study procedures were preformed. Vital signs,weight (without shoes), EKG and study labs obtained. Per protocol patient swallowed assigned study placebo pill without problem. He will await a phone call from writer regarding lab results, if requirements met he will be scheduled for randomization visit. Patient very pleasant and happy to participate in the study.

## 2016-10-01 DIAGNOSIS — Z006 Encounter for examination for normal comparison and control in clinical research program: Secondary | ICD-10-CM

## 2016-10-01 NOTE — Progress Notes (Signed)
[facility]/Randomization. Patient states he feels good today and is happy to participate in the study. He denies any sign or symptoms of ACS. He states "I can tell I have a little extra fluid today as my breathing feels a little tight." Reviewed labs with patient, Dr. Shirlee LatchMcLean instructed patient to increase Lasix 40mg  x 3 days and then return to 20mg . Instructions written down for patient as he daughter Toniann FailWendy assist him with his medication administration. Weight, vital signs, labs and study questionnaires completed. Patient will return in 2 weeks/Feb 22 for research visit and is given written instructions with the date, time and reminder to hold study medication that morning. Patient verbalizes he will call study coordinator with any study related questions and will call heart failure with any questions regarding his heart failure symptoms. Very pleasant patient.

## 2016-10-02 ENCOUNTER — Other Ambulatory Visit (HOSPITAL_COMMUNITY): Payer: Self-pay | Admitting: Cardiology

## 2016-10-04 ENCOUNTER — Other Ambulatory Visit (HOSPITAL_COMMUNITY): Payer: Self-pay | Admitting: Student

## 2016-10-04 ENCOUNTER — Ambulatory Visit (INDEPENDENT_AMBULATORY_CARE_PROVIDER_SITE_OTHER): Payer: Medicare Other | Admitting: *Deleted

## 2016-10-04 ENCOUNTER — Ambulatory Visit (INDEPENDENT_AMBULATORY_CARE_PROVIDER_SITE_OTHER): Payer: Medicare Other

## 2016-10-04 ENCOUNTER — Telehealth: Payer: Self-pay

## 2016-10-04 DIAGNOSIS — I255 Ischemic cardiomyopathy: Secondary | ICD-10-CM

## 2016-10-04 DIAGNOSIS — Z95810 Presence of automatic (implantable) cardiac defibrillator: Secondary | ICD-10-CM | POA: Diagnosis not present

## 2016-10-04 DIAGNOSIS — I509 Heart failure, unspecified: Secondary | ICD-10-CM | POA: Diagnosis not present

## 2016-10-04 MED ORDER — ISOSORB DINITRATE-HYDRALAZINE 20-37.5 MG PO TABS: 0.5000 | ORAL_TABLET | Freq: Three times a day (TID) | ORAL | 3 refills | Status: AC

## 2016-10-04 MED ORDER — FUROSEMIDE 40 MG PO TABS: ORAL_TABLET | ORAL | 3 refills | Status: DC

## 2016-10-04 NOTE — Progress Notes (Signed)
Spoke with daughter, Roseanna RainbowWendy Hooker.  Advised Dr Shirlee LatchMcLean recommended to increase Furosemide to 40 mg daily alternating with 20 mg daily.  BMET to be drawn in 10 days, 10/14/2016 at HF Clinic.  She verbalized understanding. She stated patient has enough Furosemide at this time and did not need to have the prescription filled yet.  Advised when she is ready for refill, it will be a new prescription at the pharmacy. She stated patient also needs refill for Bidil and Folic Acid.  Advised would send request for those 2 meds to refill department.

## 2016-10-04 NOTE — Telephone Encounter (Signed)
Spoke with daughter, Roseanna RainbowWendy Hooker for ICM call.  She stated patient needs refill for Bidil and Folic Acid.  Advised would send request for those 2 meds to refill department.

## 2016-10-04 NOTE — Progress Notes (Signed)
Would increase Lasix to 40 daily alternating with 20 daily at this point and repeat BMET in 10 days.

## 2016-10-04 NOTE — Progress Notes (Signed)
EPIC Encounter for ICM Monitoring  Patient Name: Andre Wilson is a 81 y.o. male Date: 10/04/2016 Primary Care Physican: Lamar Blinks, MD Primary Cardiologist:[place] Electrophysiologist: Curt Bears Dry Weight:139 lbs                                                     Spoke with daughter, [name] [facility]).   Heart Failure questions reviewed, pt reported shortness of breath and has a cough.   Thoracic impedance abnormal suggesting fluid accumulation starting from 10/02/2016 and also from 09/18/2016 to 09/27/2016.     Daughter reported Lasix was increased on 2/10 by Dr Aundra Dubin to 40 mg 1 tablet x 3 days and today is the 3rd day and then return to 1/2 tablet (20 mg total) daily.   It was increased at the time of patient's visit with Desmond Dike, RN during research study appointment.    Labs:  09/16/2016 Creatinine 2.01, BUN 27, Potassium 5.3, Sodium 138, EGFR 30-35 09/02/2016 Creatinine 2.02, BUN 27, Potassium 4.6, Sodium 138, EGFR 30-35 07/22/2016 Creatinine 1.92, BUN 24, Potassium 5.2, Sodium 139, EGFR 32-37 06/10/2016 Creatinine 1.54, BUN 22, Potassium 5.2, Sodium 137, EGFR 42-48  04/09/2016 Creatinine 1.40, BUN 21, Potassium 5.1, Sodium 139  02/10/2016 Creatinine 1.46, BUN 23, Potassium 5.0, Sodium 132, EGFR 45-52  01/27/2016 Creatinine 1.68, BUN 22, Potassium 5.2, Sodium 135, EGFR 38-44  01/22/2016 Creatinine 1.42, BUN 24, Potassium 4.7, Sodium 135, EGFR 46-53  01/21/2016 Creatinine 1.59, BUN 30, Potassium 4.5, Sodium 136, EGFR 40-47  01/20/2016 Creatinine 1.35, BUN 30, Potassium 4.5, Sodium 135, EGFR 49-57  01/19/2016 Creatinine 1.54, BUN 29, Potassium 4.6, Sodium 135, EGFR 42-48  01/18/2016 Creatinine 1.99, BUN 30, Potassium 5.1, Sodium 137, EGFR 31-36 01/12/2016 Creatinine 1.54, BUN 21, Potassium 4.6, Sodium 139, EGFR 42-48  01/01/2016 Creatinine 1.49, BUN 28, Potassium 4.3, Sodium 135  12/26/2015 Creatinine 1.48, BUN 20, Potassium 4.5, Sodium 136, EGFR 44-51    Recommendations:  Copy of ICM check sent to Dr Aundra Dubin for recommendations and device physician.  Follow-up plan: ICM clinic phone appointment on 10/12/2016 to recheck fluid levels.  HF office appointment on 10/15/2016     3 month ICM trend: 10/04/2016   1 Year ICM trend:      Rosalene Billings, RN 10/04/2016 11:59 AM

## 2016-10-04 NOTE — Progress Notes (Signed)
Remote ICD transmission.   

## 2016-10-05 ENCOUNTER — Encounter: Payer: Self-pay | Admitting: Cardiology

## 2016-10-07 LAB — CUP PACEART REMOTE DEVICE CHECK
Battery Voltage: 3.07 V
HighPow Impedance: 66 Ohm
HighPow Impedance: 66 Ohm
Implantable Lead Location: 753860
Implantable Pulse Generator Implant Date: 20170427
Lead Channel Setting Pacing Amplitude: 2.5 V
Lead Channel Setting Pacing Pulse Width: 0.4 ms
Lead Channel Setting Sensing Sensitivity: 0.5 mV
MDC IDC LEAD IMPLANT DT: 20170427
MDC IDC MSMT BATTERY REMAINING LONGEVITY: 96 mo
MDC IDC MSMT BATTERY REMAINING PERCENTAGE: 91 %
MDC IDC MSMT LEADCHNL RV IMPEDANCE VALUE: 480 Ohm
MDC IDC MSMT LEADCHNL RV PACING THRESHOLD AMPLITUDE: 0.75 V
MDC IDC MSMT LEADCHNL RV PACING THRESHOLD PULSEWIDTH: 0.4 ms
MDC IDC MSMT LEADCHNL RV SENSING INTR AMPL: 11.7 mV
MDC IDC SESS DTM: 20180212082539
MDC IDC STAT BRADY RV PERCENT PACED: 1 %
Pulse Gen Serial Number: 7338650

## 2016-10-12 ENCOUNTER — Ambulatory Visit (INDEPENDENT_AMBULATORY_CARE_PROVIDER_SITE_OTHER): Payer: Medicare Other

## 2016-10-12 DIAGNOSIS — Z95810 Presence of automatic (implantable) cardiac defibrillator: Secondary | ICD-10-CM

## 2016-10-12 DIAGNOSIS — I509 Heart failure, unspecified: Secondary | ICD-10-CM

## 2016-10-12 NOTE — Progress Notes (Signed)
EPIC Encounter for ICM Monitoring  Patient Name: Andre Wilson is a 81 y.o. male Date: 10/12/2016 Primary Care Physican: Andre Blinks, MD Primary Cardiologist:Andre Wilson Electrophysiologist: Andre Wilson Dry Weight:unknown      Attempted call to daughter, Andre Wilson.  Left message for return call.    Thoracic impedance returned to normal today after Dr Andre Wilson increased Furosemide to 40 mg daily alternating with 20 mg daily.    Prescribed Furosemide to 40 mg daily alternating with 20 mg daily as of 10/04/2016.   Labs:  09/16/2016 Creatinine 2.01, BUN 27, Potassium 5.3, Sodium 138, EGFR 30-35 09/02/2016 Creatinine 2.02, BUN 27, Potassium 4.6, Sodium 138, EGFR 30-35 07/22/2016 Creatinine 1.92, BUN 24, Potassium 5.2, Sodium 139, EGFR 32-37 06/10/2016 Creatinine 1.54, BUN 22, Potassium 5.2, Sodium 137, EGFR 42-48  04/09/2016 Creatinine 1.40, BUN 21, Potassium 5.1, Sodium 139  02/10/2016 Creatinine 1.46, BUN 23, Potassium 5.0, Sodium 132, EGFR 45-52  01/27/2016 Creatinine 1.68, BUN 22, Potassium 5.2, Sodium 135, EGFR 38-44  01/22/2016 Creatinine 1.42, BUN 24, Potassium 4.7, Sodium 135, EGFR 46-53  01/21/2016 Creatinine 1.59, BUN 30, Potassium 4.5, Sodium 136, EGFR 40-47  01/20/2016 Creatinine 1.35, BUN 30, Potassium 4.5, Sodium 135, EGFR 49-57  01/19/2016 Creatinine 1.54, BUN 29, Potassium 4.6, Sodium 135, EGFR 42-48  01/18/2016 Creatinine 1.99, BUN 30, Potassium 5.1, Sodium 137, EGFR 31-36 01/12/2016 Creatinine 1.54, BUN 21, Potassium 4.6, Sodium 139, EGFR 42-48  01/01/2016 Creatinine 1.49, BUN 28, Potassium 4.3, Sodium 135  12/26/2015 Creatinine 1.48, BUN 20, Potassium 4.5, Sodium 136, EGFR 44-51   Recommendations: NONE - Unable to reach daughter  Follow-up plan: ICM clinic phone appointment on 11/04/2016.  HF office appointment on 10/15/2016 and BMET on 10/14/2016  Copy of ICM check sent to primary cardiologist and device physician.   3 month ICM trend: 10/12/2016   1  Year ICM trend:      Andre Billings, RN 10/12/2016 7:47 AM

## 2016-10-14 ENCOUNTER — Other Ambulatory Visit (HOSPITAL_COMMUNITY): Payer: Medicare Other

## 2016-10-15 ENCOUNTER — Ambulatory Visit (HOSPITAL_COMMUNITY)
Admission: RE | Admit: 2016-10-15 | Discharge: 2016-10-15 | Disposition: A | Discharge status: Home / Self Care | Payer: Medicare Other | Source: Ambulatory Visit | Attending: Cardiology | Admitting: Cardiology

## 2016-10-15 ENCOUNTER — Other Ambulatory Visit (HOSPITAL_COMMUNITY): Payer: Self-pay | Admitting: Cardiology

## 2016-10-15 VITALS — BP 100/64 | HR 77 | Wt 139.0 lb

## 2016-10-15 DIAGNOSIS — I714 Abdominal aortic aneurysm, without rupture: Secondary | ICD-10-CM | POA: Insufficient documentation

## 2016-10-15 DIAGNOSIS — Z95810 Presence of automatic (implantable) cardiac defibrillator: Secondary | ICD-10-CM | POA: Diagnosis not present

## 2016-10-15 DIAGNOSIS — I2510 Atherosclerotic heart disease of native coronary artery without angina pectoris: Secondary | ICD-10-CM | POA: Insufficient documentation

## 2016-10-15 DIAGNOSIS — Z8249 Family history of ischemic heart disease and other diseases of the circulatory system: Secondary | ICD-10-CM | POA: Insufficient documentation

## 2016-10-15 DIAGNOSIS — I739 Peripheral vascular disease, unspecified: Secondary | ICD-10-CM

## 2016-10-15 DIAGNOSIS — D649 Anemia, unspecified: Secondary | ICD-10-CM | POA: Diagnosis not present

## 2016-10-15 DIAGNOSIS — I340 Nonrheumatic mitral (valve) insufficiency: Secondary | ICD-10-CM | POA: Diagnosis not present

## 2016-10-15 DIAGNOSIS — E785 Hyperlipidemia, unspecified: Secondary | ICD-10-CM | POA: Insufficient documentation

## 2016-10-15 DIAGNOSIS — I252 Old myocardial infarction: Secondary | ICD-10-CM | POA: Diagnosis not present

## 2016-10-15 DIAGNOSIS — I509 Heart failure, unspecified: Secondary | ICD-10-CM

## 2016-10-15 DIAGNOSIS — R531 Weakness: Secondary | ICD-10-CM | POA: Diagnosis not present

## 2016-10-15 DIAGNOSIS — Z951 Presence of aortocoronary bypass graft: Secondary | ICD-10-CM | POA: Insufficient documentation

## 2016-10-15 DIAGNOSIS — I255 Ischemic cardiomyopathy: Secondary | ICD-10-CM | POA: Diagnosis not present

## 2016-10-15 DIAGNOSIS — Z79899 Other long term (current) drug therapy: Secondary | ICD-10-CM | POA: Insufficient documentation

## 2016-10-15 DIAGNOSIS — Z7982 Long term (current) use of aspirin: Secondary | ICD-10-CM | POA: Insufficient documentation

## 2016-10-15 DIAGNOSIS — I2582 Chronic total occlusion of coronary artery: Secondary | ICD-10-CM | POA: Diagnosis not present

## 2016-10-15 DIAGNOSIS — I25810 Atherosclerosis of coronary artery bypass graft(s) without angina pectoris: Secondary | ICD-10-CM

## 2016-10-15 DIAGNOSIS — Z006 Encounter for examination for normal comparison and control in clinical research program: Secondary | ICD-10-CM

## 2016-10-15 DIAGNOSIS — I5022 Chronic systolic (congestive) heart failure: Secondary | ICD-10-CM | POA: Diagnosis not present

## 2016-10-15 DIAGNOSIS — Z7902 Long term (current) use of antithrombotics/antiplatelets: Secondary | ICD-10-CM | POA: Insufficient documentation

## 2016-10-15 DIAGNOSIS — I350 Nonrheumatic aortic (valve) stenosis: Secondary | ICD-10-CM | POA: Insufficient documentation

## 2016-10-15 DIAGNOSIS — Z9889 Other specified postprocedural states: Secondary | ICD-10-CM | POA: Diagnosis present

## 2016-10-15 LAB — BASIC METABOLIC PANEL
Anion gap: 9 (ref 5–15)
BUN: 32 mg/dL — ABNORMAL HIGH (ref 6–20)
CALCIUM: 9.5 mg/dL (ref 8.9–10.3)
CO2: 23 mmol/L (ref 22–32)
CREATININE: 2.07 mg/dL — AB (ref 0.61–1.24)
Chloride: 106 mmol/L (ref 101–111)
GFR, EST AFRICAN AMERICAN: 34 mL/min — AB (ref >60)
GFR, EST NON AFRICAN AMERICAN: 29 mL/min — AB (ref >60)
GLUCOSE: 95 mg/dL (ref 65–99)
Potassium: 4.6 mmol/L (ref 3.5–5.1)
Sodium: 138 mmol/L (ref 135–145)

## 2016-10-15 LAB — DIGOXIN LEVEL: Digoxin Level: 1.2 ng/mL (ref 0.8–2.0)

## 2016-10-15 LAB — TSH: TSH: 2.69 u[IU]/mL (ref 0.350–4.500)

## 2016-10-15 LAB — BRAIN NATRIURETIC PEPTIDE: B NATRIURETIC PEPTIDE 5: 1491 pg/mL — AB (ref 0.0–100.0)

## 2016-10-15 MED ORDER — FUROSEMIDE 40 MG PO TABS: 40.0000 mg | ORAL_TABLET | Freq: Every day | ORAL | 3 refills | Status: DC

## 2016-10-15 NOTE — Progress Notes (Signed)
Patient ID: Wardell Pokorski, male   DOB: 12-Jun-1936, 81 y.o.   MRN: [id number]   PCP: Dr. Arthor Captain Cardiology: Dr. Shirlee Latch  81 yo with history of CAD s/p CABG, ischemic cardiomyopathy, and recent GI bleed presents for cardiology followup after hospitalization.  Patient has been followed in the past in [place].  He had CABG back in 1987.  He had an admission in [place] in 3/17 with GI bleeding.  Per patient and daughter, bleeding was severe but he did not have endoscopy.  He is back on ASA 81 and Plavix.    He was admitted to [facility] in 4/17 with acute on chronic systolic CHF.  After hospitalization, he had a VT arrest.  Echo showed EF 25-30% with moderately decreased RV function, probably moderate AS, and mod-severe MR.  LHC was done, showing 3/4 occluded bypass grafts.  LIMA-LAD was patent but had 70-75% anastomotic stenosis.  He was medically managed. RHC showed near-normal filling pressures.  St Jude ICD was placed.   Admitted 01/18/16 with anterior STEMI, TnI to 45.  Repeat LHC showed similar anatomy to 4/17 LHC with diffusely diseased RCA, totally occluded LCx and LAD, and 75% anastomotic lesion at LIMA touchdown. Touchdown lesion was likely the culprit. His last 2 admissions were likely ischemia-related, VT arrest in 4/17 and anterior STEMI in 5/17. He had successful PCI to the LIMA-LAD anastomotic lesion on 01/20/16. Discharge weight was 140 pounds.  RHC in 6/17 showed preserved cardiac output and normal filling pressures.   He had peripheral angiography in 8/17 showing occluded bilateral SFAs and significant bilateral CIA disease.  No good percutaneous options, and probably a poor candidate for aortobifemoral bypass.  He has been following with Dr. Kirke Corin.   He presents today for regular followup.  Stable from last visit.  Digoxin level recently high, dose has been decreased.  Now in [place] trial. Mostly limited by leg pains (calf and thigh) and back pain.  No dyspnea walking  around house. Uses walker outside house.  Dyspnea with moderate exertion and sometimes with dressing.  No chest pain.  BP remains soft but no lightheadedness.  Ankles swell by evening.  Weight down.  No palpitations.   Corevue: Evaluation suggests recent volume overload with lower impedance.   Labs (4/17): K 4.5, creatinine 1.42, LDL 120, HCT 31.7 Labs (5/17): K 4.3, creatinine 1.49, BNP 645, TSH normal, HCT 38.8, LFTs normal Labs (01/27/2016):  K 5.2, Creatinine 1.68, BNP 688, Hgb 10.9  Labs (8/17): K 5.1, creatinine 1.4, hgb 10.6 Labs (12/17): LDL 77, HDL 42 Labs (2/18): K 137, K 5.3, creatinine 1.89, LFTs normal, hgb 10.1  PMH: 1. CAD: S/p CABG 1987.  - LHC (4/17) with total occlusion of vein grafts x 3.  LIMA-LAD patent with 70-75% anastomotic stenosis.  Total occlusion of the LAD, LCx, and diffuse disease throughout the RCA.  He was managed medically.  - Admission 5/17 with anterior STEMI, troponin to 45.  LHC in 5/17 showed similar anatomy to 4/17 with diffusely diseased RCA, totally occluded LCx and LAD, and 75% anastomotic lesion at LIMA touchdown. Touchdown lesion was likely the culprit. He had successful PCI to the LIMA-LAD anastomotic lesion on 01/20/16. 2. Chronic systolic CHF: Ischemic cardiomyopathy.  St Jude ICD.  - Echo (4/17): EF 25-30%, inferolateral akinesis, moderately decreased RV systolic function, ?moderate aortic stenosis but mean gradient 10 mmHg, moderate to severe MR.  - RHC (4/17): mean RA 2, PA 42/14, mean PCWP 15, CI 2.09. - Echo (5/17): EF 25-30%,  anteroseptal AK, mild AS, mild AI, moderate MR, PASP 38 mmHg.  - RHC (6/17): mean RA 1, PA 41/14 mean 24, mean PCWP 10, CI 2.33, PVR 3.3 WU.  3. History of GI bleeding: 3/17.  Per report, bleeding was severe, but he did not have endoscopy done.   4. PAD: 11/16 cath showed occluded bilateral SFAs.   - Lower extremity dopplers (5/17) with bilateral SFA occlusions, > 50% left external iliac stenosis.  - PV angiography in  8/17 with totally occluded bilateral SFAs, significant bilateral CIA disease => medical treatment.  5. VT: 4/17 admit with VT, loss of consciousness.  6. Hyperlipidemia 7. Aortic stenosis: Probably moderate on 4/17 echo, mild on 5/17 echo.  8. Mitral regurgitation: Moderate to severe on 4/17 echo, suspect infarct-related.  Moderate on 5/17 echo.  9. AAA: Abdominal US 5/17 with 4.5 cm AAA.  10. 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  Nonsmoker (since college years), no ETOH.   FH: CAD  ROS: All systems reviewed and negative except as per HPI.   Current Outpatient Prescriptions  Medication Sig Dispense Refill  . amiodarone (PACERONE) 200 MG tablet Take 1 tablet (200 mg total) by mouth daily. 30 tablet 3  . aspirin EC 81 MG EC tablet Take 1 tablet (81 mg total) by mouth daily. 30 tablet 3  . clopidogrel (PLAVIX) 75 MG tablet Take 1 tablet (75 mg total) by mouth daily. 30 tablet 3  . digoxin (DIGITEK) 0.125 MG tablet Take 0.5 tablets (0.0625 mg total) by mouth daily. 15 tablet 5  . ezetimibe (ZETIA) 10 MG tablet Take 1 tablet (10 mg total) by mouth daily. 30 tablet 1  . ferrous sulfate 325 (65 FE) MG EC tablet Take 1 tablet (325 mg total) by mouth 3 (three) times daily with meals. 90 tablet 3  . folic acid (FOLVITE) 1 MG tablet take 1 tablet by mouth once daily 30 tablet 3  . furosemide (LASIX) 40 MG tablet Take 1 tablet (40 mg total) daily alternating with 0.5 tablet (20 mg total) daily. 90 tablet 3  . isosorbide-hydrALAZINE (BIDIL) 20-37.5 MG tablet Take 0.5 tablets by mouth 3 (three) times daily. 45 tablet 3  . latanoprost (XALATAN) 0.005 % ophthalmic solution Place 1 drop into both eyes at bedtime.  0  . linaclotide (LINZESS) 145 MCG CAPS capsule Take 1 capsule (145 mcg total) by mouth daily as needed (for constipation). Reported on 01/01/2016 30 capsule 3  . nitroGLYCERIN (NITROSTAT) 0.4 MG SL tablet Place 0.4 mg under the tongue every 5  (five) minutes as needed for chest pain.    Marland Kitchen. oxyCODONE-acetaminophen (PERCOCET/ROXICET) 5-325 MG tablet Take 1 tablet by mouth every 6 (six) hours as needed for severe pain. 10 tablet 0  . promethazine (PHENERGAN) 12.5 MG tablet Take 1 tablet (12.5 mg total) by mouth every 6 (six) hours as needed for nausea or vomiting. 30 tablet 0  . ranolazine (RANEXA) 500 MG 12 hr tablet Take 1 tablet (500 mg total) by mouth 2 (two) times daily. 60 tablet 5  . rosuvastatin (CRESTOR) 40 MG tablet Take 1 tablet (40 mg total) by mouth daily. 90 tablet 3  . spironolactone (ALDACTONE) 25 MG tablet take 1/2 tablets by mouth every morning 15 tablet 1  . timolol (TIMOPTIC) 0.5 % ophthalmic solution Place 1 drop into both eyes 2 (two) times daily.  0  . traZODone (DESYREL) 50 MG tablet Take 1 tablet (50 mg total) by mouth at  bedtime as needed for sleep. 90 tablet 3  . vitamin C (ASCORBIC ACID) 500 MG tablet Take 1 tablet (500 mg total) by mouth 2 (two) times daily. 60 tablet 3   No current facility-administered medications for this encounter.    BP 100/64 (BP Location: Left Arm, Patient Position: Sitting, Cuff Size: Normal)   Pulse 77   Wt 139 lb (63 kg)   SpO2 100%   BMI 20.53 kg/m    Wt Readings from Last 3 Encounters:  10/15/16 139 lb (63 kg)  10/01/16 139 lb 6.4 oz (63.2 kg)  09/23/16 138 lb 9.6 oz (62.9 kg)   General: Elderly appearing. Arrived in [facility].  Neck: JVP 8-9 cm, no thyromegaly or thyroid nodule.  Lungs: CTAB, normal effort.  CV: Nondisplaced PMI.  Heart regular S1/S2, no S3/S4, 2/6 SEM RUSB with clear S2, 2/6 HSM apex. 1+ ankle edema. No carotid bruit.  Unable to palpate pedal pulses.  Abdomen: Soft, NT, ND, no HSM. No bruits or masses. +BS   Skin: Intact without lesions or rashes.  Neurologic: Alert and oriented x 3.  Psych: Normal affect. Extremities: No clubbing or cyanosis.  HEENT: Normal.   Assessment/Plan: 1. CAD: s/p CABG 1987.  4/17 cath showed all SVGs occluded.  The LIMA-LAD had  a 70-75% anastomotic lesion.  Readmitted with anterior STEMI in 5/17.  Repeat cath showed similar anatomy to 4/17 cath.  Had PCI 01/20/16 to LIMA-LAD anastomotic lesion.  No further chest pain.  - Continue ASA 81 and Plavix.  - Continue Crestor, good lipids recently.   - He can continue Ranexa.  2. Chronic systolic CHF: Ischemic cardiomyopathy.  EF 20-25% on 4/17 echo, 25-30% on 5/17 echo. Has St Jude ICD.  Mild volume overload by exam and Corevue.  NYHA class III though most limited by leg and back pain.  - Off coreg due to profound fatigue with beta blocker, improved off beta blocker.  - Continue digoxin at lower dose, check level today.     - Continue Bidil 1/2 tab tid. No up-titration with soft pressures.  - Continue spironolactone 12.5 mg daily, will not titrate up with borderline high K.  Repeat BMET today.  - Increase Lasix to 40 mg daily.  BMET in 10 days.  - Continue Galactic study drug.  3. PAD: Leg weakness/pain is a major complaint.  Peripheral angiography by Dr Kirke Corin in 8/17, report above.  Plan medical management => no percutaneous option and very high risk for aortobifemoral bypass.  4. VT: Likely scar-mediated.  He is now on amiodarone.  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  - Continue amiodarone 200 mg daily.   - Recent normal LFTs, check TSH today. Will need regular eye exams with amiodarone use.  5. Anemia: GI bleeding of uncertain etiology in 3/17. CBC stable.   6. Aortic stenosis: Appeared mild on last echo.  7. Mitral regurgitation: Moderate on last echo, likely infarct-related.  8. AAA: 4.5 cm in 5/17.  Due for repeat US, will arrange.    Followup in 6 wks.   Marca Ancona 10/15/2016 11:28 AM

## 2016-10-15 NOTE — Progress Notes (Addendum)
[facility]. Patient verbalizes he feels good today, and perhaps a touch stronger. Denies signs or symptoms of ACS. States "I have some increased fluid, to which my breathing is sensitive, it does become shorter, this has been the norm since diagnosed with heart failure. I have not hand any chest pain or increased fatigue." Dr. Shirlee LatchMcLean is working with me to get the right dose of fluid medication.I did take an increased dose about 10 days ago and my shortness of breath, my breathing was quickly back to normal."  Compliant with IP. IP held this am as PK drawn this visit and then IP administered. Returned Empty box [id number], [id number]. Will return in 2 weeks for study visit Week 4.

## 2016-10-15 NOTE — Patient Instructions (Signed)
Labs today (will call for abnormal results, otherwise no news is good news)  Increase Lasix to 40 mg (1 Tablet) Once Daily  Abdominal Ultrasounds has been ordered for you.  Lab work in 2 weeks (BMET)  Follow up in 6 weeks.

## 2016-10-17 ENCOUNTER — Other Ambulatory Visit (HOSPITAL_COMMUNITY): Payer: Self-pay | Admitting: Student

## 2016-10-19 ENCOUNTER — Telehealth (HOSPITAL_COMMUNITY): Payer: Self-pay | Admitting: Vascular Surgery

## 2016-10-19 NOTE — Telephone Encounter (Signed)
Pt daughter left message with answering service COMPLETELY OUT OF TWO OF HIS MEDICATIONS /NOT     AN EMERGENCY / CAN BE DONE 10/19/16

## 2016-10-20 ENCOUNTER — Ambulatory Visit (HOSPITAL_COMMUNITY): Payer: Medicare Other

## 2016-10-22 ENCOUNTER — Other Ambulatory Visit: Payer: Self-pay | Admitting: Cardiology

## 2016-10-28 ENCOUNTER — Other Ambulatory Visit (HOSPITAL_COMMUNITY): Payer: Medicare Other

## 2016-10-29 ENCOUNTER — Telehealth (HOSPITAL_COMMUNITY): Payer: Self-pay | Admitting: *Deleted

## 2016-10-29 ENCOUNTER — Ambulatory Visit (HOSPITAL_COMMUNITY)
Admission: RE | Admit: 2016-10-29 | Discharge: 2016-10-29 | Disposition: A | Discharge status: Home / Self Care | Payer: Medicare Other | Source: Ambulatory Visit | Attending: Cardiology | Admitting: Cardiology

## 2016-10-29 DIAGNOSIS — I714 Abdominal aortic aneurysm, without rupture: Secondary | ICD-10-CM | POA: Insufficient documentation

## 2016-10-29 DIAGNOSIS — I509 Heart failure, unspecified: Secondary | ICD-10-CM

## 2016-10-29 DIAGNOSIS — Z006 Encounter for examination for normal comparison and control in clinical research program: Secondary | ICD-10-CM

## 2016-10-29 LAB — BASIC METABOLIC PANEL
ANION GAP: 12 (ref 5–15)
BUN: 42 mg/dL — ABNORMAL HIGH (ref 6–20)
CALCIUM: 9.7 mg/dL (ref 8.9–10.3)
CO2: 23 mmol/L (ref 22–32)
Chloride: 102 mmol/L (ref 101–111)
Creatinine, Ser: 2.34 mg/dL — ABNORMAL HIGH (ref 0.61–1.24)
GFR calc non Af Amer: 25 mL/min — ABNORMAL LOW (ref >60)
GFR, EST AFRICAN AMERICAN: 29 mL/min — AB (ref >60)
Glucose, Bld: 84 mg/dL (ref 65–99)
POTASSIUM: 4.9 mmol/L (ref 3.5–5.1)
Sodium: 137 mmol/L (ref 135–145)

## 2016-10-29 MED ORDER — FUROSEMIDE 40 MG PO TABS: ORAL_TABLET | ORAL | 3 refills | Status: DC

## 2016-10-29 NOTE — Telephone Encounter (Signed)
-----   Message from Laurey Moralealton S McLean, MD sent at 10/29/2016  3:35 PM EST ----- Decrease back to 40 daily alternating with 20 mg daily.  BMET 1 week.

## 2016-10-29 NOTE — Telephone Encounter (Signed)
Notes Recorded by Modesta MessingJasmine Q Kameah Rawl, CMA on 10/29/2016 at 4:08 PM EST Spoke with Toniann FailWendy gave medication changes and lab results on lab schedule next fri. ------  Notes Recorded by Laurey Moralealton S McLean, MD on 10/29/2016 at 3:35 PM EST Decrease back to 40 daily alternating with 20 mg daily. BMET 1 week.    Ref Range & Units 11:56 2wk ago 72mo ago   Sodium 135 - 145 mmol/L 137  138  138    Potassium 3.5 - 5.1 mmol/L 4.9  4.6  5.3     Chloride 101 - 111 mmol/L 102  106  104    CO2 22 - 32 mmol/L 23  23  25     Glucose, Bld 65 - 99 mg/dL 84  95  88    BUN 6 - 20 mg/dL 42   32   27     Creatinine, Ser 0.61 - 1.24 mg/dL 1.612.34   0.962.07   0.452.01     Calcium 8.9 - 10.3 mg/dL 9.7  9.5  9.7    GFR calc non Af Amer >60 mL/min 25   29   30      GFR calc Af Amer >60 mL/min 29   34CM   35CM

## 2016-10-31 IMAGING — CR DG CHEST 1V PORT
1 series · 1 of 1 positions shown · non-contrast
Comparison: Frontal and lateral views 12/19/2015

CLINICAL DATA: Chest pain today.

EXAM:
PORTABLE CHEST 1 VIEW

[AP]
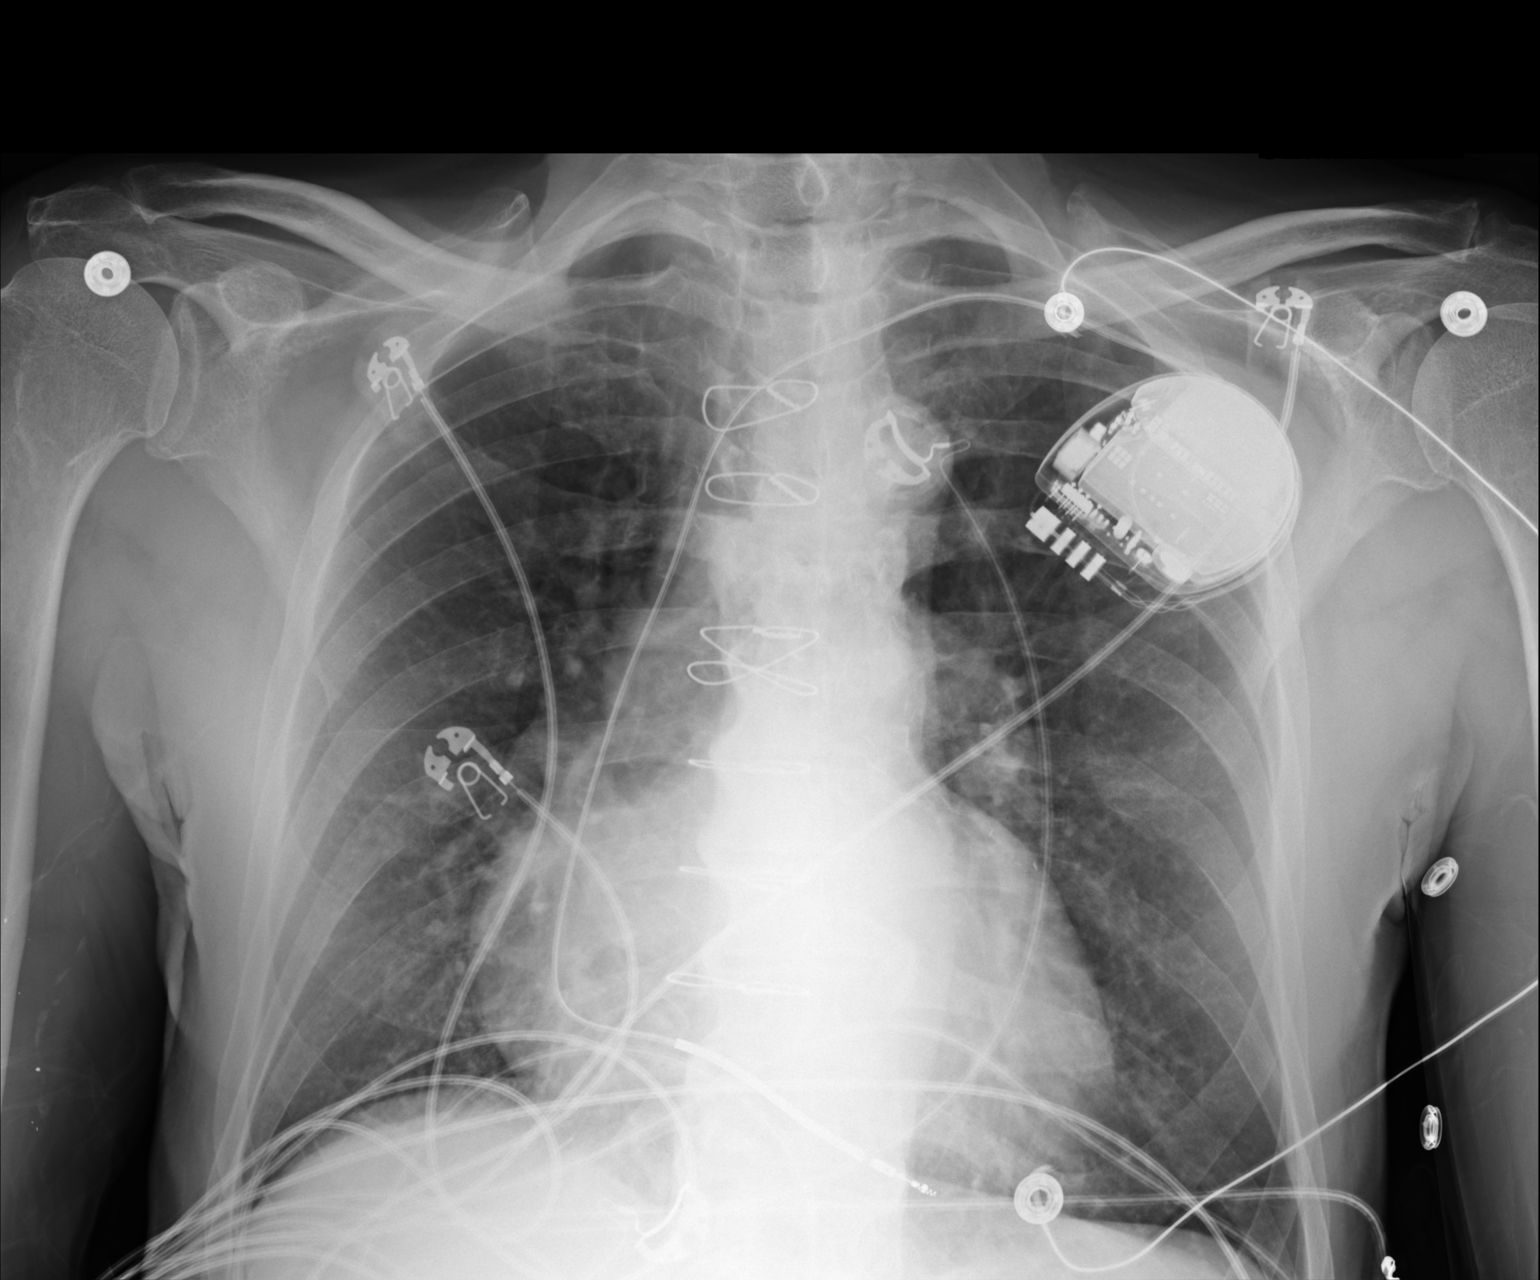

[1 of 1 positions shown; findings below may reference images not displayed]

FINDINGS: Single lead pacemaker remains in place. Patient is post median
sternotomy. The patient is rotated. Heart at the upper limits normal
in size. The left lower lobe consolidation on prior exam is no
longer seen. There is no pulmonary edema. Underlying chronic
interstitial markings again seen. No pneumothorax. Previous pleural
effusions are not seen, left costophrenic angle excluded.
IMPRESSION: Resolution of previous left lung consolidation and pleural
effusions. No new or acute abnormalities are seen.

## 2016-10-31 NOTE — Progress Notes (Signed)
Patient present for [facility] HF Study Week 4 visit. He states he is doing well, other than he continues to feel fatigued. He states he is attempting to do home exercises, but doesn't think he making much progress. Reassured. Feels less short of breath since increase in furosemide. States he just had blood drawn to follow kidney status with use of diuretic. Denied ACS symptoms. Remain NYHA Class III.  Excellent IP compliance, returned boxes and new IP dispensed. No questions or concerned related to the study. Will follow up in 2 weeks for Week 6 study visit.

## 2016-11-02 ENCOUNTER — Telehealth: Payer: Self-pay | Admitting: Family Medicine

## 2016-11-02 DIAGNOSIS — I5023 Acute on chronic systolic (congestive) heart failure: Secondary | ICD-10-CM

## 2016-11-02 NOTE — Telephone Encounter (Signed)
Caller name: Toniann FailWendy Relationship to patient: Daughter Can be reached: [phone]  Pharmacy:  [facility] [place] - [address] [phone] (Phone) [phone] (Fax)     Reason for call: Refills on linaclotide (LINZESS) 145 MCG CAPS capsule   traZODone (DESYREL) 50 MG tablet

## 2016-11-03 ENCOUNTER — Other Ambulatory Visit: Payer: Self-pay | Admitting: Emergency Medicine

## 2016-11-03 DIAGNOSIS — I5023 Acute on chronic systolic (congestive) heart failure: Secondary | ICD-10-CM

## 2016-11-03 MED ORDER — LINACLOTIDE 145 MCG PO CAPS: 145.0000 ug | ORAL_CAPSULE | Freq: Every day | ORAL | 3 refills | Status: AC | PRN

## 2016-11-03 MED ORDER — TRAZODONE HCL 50 MG PO TABS: ORAL_TABLET | ORAL | 3 refills | Status: AC

## 2016-11-04 ENCOUNTER — Ambulatory Visit (INDEPENDENT_AMBULATORY_CARE_PROVIDER_SITE_OTHER): Payer: Medicare Other

## 2016-11-04 DIAGNOSIS — I5022 Chronic systolic (congestive) heart failure: Secondary | ICD-10-CM | POA: Diagnosis not present

## 2016-11-04 DIAGNOSIS — Z95810 Presence of automatic (implantable) cardiac defibrillator: Secondary | ICD-10-CM

## 2016-11-05 ENCOUNTER — Telehealth: Payer: Self-pay

## 2016-11-05 ENCOUNTER — Other Ambulatory Visit (HOSPITAL_COMMUNITY): Payer: Self-pay | Admitting: *Deleted

## 2016-11-05 ENCOUNTER — Ambulatory Visit (HOSPITAL_COMMUNITY)
Admission: RE | Admit: 2016-11-05 | Discharge: 2016-11-05 | Disposition: A | Discharge status: Home / Self Care | Payer: Medicare Other | Source: Ambulatory Visit | Attending: Cardiology | Admitting: Cardiology

## 2016-11-05 DIAGNOSIS — I5022 Chronic systolic (congestive) heart failure: Secondary | ICD-10-CM

## 2016-11-05 LAB — BASIC METABOLIC PANEL
ANION GAP: 9 (ref 5–15)
BUN: 31 mg/dL — AB (ref 6–20)
CO2: 23 mmol/L (ref 22–32)
Calcium: 9.3 mg/dL (ref 8.9–10.3)
Chloride: 106 mmol/L (ref 101–111)
Creatinine, Ser: 2.03 mg/dL — ABNORMAL HIGH (ref 0.61–1.24)
GFR calc Af Amer: 34 mL/min — ABNORMAL LOW (ref >60)
GFR, EST NON AFRICAN AMERICAN: 30 mL/min — AB (ref >60)
GLUCOSE: 114 mg/dL — AB (ref 65–99)
Potassium: 4.7 mmol/L (ref 3.5–5.1)
Sodium: 138 mmol/L (ref 135–145)

## 2016-11-05 NOTE — Progress Notes (Signed)
EPIC Encounter for ICM Monitoring  Patient Name: Andre Wilson is a 81 y.o. male Date: 11/05/2016 Primary Care Physican: Lamar Blinks, MD Primary Cardiologist:McLean Electrophysiologist: Curt Bears Dry Weight:unknown       Attempted call to daughter, [name].   Thoracic impedance normal   Prescribed Furosemide to 40 mg daily alternating with 20 mg daily   Labs: 11/05/2016 Creatinine 2.03, BUN 31, Potassium 4.7, Sodium 138, EGFR 30-34 10/29/2016 Creatinine 2.34, BUN 42, Potassium 4.9, Sodium 137, EGFR 25-29  10/15/2016 Creatinine 2.07, BUN 32, Potassium 4.6, Sodium 138, EGFR 29-34  10/02/2016 Creatinine 1.89, BUN 29, Potassium 5.3, Sodium 137, EGFR 42  09/16/2016 Creatinine 2.01, BUN 27, Potassium 5.3, Sodium 138, EGFR 30-35 09/02/2016 Creatinine 2.02, BUN 27, Potassium 4.6, Sodium 138, EGFR 30-35 07/22/2016 Creatinine 1.92, BUN 24, Potassium 5.2, Sodium 139, EGFR 32-37 06/10/2016 Creatinine 1.54, BUN 22, Potassium 5.2, Sodium 137, EGFR 42-48  04/09/2016 Creatinine 1.40, BUN 21, Potassium 5.1, Sodium 139  02/10/2016 Creatinine 1.46, BUN 23, Potassium 5.0, Sodium 132, EGFR 45-52  01/27/2016 Creatinine 1.68, BUN 22, Potassium 5.2, Sodium 135, EGFR 38-44  01/22/2016 Creatinine 1.42, BUN 24, Potassium 4.7, Sodium 135, EGFR 46-53  01/21/2016 Creatinine 1.59, BUN 30, Potassium 4.5, Sodium 136, EGFR 40-47  01/20/2016 Creatinine 1.35, BUN 30, Potassium 4.5, Sodium 135, EGFR 49-57  01/19/2016 Creatinine 1.54, BUN 29, Potassium 4.6, Sodium 135, EGFR 42-48  01/18/2016 Creatinine 1.99, BUN 30, Potassium 5.1, Sodium 137, EGFR 31-36 01/12/2016 Creatinine 1.54, BUN 21, Potassium 4.6, Sodium 139, EGFR 42-48  01/01/2016 Creatinine 1.49, BUN 28, Potassium 4.3, Sodium 135  12/26/2015 Creatinine 1.48, BUN 20, Potassium 4.5, Sodium 136, EGFR 44-51  Recommendations: NONE - Unable to reach daughter  Follow-up plan: ICM clinic phone appointment on 12/09/2016.  Copy of ICM check sent to  device physician.   3 month ICM trend: 11/04/2016   1 Year ICM trend:      Rosalene Billings, RN 11/05/2016 3:12 PM

## 2016-11-05 NOTE — Telephone Encounter (Signed)
Remote ICM transmission received.  Attempted patient call and no answer 

## 2016-11-11 DIAGNOSIS — Z006 Encounter for examination for normal comparison and control in clinical research program: Secondary | ICD-10-CM

## 2016-11-18 ENCOUNTER — Other Ambulatory Visit: Payer: Self-pay | Admitting: Family Medicine

## 2016-11-22 NOTE — Telephone Encounter (Signed)
Daughter called noting pt is out of medication.

## 2016-11-25 DIAGNOSIS — Z006 Encounter for examination for normal comparison and control in clinical research program: Secondary | ICD-10-CM

## 2016-11-25 NOTE — Progress Notes (Signed)
Patient present for [facility] HF Study Visit Week 8. Patient states he continues to lose weight, and he states his appetite is not picking up. Per suggestion of study coordinator he will see his PCP as he may be having early sign of depression from loss of independence. Patient demonstrates good IP adherence. Patient failed to return one of five IP boxes (#[id number]); he will return box at next visit. New IP dispensed.  No adverse events. No signs or symptoms of ACS. He will return in 4 weeks for visit 12. He verbalizes he will call study coordinator with any study related questions.

## 2016-11-26 ENCOUNTER — Encounter: Payer: Self-pay | Admitting: Medical

## 2016-11-26 ENCOUNTER — Ambulatory Visit (INDEPENDENT_AMBULATORY_CARE_PROVIDER_SITE_OTHER): Payer: Medicare Other | Admitting: Medical

## 2016-11-26 ENCOUNTER — Ambulatory Visit (HOSPITAL_BASED_OUTPATIENT_CLINIC_OR_DEPARTMENT_OTHER)
Admission: RE | Admit: 2016-11-26 | Discharge: 2016-11-26 | Disposition: A | Discharge status: Home / Self Care | Payer: Medicare Other | Source: Ambulatory Visit | Attending: Medical | Admitting: Medical

## 2016-11-26 VITALS — BP 97/47 | HR 63 | Temp 97.8°F | Resp 16 | Ht 69.0 in | Wt 132.0 lb

## 2016-11-26 DIAGNOSIS — M8588 Other specified disorders of bone density and structure, other site: Secondary | ICD-10-CM | POA: Insufficient documentation

## 2016-11-26 DIAGNOSIS — R5383 Other fatigue: Secondary | ICD-10-CM

## 2016-11-26 DIAGNOSIS — I714 Abdominal aortic aneurysm, without rupture: Secondary | ICD-10-CM | POA: Insufficient documentation

## 2016-11-26 DIAGNOSIS — G8929 Other chronic pain: Secondary | ICD-10-CM

## 2016-11-26 DIAGNOSIS — I700 Atherosclerosis of aorta: Secondary | ICD-10-CM | POA: Diagnosis not present

## 2016-11-26 DIAGNOSIS — R634 Abnormal weight loss: Secondary | ICD-10-CM

## 2016-11-26 DIAGNOSIS — I509 Heart failure, unspecified: Secondary | ICD-10-CM | POA: Diagnosis not present

## 2016-11-26 DIAGNOSIS — M48061 Spinal stenosis, lumbar region without neurogenic claudication: Secondary | ICD-10-CM | POA: Diagnosis not present

## 2016-11-26 DIAGNOSIS — M47896 Other spondylosis, lumbar region: Secondary | ICD-10-CM | POA: Diagnosis not present

## 2016-11-26 DIAGNOSIS — M545 Low back pain: Secondary | ICD-10-CM | POA: Insufficient documentation

## 2016-11-26 DIAGNOSIS — Z862 Personal history of diseases of the blood and blood-forming organs and certain disorders involving the immune mechanism: Secondary | ICD-10-CM

## 2016-11-26 DIAGNOSIS — F32A Depression, unspecified: Secondary | ICD-10-CM

## 2016-11-26 DIAGNOSIS — F329 Major depressive disorder, single episode, unspecified: Secondary | ICD-10-CM

## 2016-11-26 LAB — COMPREHENSIVE METABOLIC PANEL
ALT: 149 U/L — ABNORMAL HIGH (ref 0–53)
AST: 101 U/L — ABNORMAL HIGH (ref 0–37)
Albumin: 3.9 g/dL (ref 3.5–5.2)
Alkaline Phosphatase: 56 U/L (ref 39–117)
BUN: 28 mg/dL — ABNORMAL HIGH (ref 6–23)
CO2: 27 meq/L (ref 19–32)
Calcium: 9.3 mg/dL (ref 8.4–10.5)
Chloride: 104 mEq/L (ref 96–112)
Creatinine, Ser: 2.19 mg/dL — ABNORMAL HIGH (ref 0.40–1.50)
GFR: 37.57 mL/min — AB (ref >60.00)
GLUCOSE: 96 mg/dL (ref 70–99)
POTASSIUM: 4.1 meq/L (ref 3.5–5.1)
SODIUM: 138 meq/L (ref 135–145)
Total Bilirubin: 0.7 mg/dL (ref 0.2–1.2)
Total Protein: 7.4 g/dL (ref 6.0–8.3)

## 2016-11-26 LAB — CBC WITH DIFFERENTIAL/PLATELET
BASOS ABS: 0 10*3/uL (ref 0.0–0.1)
Basophils Relative: 0.7 % (ref 0.0–3.0)
EOS PCT: 0.7 % (ref 0.0–5.0)
Eosinophils Absolute: 0 10*3/uL (ref 0.0–0.7)
HCT: 31.7 % — ABNORMAL LOW (ref 39.0–52.0)
Hemoglobin: 10.4 g/dL — ABNORMAL LOW (ref 13.0–17.0)
Lymphocytes Relative: 9.5 % — ABNORMAL LOW (ref 12.0–46.0)
Lymphs Abs: 0.5 10*3/uL — ABNORMAL LOW (ref 0.7–4.0)
MCHC: 32.9 g/dL (ref 30.0–36.0)
MCV: 88.8 fl (ref 78.0–100.0)
MONO ABS: 0.7 10*3/uL (ref 0.1–1.0)
MONOS PCT: 13 % — AB (ref 3.0–12.0)
NEUTROS PCT: 76.1 % (ref 43.0–77.0)
Neutro Abs: 3.9 10*3/uL (ref 1.4–7.7)
PLATELETS: 208 10*3/uL (ref 150.0–400.0)
RBC: 3.57 Mil/uL — ABNORMAL LOW (ref 4.22–5.81)
RDW: 16.7 % — ABNORMAL HIGH (ref 11.5–15.5)
WBC: 5.2 10*3/uL (ref 4.0–10.5)

## 2016-11-26 LAB — IRON AND TIBC
%SAT: 11 % — AB (ref 15–60)
IRON: 30 ug/dL — AB (ref 50–180)
TIBC: 277 ug/dL (ref 250–425)
UIBC: 247 ug/dL (ref 125–400)

## 2016-11-26 LAB — LIPASE: Lipase: 12 U/L (ref 11.0–59.0)

## 2016-11-26 LAB — AMYLASE: Amylase: 75 U/L (ref 27–131)

## 2016-11-26 LAB — VITAMIN B12

## 2016-11-26 LAB — FERRITIN: FERRITIN: 179 ng/mL (ref 22.0–322.0)

## 2016-11-26 MED ORDER — SERTRALINE HCL 25 MG PO TABS: 25.0000 mg | ORAL_TABLET | Freq: Every day | ORAL | 0 refills | Status: DC

## 2016-11-26 NOTE — Patient Instructions (Addendum)
For your admitted depression I do want you to start sertraline 25 mg every day. You could also use trazadone at night to help you sleep. Start sertraline first for one week then start trazadone in one week.  Hopefully with increaed mood you will eat more. Supplament your diet as you have been doing at times in the past.  Will get lumbar xray today to assess spine.  Will get labs to evaluate your fatigue in light of your medical history.  Follow up in 2 weeks or as needed

## 2016-11-26 NOTE — Progress Notes (Signed)
Subjective:    Patient ID: Andre Wilson, male    DOB: 1935/08/28, 81 y.o.   MRN: [id number]  HPI  Pt in for evaluation. Pt has recent weight loss. Pt has been feeling fatigued for a while. Pt has hx of chf. Pt cardiologist thinks pt may be depressed. Pt has walker. Over last year chf, MI and GI bleed.   Pt has had lower back pain for years. But recently lower back pain worse. When he stands up and walks has back pain.  Also daughter updates me that he has poor circulation.  They try to keep his diet low sodium.   Pt has boost and ensure at home.  Pt does admit feeling some depressed.  Cardiologist considered prozac or restoril.  Pt is on research medicine or placebo to possibly increase EF.  Pt has trazadone. But has never used.     Review of Systems  Constitutional: Negative for chills, fatigue and unexpected weight change.  HENT: Negative for congestion and facial swelling.   Respiratory: Negative for cough, chest tightness, shortness of breath and wheezing.   Cardiovascular: Negative for chest pain and palpitations.  Gastrointestinal: Negative for abdominal distention, abdominal pain, anal bleeding, diarrhea, nausea and vomiting.  Musculoskeletal: Positive for back pain. Negative for arthralgias, joint swelling, neck pain and neck stiffness.  Skin: Negative for rash.  Neurological: Negative for facial asymmetry and headaches.  Hematological: Does not bruise/bleed easily.  Psychiatric/Behavioral: Positive for dysphoric mood and sleep disturbance. Negative for agitation, behavioral problems, confusion, decreased concentration, self-injury and suicidal ideas. The patient is not nervous/anxious.     Past Medical History:  Diagnosis Date  . Arthritis   . Blood transfusion without reported diagnosis   . CAD (coronary artery disease)   . Cardiac arrest [facility]) secondary to sustained V tach requiring CPR 12/15/15 12/15/2015   s/p St. Jude Ellipse VR (serial Number [id number])  ICD  . Cataract   . CHF (congestive heart failure) (HCC)   . GI bleed   . Glaucoma   . Heart murmur   . Hypercholesteremia   . Hypertension   . MI (myocardial infarction)   . Oxygen deficiency   . Sustained VT (ventricular tachycardia) (HCC) 12/15/2015     Social History   Social History  . Marital status: Married    Spouse name: N/A  . Number of children: N/A  . Years of education: N/A   Occupational History  . Not on file.   Social History Main Topics  . Smoking status: Former Games developer  . Smokeless tobacco: Never Used  . Alcohol use No  . Drug use: No  . Sexual activity: Not on file   Other Topics Concern  . Not on file   Social History Narrative  . No narrative on file    Past Surgical History:  Procedure Laterality Date  . CARDIAC CATHETERIZATION     CABG 1987  . CARDIAC CATHETERIZATION N/A 12/17/2015   Procedure: Right/Left Heart Cath and Coronary/Graft Angiography;  Surgeon: Lyn Records, MD;  Location: [facility] INVASIVE CV LAB;  Service: Cardiovascular;  Laterality: N/A;  . CARDIAC CATHETERIZATION N/A 01/18/2016   Procedure: Left Heart Cath and Coronary Angiography;  Surgeon: Runell Gess, MD;  [facility] INVASIVE CV LAB;  Service: Cardiovascular;  Laterality: N/A;  . CARDIAC CATHETERIZATION N/A 01/20/2016   Procedure: Coronary Stent Intervention;  Surgeon: Peter M Swaziland, MD;  Location: [facility] INVASIVE CV LAB;  Service: Cardiovascular;  Laterality: N/A;  . CARDIAC CATHETERIZATION N/A 02/12/2016  Procedure: Right Heart Cath;  Surgeon: Laurey Morale, MD;  Location: [facility] INVASIVE CV LAB;  Service: Cardiovascular;  Laterality: N/A;  . EP IMPLANTABLE DEVICE N/A 12/18/2015   Procedure: ICD Implant;  Surgeon: Will Jorja Loa, MD; [facility] [place] VR (serial Number [id number]) ICD   . PERIPHERAL VASCULAR CATHETERIZATION N/A 04/14/2016   Procedure: Abdominal Aortogram w/Lower Extremity;  Surgeon: Iran Ouch, MD;  Location: [facility] INVASIVE CV LAB;  Service: Cardiovascular;   Laterality: N/A;  . ULTRASOUND GUIDANCE FOR VASCULAR ACCESS  01/20/2016   Procedure: Ultrasound Guidance For Vascular Access;  Surgeon: Peter M Swaziland, MD;  Location: [facility] INVASIVE CV LAB;  Service: Cardiovascular;;    Family History  Problem Relation Age of Onset  . Hypertension Mother   . Hyperlipidemia Mother   . Heart attack Father   . Aneurysm Brother   . Heart disease Brother   . Hypertension Brother   . Heart disease Brother   . Hypertension Brother     No Known Allergies  Current Outpatient Prescriptions on File Prior to Visit  Medication Sig Dispense Refill  . amiodarone (PACERONE) 200 MG tablet Take 1 tablet (200 mg total) by mouth daily. 30 tablet 3  . aspirin EC 81 MG EC tablet Take 1 tablet (81 mg total) by mouth daily. 30 tablet 3  . clopidogrel (PLAVIX) 75 MG tablet Take 1 tablet (75 mg total) by mouth daily. 30 tablet 3  . digoxin (DIGITEK) 0.125 MG tablet Take 0.5 tablets (0.0625 mg total) by mouth daily. 15 tablet 5  . ezetimibe (ZETIA) 10 MG tablet take 1 tablet by mouth once daily 90 tablet 1  . ferrous sulfate 325 (65 FE) MG EC tablet Take 1 tablet (325 mg total) by mouth 3 (three) times daily with meals. 90 tablet 3  . folic acid (FOLVITE) 1 MG tablet take 1 tablet by mouth once daily 30 tablet 3  . furosemide (LASIX) 40 MG tablet Take  every other day alternating with  every other day 30 tablet 3  . Investigational - Study Medication Take 1 tablet by mouth 2 (two) times daily. Enrolled in [employer] Clinical Trial - Omecamtiv Mecarbil or Placebo    . isosorbide-hydrALAZINE (BIDIL) 20-37.5 MG tablet Take 0.5 tablets by mouth 3 (three) times daily. 45 tablet 3  . latanoprost (XALATAN) 0.005 % ophthalmic solution Place 1 drop into both eyes at bedtime.  0  . linaclotide (LINZESS) 145 MCG CAPS capsule Take 1 capsule (145 mcg total) by mouth daily as needed (for constipation). Reported on 01/01/2016 30 capsule 3  . nitroGLYCERIN (NITROSTAT) 0.4 MG SL tablet Place  0.4 mg under the tongue every 5 (five) minutes as needed for chest pain.    Marland Kitchen oxyCODONE-acetaminophen (PERCOCET/ROXICET) 5-325 MG tablet Take 1 tablet by mouth every 6 (six) hours as needed for severe pain. 10 tablet 0  . promethazine (PHENERGAN) 12.5 MG tablet Take 1 tablet (12.5 mg total) by mouth every 6 (six) hours as needed for nausea or vomiting. 30 tablet 0  . RANEXA 500 MG 12 hr tablet take 1 tablet by mouth twice a day 60 tablet 5  . spironolactone (ALDACTONE) 25 MG tablet take 1/2 tablets by mouth every morning 15 tablet 1  . timolol (TIMOPTIC) 0.5 % ophthalmic solution Place 1 drop into both eyes 2 (two) times daily.  0  . traZODone (DESYREL) 50 MG tablet Take 1/2 or 1 at bedtime as needed for sleep 90 tablet 3  . vitamin C (ASCORBIC ACID) 500  MG tablet Take 1 tablet (500 mg total) by mouth 2 (two) times daily. 60 tablet 3  . rosuvastatin (CRESTOR) 40 MG tablet Take 1 tablet (40 mg total) by mouth daily. 90 tablet 3   No current facility-administered medications on file prior to visit.     BP (!) 97/47 (BP Location: Left Arm, Patient Position: Sitting, Cuff Size: Normal)   Pulse 63   Temp 97.8 F (36.6 C) (Oral)   Resp 16   Ht  (1.753 m)   Wt 132 lb (59.9 kg)   SpO2 100%   BMI 19.49 kg/m       Objective:   Physical Exam  General Mental Status- Alert. General Appearance- Not in acute distress.   Skin General: Color- Normal Color. Moisture- Normal Moisture.  Neck Carotid Arteries- Normal color. Moisture- Normal Moisture. No carotid bruits. No JVD.  Chest and Lung Exam Auscultation: Breath Sounds:-Normal.  Cardiovascular Auscultation:Rythm- Regular. Murmurs & Other Heart Sounds:Auscultation of the heart reveals- No Murmurs.  Abdomen Inspection:-Inspeection Normal. Palpation/Percussion:Note:No mass. Palpation and Percussion of the abdomen reveal- Non Tender, Non Distended + BS, no rebound or guarding.    Neurologic Cranial Nerve exam:- CN III-XII  intact(No nystagmus), symmetric smile. Strength:- 5/5 equal and symmetric strength both upper and lower extremities.  Lower ext- no pedal edema.Calves symmetric.      Assessment & Plan:  For your admitted depression I do want you to start sertraline 25 mg every day. You could also use trazadone at night to help you sleep. Start sertraline first for one week then start trazadone in one week.  Hopefully with increaed mood you will eat more. Supplament your diet as you have been doing at times in the past.  Will get lumbar xray today to assess spine.  Will get labs to evaluate your fatigue in light of your medical history.  Follow up in 2 weeks or as needed  Discussed with pharmacist. No interaction between sertraline and amiodorone.

## 2016-11-26 NOTE — Progress Notes (Signed)
Pre visit review using our clinic review tool, if applicable. No additional management support is needed unless otherwise documented below in the visit note. 

## 2016-11-30 ENCOUNTER — Telehealth: Payer: Self-pay | Admitting: Medical

## 2016-11-30 MED ORDER — TRAMADOL HCL 50 MG PO TABS: ORAL_TABLET | ORAL | 0 refills | Status: DC

## 2016-11-30 NOTE — Telephone Encounter (Signed)
We will send in rx of tramadol to his pharmacy.. Advise pt daughter and pt that we will  giving very low dose to avoid side effects. Low dose and only one time a day.  Will sign rx and can fax it over tomorrow.

## 2016-12-01 ENCOUNTER — Inpatient Hospital Stay (HOSPITAL_COMMUNITY): Admission: RE | Admit: 2016-12-01 | Payer: Medicare Other | Source: Ambulatory Visit

## 2016-12-01 ENCOUNTER — Other Ambulatory Visit (HOSPITAL_COMMUNITY): Payer: Self-pay | Admitting: Student

## 2016-12-01 ENCOUNTER — Telehealth: Payer: Self-pay | Admitting: Medical

## 2016-12-01 NOTE — Telephone Encounter (Signed)
Called daughter. Left message for return call.

## 2016-12-01 NOTE — Telephone Encounter (Signed)
Gave to [facility] so she can fax it over the rx.

## 2016-12-02 ENCOUNTER — Ambulatory Visit (INDEPENDENT_AMBULATORY_CARE_PROVIDER_SITE_OTHER): Payer: Medicare Other | Admitting: Cardiology

## 2016-12-02 ENCOUNTER — Encounter: Payer: Self-pay | Admitting: Cardiology

## 2016-12-02 VITALS — BP 110/56 | HR 66 | Ht 69.0 in | Wt 133.0 lb

## 2016-12-02 DIAGNOSIS — I255 Ischemic cardiomyopathy: Secondary | ICD-10-CM

## 2016-12-02 DIAGNOSIS — Z4502 Encounter for adjustment and management of automatic implantable cardiac defibrillator: Secondary | ICD-10-CM | POA: Diagnosis not present

## 2016-12-02 NOTE — Progress Notes (Signed)
Electrophysiology Office Note   Date:  12/02/2016   ID:  Andre Wilson, DOB 19-Sep-1935, MRN [id number]  PCP:  Abbe Amsterdam, MD  Cardiologist:  Shirlee Latch Primary Electrophysiologist:  Koralee Wedeking Jorja Loa, MD    Chief Complaint  Patient presents with  . Defib check    Ischemic cardiomyopathy     History of Present Illness: Andre Wilson is a 81 y.o. male who presents today for electrophysiology evaluation.   Hx of CAD, cardiac arrest s/p ICD, CHF, MI, HLD, HTN.  ICD placed 11/2015.  He presented to the hospital on 6/1 with chest pain. He had PCI to his LIMA to LAD lesion with good results. An echo at the time showed an EF of 25-30%, mild AS, and moderate MR.    Today, he denies symptoms of palpitations, chest pain, shortness of breath, orthopnea, PND, lower extremity edema, claudication, dizziness, presyncope, syncope, bleeding, or neurologic sequela. The patient is tolerating medications without difficulties and is otherwise without complaint today. He does say that he has a AAA that is being followed in [place] by a Physiological scientist. He has been feeling worse over the past couple weeks. Increased fatigue, shortness of breath, and pain in his legs. He is only able to walk short distances without issue.   Past Medical History:  Diagnosis Date  . Arthritis   . Blood transfusion without reported diagnosis   . CAD (coronary artery disease)   . Cardiac arrest [facility]) secondary to sustained V tach requiring CPR 12/15/15 12/15/2015   s/p St. Jude Ellipse VR (serial Number [id number]) ICD  . Cataract   . CHF (congestive heart failure) (HCC)   . GI bleed   . Glaucoma   . Heart murmur   . Hypercholesteremia   . Hypertension   . MI (myocardial infarction)   . Oxygen deficiency   . Sustained VT (ventricular tachycardia) (HCC) 12/15/2015   Past Surgical History:  Procedure Laterality Date  . CARDIAC CATHETERIZATION     CABG 1987  . CARDIAC CATHETERIZATION N/A 12/17/2015   Procedure:  Right/Left Heart Cath and Coronary/Graft Angiography;  Surgeon: Lyn Records, MD;  [facility] INVASIVE CV LAB;  Service: Cardiovascular;  Laterality: N/A;  . CARDIAC CATHETERIZATION N/A 01/18/2016   Procedure: Left Heart Cath and Coronary Angiography;  Surgeon: Runell Gess, MD;  Location: [facility] INVASIVE CV LAB;  Service: Cardiovascular;  Laterality: N/A;  . CARDIAC CATHETERIZATION N/A 01/20/2016   Procedure: Coronary Stent Intervention;  Surgeon: Peter M Swaziland, MD;  Location: [facility] INVASIVE CV LAB;  Service: Cardiovascular;  Laterality: N/A;  . CARDIAC CATHETERIZATION N/A 02/12/2016   Procedure: Right Heart Cath;  Surgeon: Laurey Morale, MD;  Location: [facility] INVASIVE CV LAB;  Service: Cardiovascular;  Laterality: N/A;  . EP IMPLANTABLE DEVICE N/A 12/18/2015   Procedure: ICD Implant;  Surgeon: Hershell Brandl Jorja Loa, MD; [facility] [place] VR (serial Number [id number]) ICD   . PERIPHERAL VASCULAR CATHETERIZATION N/A 04/14/2016   Procedure: Abdominal Aortogram w/Lower Extremity;  Surgeon: Iran Ouch, MD;  Location: [facility] INVASIVE CV LAB;  Service: Cardiovascular;  Laterality: N/A;  . ULTRASOUND GUIDANCE FOR VASCULAR ACCESS  01/20/2016   Procedure: Ultrasound Guidance For Vascular Access;  Surgeon: Peter M Swaziland, MD;  Location: [facility] INVASIVE CV LAB;  Service: Cardiovascular;;     Current Outpatient Prescriptions  Medication Sig Dispense Refill  . amiodarone (PACERONE) 200 MG tablet Take 1 tablet (200 mg total) by mouth daily. 30 tablet 3  . aspirin EC 81 MG EC tablet Take 1  tablet (81 mg total) by mouth daily. 30 tablet 3  . digoxin (DIGITEK) 0.125 MG tablet Take 0.5 tablets (0.0625 mg total) by mouth daily. 15 tablet 5  . ezetimibe (ZETIA) 10 MG tablet take 1 tablet by mouth once daily 90 tablet 1  . ferrous sulfate 325 (65 FE) MG EC tablet Take 1 tablet (325 mg total) by mouth 3 (three) times daily with meals. 90 tablet 3  . folic acid (FOLVITE) 1 MG tablet take 1 tablet by mouth once daily 30 tablet 3    . furosemide (LASIX) 40 MG tablet Take  every other day alternating with  every other day 30 tablet 3  . Investigational - Study Medication Take 1 tablet by mouth 2 (two) times daily. Enrolled in [employer] Clinical Trial - Omecamtiv Mecarbil or Placebo    . isosorbide-hydrALAZINE (BIDIL) 20-37.5 MG tablet Take 0.5 tablets by mouth 3 (three) times daily. 45 tablet 3  . latanoprost (XALATAN) 0.005 % ophthalmic solution Place 1 drop into both eyes at bedtime.  0  . linaclotide (LINZESS) 145 MCG CAPS capsule Take 1 capsule (145 mcg total) by mouth daily as needed (for constipation). Reported on 01/01/2016 30 capsule 3  . nitroGLYCERIN (NITROSTAT) 0.4 MG SL tablet Place 0.4 mg under the tongue every 5 (five) minutes as needed for chest pain.    . promethazine (PHENERGAN) 12.5 MG tablet Take 1 tablet (12.5 mg total) by mouth every 6 (six) hours as needed for nausea or vomiting. 30 tablet 0  . RANEXA 500 MG 12 hr tablet take 1 tablet by mouth twice a day 60 tablet 5  . rosuvastatin (CRESTOR) 40 MG tablet Take 1 tablet (40 mg total) by mouth daily. 90 tablet 3  . sertraline (ZOLOFT) 25 MG tablet Take 1 tablet (25 mg total) by mouth daily. 30 tablet 0  . spironolactone (ALDACTONE) 25 MG tablet take 1/2 tablet by mouth every morning 15 tablet 6  . timolol (TIMOPTIC) 0.5 % ophthalmic solution Place 1 drop into both eyes 2 (two) times daily.  0  . traMADol (ULTRAM) 50 MG tablet 1/2 tab po q day as needed for pain 5 tablet 0  . traZODone (DESYREL) 50 MG tablet Take 1/2 or 1 at bedtime as needed for sleep 90 tablet 3  . vitamin C (ASCORBIC ACID) 500 MG tablet Take 1 tablet (500 mg total) by mouth 2 (two) times daily. 60 tablet 3   No current facility-administered medications for this visit.     Allergies:   Patient has no known allergies.   Social History:  The patient  reports that he has quit smoking. He has never used smokeless tobacco. He reports that he does not drink alcohol or use drugs.    Family History:  The patient's family history includes Aneurysm in his brother; Heart attack in his father; Heart disease in his brother and brother; Hyperlipidemia in his mother; Hypertension in his brother, brother, and mother.    ROS:  Please see the history of present illness.   Otherwise, review of systems is positive for weight loss, decreased appetite, fatigue, shortness of breath, leg pain, dyspnea on exertion, constipation, nausea, vomiting, depression, back pain, walking problems, headaches.   All other systems are reviewed and negative.    PHYSICAL EXAM: VS:  BP (!) 110/56   Pulse 66   Ht  (1.753 m)   Wt 133 lb (60.3 kg)   SpO2 99%   BMI 19.64 kg/m  , BMI Body mass index  is 19.64 kg/m. GEN: Well nourished, well developed, in no acute distress  HEENT: normal  Neck: no JVD, carotid bruits, or masses Cardiac: RRR; 2/6 murmur at the base, no rubs, or gallops,no edema  Respiratory:  clear to auscultation bilaterally, normal work of breathing GI: soft, nontender, nondistended, + BS MS: no deformity or atrophy  Skin: warm and dry,  device pocket is well healed Neuro:  Strength and sensation are intact Psych: euthymic mood, full affect  EKG:  EKG is not ordered today. Personal review of the ekg ordered 06/10/17 shows sinus rhythm, rate 75, inferior/lateral TWI, LVHV   Device interrogation is reviewed by me today in detail.  See PaceArt for details.   Recent Labs: 12/19/2015: Magnesium 2.0 10/15/2016: B Natriuretic Peptide 1,491.0; TSH 2.690 11/26/2016: ALT 149; BUN 28; Creatinine, Ser 2.19; Hemoglobin 10.4; Platelets 208.0; Potassium 4.1; Sodium 138    Lipid Panel     Component Value Date/Time   CHOL 129 08/04/2016 0956   TRIG 49 08/04/2016 0956   HDL 42 08/04/2016 0956   CHOLHDL 3.1 08/04/2016 0956   VLDL 10 08/04/2016 0956   LDLCALC 77 08/04/2016 0956     Wt Readings from Last 3 Encounters:  12/02/16 133 lb (60.3 kg)  11/26/16 132 lb (59.9 kg)  11/25/16  130 lb 3.2 oz (59.1 kg)      Other studies Reviewed: Additional studies/ records that were reviewed today include:  Cardiac cath 01/18/16  Ost LAD lesion, 100% stenosed.  Ost Cx to Prox Cx lesion, 100% stenosed.  Dist Graft to Insertion lesion, between Lat 1st Diag and Dist LAD, 75% stenosed. TTE 01/20/16  - Left ventricle: The cavity size was normal. Systolic function was   severely reduced. The estimated ejection fraction was in the   range of 25% to 30%. Diffuse hypokinesis. There is akinesis of   the anteroseptal myocardium. Doppler parameters are consistent   with a reversible restrictive pattern, indicative of decreased   left ventricular diastolic compliance and/or increased left   atrial pressure (grade 3 diastolic dysfunction). - Aortic valve: Trileaflet; moderately thickened, moderately   calcified leaflets. There was mild stenosis. There was mild   regurgitation. - Aortic root: The aortic root was normal in size. - Mitral valve: There was moderate regurgitation directed   eccentrically. - Pulmonary arteries: Systolic pressure was mildly increased. PA   peak pressure: 38 mm Hg (S).   ASSESSMENT AND PLAN:  1.  Ischemic cardiomyopathy: ICD placed 11/2015 after an episode of cardiac arrest. His EKG is stable from his prior. He is feeling weak and fatigued which could simply be a worsening of his heart function. We'll have him follow-up with heart failure. His LFTs are elevated which Sulema Braid be rechecked by his primary physician. He may need to come off of his amiodarone.  2. Coronary artery disease: s/p CABG and had recent PCI to LIMA-LAD lesion. No further episodes of chest pain we'll continue current management.  3. Hypertension: Well-controlled today no changes made  4. Hyperlipidemia: Currently on a statin    Current medicines are reviewed at length with the patient today.   The patient does not have concerns regarding his medicines.  The following changes were made  today:  none  Labs/ tests ordered today include:  No orders of the defined types were placed in this encounter.    Disposition:   FU with Avaleen Brownley 12 months  Signed, Nura Cahoon Jorja Loa, MD  12/02/2016 10:55 AM     [facility]  [address] [place] [address] [phone] (office) [phone] (fax)

## 2016-12-02 NOTE — Telephone Encounter (Signed)
Notified pt's daughter about lose dose of Tramadol. Pt's daughter voiced understanding. Rx faxed to pharmacy [phone]

## 2016-12-02 NOTE — Patient Instructions (Addendum)
Medication Instructions:    Your physician recommends that you continue on your current medications as directed. Please refer to the Current Medication list given to you today.  --- If you need a refill on your cardiac medications before your next appointment, please call your pharmacy. ---  Labwork:  None ordered  Testing/Procedures:  None ordered  Follow-Up: Your physician recommends that you re-schedule a follow-up appointment with Dr. Shirlee Latch in the heart failure clinic -- you missed your appointment yesterday.  Remote monitoring is used to monitor your Pacemaker of ICD from home. This monitoring reduces the number of office visits required to check your device to one time per year. It allows [place] to keep an eye on the functioning of your device to ensure it is working properly. You are scheduled for a device check from home on 03/03/2017. You may send your transmission at any time that day. If you have a wireless device, the transmission will be sent automatically. After your physician reviews your transmission, you will receive a postcard with your next transmission date.   Your physician wants you to follow-up in: 1 year with Dr. Elberta Fortis.  You will receive a reminder letter in the mail two months in advance. If you don't receive a letter, please call our office to schedule the follow-up appointment.   Any Other Special Instructions Will Be Listed Below (If Applicable).   Thank you for choosing [facility]!!   Dory Horn, RN [phone]

## 2016-12-03 ENCOUNTER — Telehealth: Payer: Self-pay

## 2016-12-03 ENCOUNTER — Telehealth: Payer: Self-pay | Admitting: Medical

## 2016-12-03 ENCOUNTER — Ambulatory Visit: Payer: Medicare Other | Admitting: Family Medicine

## 2016-12-03 LAB — CUP PACEART INCLINIC DEVICE CHECK
Brady Statistic RV Percent Paced: 0 %
Date Time Interrogation Session: 20180412145631
HIGH POWER IMPEDANCE MEASURED VALUE: 69.75 Ohm
Implantable Lead Location: 753860
Lead Channel Impedance Value: 475 Ohm
Lead Channel Pacing Threshold Amplitude: 0.75 V
Lead Channel Setting Pacing Amplitude: 2.5 V
Lead Channel Setting Pacing Pulse Width: 0.4 ms
MDC IDC LEAD IMPLANT DT: 20170427
MDC IDC MSMT LEADCHNL RV PACING THRESHOLD PULSEWIDTH: 0.4 ms
MDC IDC MSMT LEADCHNL RV SENSING INTR AMPL: 11.7 mV
MDC IDC PG IMPLANT DT: 20170427
MDC IDC SET LEADCHNL RV SENSING SENSITIVITY: 0.5 mV
Pulse Gen Serial Number: 7338650

## 2016-12-03 NOTE — Telephone Encounter (Signed)
Called patient/daughter with lab results. States patient is currently not taking a B12 suppliment and has not been.

## 2016-12-03 NOTE — Telephone Encounter (Signed)
Pt not on vitamin b12. By the results of high b12  he does not need any supplaments of b12.

## 2016-12-07 ENCOUNTER — Telehealth: Payer: Self-pay | Admitting: *Deleted

## 2016-12-07 NOTE — Telephone Encounter (Signed)
AWV scheduled for 12/09/16 .

## 2016-12-07 NOTE — Telephone Encounter (Signed)
Daughter notified 

## 2016-12-08 ENCOUNTER — Telehealth (HOSPITAL_COMMUNITY): Payer: Self-pay | Admitting: Student

## 2016-12-08 ENCOUNTER — Ambulatory Visit (HOSPITAL_COMMUNITY)
Admission: RE | Admit: 2016-12-08 | Discharge: 2016-12-08 | Disposition: A | Discharge status: Home / Self Care | Payer: Medicare Other | Source: Ambulatory Visit | Attending: Cardiology | Admitting: Cardiology

## 2016-12-08 VITALS — BP 94/44 | HR 66 | Wt 132.1 lb

## 2016-12-08 DIAGNOSIS — I25810 Atherosclerosis of coronary artery bypass graft(s) without angina pectoris: Secondary | ICD-10-CM | POA: Diagnosis not present

## 2016-12-08 DIAGNOSIS — I5022 Chronic systolic (congestive) heart failure: Secondary | ICD-10-CM | POA: Insufficient documentation

## 2016-12-08 DIAGNOSIS — E785 Hyperlipidemia, unspecified: Secondary | ICD-10-CM | POA: Insufficient documentation

## 2016-12-08 DIAGNOSIS — Z951 Presence of aortocoronary bypass graft: Secondary | ICD-10-CM | POA: Diagnosis present

## 2016-12-08 DIAGNOSIS — Z8249 Family history of ischemic heart disease and other diseases of the circulatory system: Secondary | ICD-10-CM | POA: Diagnosis not present

## 2016-12-08 DIAGNOSIS — Z7902 Long term (current) use of antithrombotics/antiplatelets: Secondary | ICD-10-CM | POA: Insufficient documentation

## 2016-12-08 DIAGNOSIS — I714 Abdominal aortic aneurysm, without rupture: Secondary | ICD-10-CM | POA: Diagnosis not present

## 2016-12-08 DIAGNOSIS — I340 Nonrheumatic mitral (valve) insufficiency: Secondary | ICD-10-CM | POA: Insufficient documentation

## 2016-12-08 DIAGNOSIS — D649 Anemia, unspecified: Secondary | ICD-10-CM

## 2016-12-08 DIAGNOSIS — I2582 Chronic total occlusion of coronary artery: Secondary | ICD-10-CM | POA: Diagnosis not present

## 2016-12-08 DIAGNOSIS — I255 Ischemic cardiomyopathy: Secondary | ICD-10-CM | POA: Diagnosis not present

## 2016-12-08 DIAGNOSIS — I2510 Atherosclerotic heart disease of native coronary artery without angina pectoris: Secondary | ICD-10-CM | POA: Insufficient documentation

## 2016-12-08 DIAGNOSIS — Z95810 Presence of automatic (implantable) cardiac defibrillator: Secondary | ICD-10-CM | POA: Diagnosis not present

## 2016-12-08 DIAGNOSIS — R531 Weakness: Secondary | ICD-10-CM | POA: Diagnosis not present

## 2016-12-08 DIAGNOSIS — I252 Old myocardial infarction: Secondary | ICD-10-CM | POA: Insufficient documentation

## 2016-12-08 DIAGNOSIS — I350 Nonrheumatic aortic (valve) stenosis: Secondary | ICD-10-CM

## 2016-12-08 DIAGNOSIS — Z7982 Long term (current) use of aspirin: Secondary | ICD-10-CM | POA: Insufficient documentation

## 2016-12-08 DIAGNOSIS — I10 Essential (primary) hypertension: Secondary | ICD-10-CM

## 2016-12-08 DIAGNOSIS — Z79899 Other long term (current) drug therapy: Secondary | ICD-10-CM | POA: Insufficient documentation

## 2016-12-08 DIAGNOSIS — R944 Abnormal results of kidney function studies: Secondary | ICD-10-CM

## 2016-12-08 NOTE — Progress Notes (Addendum)
Subjective:   Andre Wilson is a 81 y.o. male who presents for an Initial Medicare Annual Wellness Visit. He is here today with his daughter.  He continues to struggle w/ ongoing fatigue-see cardiology note from yesterday. His wife and daughter assist w/ ADLs-bathing/dressing. He does not drive-his daughter brings him to appointments.  The Patient was informed that the wellness visit is to identify future health risk and educate and initiate measures that can reduce risk for increased disease through the lifespan.   Describes health as fair, good or great? "Fair."  Review of Systems  No ROS.  Medicare Wellness Visit.  Cardiac Risk Factors include: advanced age (>23men, >23 women);dyslipidemia;hypertension;male gender  Sleep patterns: has difficulty falling asleep, has restless sleep, gets up 1-2 times nightly to void and sleeps 6 hours nightly.   Home Safety/Smoke Alarms: Feels safe in home. Smoke alarms in place.  Living environment; residence and Firearm Safety: Lives w/ daughter and wife. 1-story house/ trailer, equipment: Development worker, international aid, Type: Tub Dentist. Uses rolling walker at home, wheelchair when going to doctor's appointments/other long distances.   Counseling:   Eye Exam- Follows w/ eye doctor in [place]. Dental- Does not follow w/ dentist regularly since moving to this area. Previously followed w/   Male:   CCS- Aged out.      PSA- Not on file.     Objective:    Today's Vitals   12/09/16 0853  BP: (!) 101/51  Pulse: 62  Resp: 16  Temp: 97.6 F (36.4 C)  TempSrc: Oral  SpO2: 100%  Weight: 131 lb 9.6 oz (59.7 kg)  Height:  (1.753 m)   Body mass index is 19.43 kg/m.  Current Medications (verified) Outpatient Encounter Prescriptions as of 12/09/2016  Medication Sig  . amiodarone (PACERONE) 200 MG tablet Take 1 tablet (200 mg total) by mouth daily.  Marland Kitchen aspirin EC 81 MG EC tablet Take 1 tablet (81 mg total) by mouth daily.  . digoxin (LANOXIN) 0.125  MG tablet Take 1 tab every other day ALTERNATING with 0.5 tab every other day  . ezetimibe (ZETIA) 10 MG tablet take 1 tablet by mouth once daily  . ferrous sulfate 325 (65 FE) MG EC tablet Take 1 tablet (325 mg total) by mouth 3 (three) times daily with meals. (Patient taking differently: Take 325 mg by mouth 2 (two) times daily with a meal. )  . folic acid (FOLVITE) 1 MG tablet take 1 tablet by mouth once daily  . furosemide (LASIX) 40 MG tablet Take  every other day alternating with  every other day  . Investigational - Study Medication Take 1 tablet by mouth 2 (two) times daily. Enrolled in [employer] Clinical Trial - Omecamtiv Mecarbil or Placebo  . isosorbide-hydrALAZINE (BIDIL) 20-37.5 MG tablet Take 0.5 tablets by mouth 3 (three) times daily.  Marland Kitchen latanoprost (XALATAN) 0.005 % ophthalmic solution Place 1 drop into both eyes at bedtime.  Marland Kitchen linaclotide (LINZESS) 145 MCG CAPS capsule Take 1 capsule (145 mcg total) by mouth daily as needed (for constipation). Reported on 01/01/2016  . nitroGLYCERIN (NITROSTAT) 0.4 MG SL tablet Place 0.4 mg under the tongue every 5 (five) minutes as needed for chest pain.  . promethazine (PHENERGAN) 12.5 MG tablet Take 1 tablet (12.5 mg total) by mouth every 6 (six) hours as needed for nausea or vomiting.  Marland Kitchen RANEXA 500 MG 12 hr tablet take 1 tablet by mouth twice a day  . sertraline (ZOLOFT) 25 MG tablet Take 1 tablet (25 mg  total) by mouth daily.  Marland Kitchen spironolactone (ALDACTONE) 25 MG tablet take 1/2 tablet by mouth every morning  . timolol (TIMOPTIC) 0.5 % ophthalmic solution Place 1 drop into both eyes 2 (two) times daily.  . traMADol (ULTRAM) 50 MG tablet 1/2 tab po q day as needed for pain  . traZODone (DESYREL) 50 MG tablet Take 1/2 or 1 at bedtime as needed for sleep  . vitamin C (ASCORBIC ACID) 500 MG tablet Take 1 tablet (500 mg total) by mouth 2 (two) times daily.  . rosuvastatin (CRESTOR) 40 MG tablet Take 1 tablet (40 mg total) by mouth daily.  (Patient not taking: Reported on 12/09/2016)  . [DISCONTINUED] digoxin (DIGITEK) 0.125 MG tablet Take 0.5 tablets (0.0625 mg total) by mouth daily.   No facility-administered encounter medications on file as of 12/09/2016.     Allergies (verified) Patient has no known allergies.   History: Past Medical History:  Diagnosis Date  . Arthritis   . Blood transfusion without reported diagnosis   . CAD (coronary artery disease)   . Cardiac arrest [facility]) secondary to sustained V tach requiring CPR 12/15/15 12/15/2015   s/p St. Jude [place] VR (serial Number [id number]) ICD  . Cataract   . CHF (congestive heart failure) (HCC)   . GI bleed   . Glaucoma   . Heart murmur   . Hypercholesteremia   . Hypertension   . MI (myocardial infarction) (HCC)   . Oxygen deficiency   . Sustained VT (ventricular tachycardia) (HCC) 12/15/2015   Past Surgical History:  Procedure Laterality Date  . CARDIAC CATHETERIZATION     CABG 1987  . CARDIAC CATHETERIZATION N/A 12/17/2015   Procedure: Right/Left Heart Cath and Coronary/Graft Angiography;  Surgeon: Lyn Records, MD;  Location[facility] INVASIVE CV LAB;  Service: Cardiovascular;  Laterality: N/A;  . CARDIAC CATHETERIZATION N/A 01/18/2016   Procedure: Left Heart Cath and Coronary Angiography;  Surgeon: Runell Gess, MD;  Location[facility] INVAS[facility] CV LAB;  Service: Cardiovascular;  Laterality: N/A;  . CARDIAC CATHETERIZATION N/A 01/20/2016   Procedure: Coronary Stent Intervention;  Surgeon: Peter M Swaziland, MD;  Location: [facility] INVASIVE CV LAB;  Service: Cardiovascular;  Laterality: N/A;  . CARDIAC CATHETERIZATION N/A 02/12/2016   Procedure: Right Heart Cath;  Surgeon: Laurey Morale, MD;  Location: [facility] INVASIVE CV LAB;  Service: Cardiovascular;  Laterality: N/A;  . EP IMPLANTABLE DEVICE N/A 12/18/2015   Procedure: ICD Implant;  Surgeon: Will Jorja Loa, MD; [facility] [place] VR (serial Number [id number]) ICD   . PERIPHERAL VASCULAR CATHETERIZATION N/A 04/14/2016    Procedure: Abdominal Aortogram w/Lower Extremity;  Surgeon: Iran Ouch, MD;  Location: [facility] INVASIVE CV LAB;  Service: Cardiovascular;  Laterality: N/A;  . ULTRASOUND GUIDANCE FOR VASCULAR ACCESS  01/20/2016   Procedure: Ultrasound Guidance For Vascular Access;  Surgeon: Peter M Swaziland, MD;  Location: [facility] INVASIVE CV LAB;  Service: Cardiovascular;;   Family History  Problem Relation Age of Onset  . Hypertension Mother   . Hyperlipidemia Mother   . Heart attack Father   . Aneurysm Brother   . Heart disease Brother   . Hypertension Brother   . Heart disease Brother   . Hypertension Brother    Social History   Occupational History  . Not on file.   Social History Main Topics  . Smoking status: Former Games developer  . Smokeless tobacco: Never Used  . Alcohol use No  . Drug use: No  . Sexual activity: Not on file   Tobacco  Counseling Counseling given: Not Answered   Activities of Daily Living In your present state of health, do you have any difficulty performing the following activities: 12/09/2016 04/14/2016  Hearing? N N  Vision? N N  Difficulty concentrating or making decisions? N N  Walking or climbing stairs? Y Y  Dressing or bathing? Y N  Doing errands, shopping? Y -  Quarry manager and eating ? N -  Using the Toilet? N -  In the past six months, have you accidently leaked urine? N -  Do you have problems with loss of bowel control? N -  Managing your Medications? Y -  Managing your Finances? N -  Housekeeping or managing your Housekeeping? Y -  Some recent data might be hidden    Immunizations and Health Maintenance Immunization History  Administered Date(s) Administered  . Influenza-Unspecified 03/24/2015  . Td 11/22/2015   Health Maintenance Due  Topic Date Due  . PNA vac Low Risk Adult (1 of 2 - PCV13) 07/07/2003    Patient Care Team: Pearline Cables, MD as PCP - General (Family Medicine) Laurey Morale, MD as Consulting Physician (Cardiology) Will  Jorja Loa, MD as Consulting Physician (Cardiology) Iran Ouch, MD as Consulting Physician (Cardiology)  Indicate any recent Medical Services you may have received from other than [facility] providers in the past year (date may be approximate).    Assessment:   This is a routine wellness examination for [facility]. Physical assessment deferred to PCP.  Hearing/Vision screen Hearing Screening Comments: Able to hear conversational tones w/o difficulty. No issues reported.  Vision Screening Comments: No vision issues. Wears reading glasses. Follows w/ Dr. Elisabeth Most in [place].  Dietary issues and exercise activities discussed: Current Exercise Habits: Home exercise routine, Type of exercise: strength training/weights  Diet (meal preparation, eat out, water intake, caffeinated beverages, dairy products, fruits and vegetables): in general, a "healthy" diet  , on average, 2 meals per day, low salt. Poor appetite. Drinks Ensure High Protein 1-2x daily.  Goals    . Have 3 meals a day      Depression Screen PHQ 2/9 Scores 12/09/2016 01/01/2016  PHQ - 2 Score 1 0  Exception Documentation - Patient refusal    Fall Risk Fall Risk  12/09/2016 01/01/2016  Falls in the past year? Yes No  Number falls in past yr: 1 -  Injury with Fall? No -    Cognitive Function: MMSE - Mini Mental State Exam 12/09/2016  Orientation to time 5  Orientation to Place 5  Registration 3  Attention/ Calculation 4  Recall 3  Language- name 2 objects 2  Language- repeat 1  Language- follow 3 step command 3  Language- read & follow direction 1  Write a sentence 1  Copy design 1  Total score 29        Screening Tests Health Maintenance  Topic Date Due  . PNA vac Low Risk Adult (1 of 2 - PCV13) 07/07/2003  . INFLUENZA VACCINE  03/23/2017  . TETANUS/TDAP  11/21/2025        Plan:    Follow-up w/ PCP as directed.  Sleep hygiene discussed. Encouraged regular sleep/wake cycle and avoiding daytime  napping.   Bring a copy of your advance directives to your next office visit.  Discussed possibility of HH PT for strength training. Pt declines at this time-he feels like he is doing everything he can as far as activity on his own right now and does not perceive benefit from PT.  Declines pneumococcal vaccine today. He would like to do this at an upcoming visit when he is prepared to get a shot.  Considered [facility] Care Management referral-he is not a candidate due to his insurance.  During the course of the visit [place] was educated and counseled about the following appropriate screening and preventive services:   Vaccines to include Pneumococcal, Influenza, , Td, HCV  Cardiovascular disease screening  Diabetes screening  Glaucoma screening  Nutrition counseling  Patient Instructions (the written plan) were given to the patient.   Starla Link, RN   12/09/2016    I reviewed the above the above. Pt feels mild depressed. Mood is mild decreased. He still reports mild decrease in appetite. He has been on low dose sertraline for 2 weeks. He still feels fatigued. Pt had anemia on last visit. He is now on iron. Also low gfr.(pt does not see a nephrologist).  Very brief discussion with pt today. Not charging separate visit. Increasing his sertraline to 50 mg q day. Will put in cbc, cmp to check his anemia and kidney function.  Follow up with 2 months with Dr. Patsy Lager or as needed with myself.  I reviewed RN noted and agree with assessment and plan.  Saguier, Ramon Dredge, PA-C

## 2016-12-08 NOTE — Patient Instructions (Signed)
Your physician has requested that you have an echocardiogram. Echocardiography is a painless test that uses sound waves to create images of your heart. It provides your doctor with information about the size and shape of your heart and how well your heart's chambers and valves are working. This procedure takes approximately one hour. There are no restrictions for this procedure.  Your physician recommends that you schedule a follow-up appointment in: 2 months  

## 2016-12-08 NOTE — Progress Notes (Signed)
Patient ID: Andre Wilson, male   DOB: [birth year], 81 y.o.   MRN: [id number]   PCP: Dr. Arthor Captain Cardiology: Dr. Shirlee Latch  81 yo with history of CAD s/p CABG, ischemic cardiomyopathy, and recent GI bleed presents for cardiology followup after hospitalization.  Patient has been followed in the past in [place].  He had CABG back in 1987.  He had an admission in [place] in 3/17 with GI bleeding.  Per patient and daughter, bleeding was severe but he did not have endoscopy.  He is back on ASA 81 and Plavix.    He was admitted to [facility] in 4/17 with acute on chronic systolic CHF.  After hospitalization, he had a VT arrest.  Echo showed EF 25-30% with moderately decreased RV function, probably moderate AS, and mod-severe MR.  LHC was done, showing 3/4 occluded bypass grafts.  LIMA-LAD was patent but had 70-75% anastomotic stenosis.  He was medically managed. RHC showed near-normal filling pressures.  St Jude ICD was placed.   Admitted 01/18/16 with anterior STEMI, TnI to 45.  Repeat LHC showed similar anatomy to 4/17 LHC with diffusely diseased RCA, totally occluded LCx and LAD, and 75% anastomotic lesion at LIMA touchdown. Touchdown lesion was likely the culprit. His last 2 admissions were likely ischemia-related, VT arrest in 4/17 and anterior STEMI in 5/17. He had successful PCI to the LIMA-LAD anastomotic lesion on 01/20/16. Discharge weight was 140 pounds.  RHC in 6/17 showed preserved cardiac output and normal filling pressures.   He had peripheral angiography in 8/17 showing occluded bilateral SFAs and significant bilateral CIA disease.  No good percutaneous options, and probably a poor candidate for aortobifemoral bypass.  He has been following with Dr. Kirke Corin.   He presents today for follow up. At last visit lasix increased. Down 7 lbs from that visit. Feeling OK, but very tired and poor appetite. SOB with changing clothes or showering. Gets around with a walker.  Legs and back pain also  limit his walking. No orthopnea or bendopnea. Exhausted after walking from check in to room. Mild lightheadedness with rapid standing but not marked or limiting.  Ankle swelling has been OK. No palpitations. Started on Zoloft for appetite and mood.  Denies fevers, chills, N/V, diarrhea, or near syncope.   Labs (4/17): K 4.5, creatinine 1.42, LDL 120, HCT 31.7 Labs (5/17): K 4.3, creatinine 1.49, BNP 645, TSH normal, HCT 38.8, LFTs normal Labs (01/27/2016):  K 5.2, Creatinine 1.68, BNP 688, Hgb 10.9  Labs (8/17): K 5.1, creatinine 1.4, hgb 10.6 Labs (12/17): LDL 77, HDL 42 Labs (2/18): K 137, K 5.3, creatinine 1.89, LFTs normal, hgb 10.1  PMH: 1. CAD: S/p CABG 1987.  - LHC (4/17) with total occlusion of vein grafts x 3.  LIMA-LAD patent with 70-75% anastomotic stenosis.  Total occlusion of the LAD, LCx, and diffuse disease throughout the RCA.  He was managed medically.  - Admission 5/17 with anterior STEMI, troponin to 45.  LHC in 5/17 showed similar anatomy to 4/17 with diffusely diseased RCA, totally occluded LCx and LAD, and 75% anastomotic lesion at LIMA touchdown. Touchdown lesion was likely the culprit. He had successful PCI to the LIMA-LAD anastomotic lesion on 01/20/16. 2. Chronic systolic CHF: Ischemic cardiomyopathy.  St Jude ICD.  - Echo (4/17): EF 25-30%, inferolateral akinesis, moderately decreased RV systolic function, ?moderate aortic stenosis but mean gradient 10 mmHg, moderate to severe MR.  - RHC (4/17): mean RA 2, PA 42/14, mean PCWP 15, CI 2.09. - Echo (  5/17): EF 25-30%, anteroseptal AK, mild AS, mild AI, moderate MR, PASP 38 mmHg.  - RHC (6/17): mean RA 1, PA 41/14 mean 24, mean PCWP 10, CI 2.33, PVR 3.3 WU.  3. History of GI bleeding: 3/17.  Per report, bleeding was severe, but he did not have endoscopy done.   4. PAD: 11/16 cath showed occluded bilateral SFAs.   - Lower extremity dopplers (5/17) with bilateral SFA occlusions, > 50% left external iliac stenosis.  - PV  angiography in 8/17 with totally occluded bilateral SFAs, significant bilateral CIA disease => medical treatment.  5. VT: 4/17 admit with VT, loss of consciousness.  6. Hyperlipidemia 7. Aortic stenosis: Probably moderate on 4/17 echo, mild on 5/17 echo.  8. Mitral regurgitation: Moderate to severe on 4/17 echo, suspect infarct-related.  Moderate on 5/17 echo.  9. AAA: Abdominal US 5/17 with 4.5 cm AAA.  10. 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  Nonsmoker (since college years), no ETOH.   FH: CAD  Review of systems complete and found to be negative unless listed in HPI.    Current Outpatient Prescriptions  Medication Sig Dispense Refill  . amiodarone (PACERONE) 200 MG tablet Take 1 tablet (200 mg total) by mouth daily. 30 tablet 3  . aspirin EC 81 MG EC tablet Take 1 tablet (81 mg total) by mouth daily. 30 tablet 3  . digoxin (LANOXIN) 0.125 MG tablet Take 1 tab every other day ALTERNATING with 0.5 tab every other day    . ezetimibe (ZETIA) 10 MG tablet take 1 tablet by mouth once daily 90 tablet 1  . ferrous sulfate 325 (65 FE) MG EC tablet Take 1 tablet (325 mg total) by mouth 3 (three) times daily with meals. 90 tablet 3  . folic acid (FOLVITE) 1 MG tablet take 1 tablet by mouth once daily 30 tablet 3  . furosemide (LASIX) 40 MG tablet Take  every other day alternating with  every other day 30 tablet 3  . Investigational - Study Medication Take 1 tablet by mouth 2 (two) times daily. Enrolled in [employer] Clinical Trial - Omecamtiv Mecarbil or Placebo    . isosorbide-hydrALAZINE (BIDIL) 20-37.5 MG tablet Take 0.5 tablets by mouth 3 (three) times daily. 45 tablet 3  . latanoprost (XALATAN) 0.005 % ophthalmic solution Place 1 drop into both eyes at bedtime.  0  . linaclotide (LINZESS) 145 MCG CAPS capsule Take 1 capsule (145 mcg total) by mouth daily as needed (for constipation). Reported on 01/01/2016 30 capsule 3  .  nitroGLYCERIN (NITROSTAT) 0.4 MG SL tablet Place 0.4 mg under the tongue every 5 (five) minutes as needed for chest pain.    . promethazine (PHENERGAN) 12.5 MG tablet Take 1 tablet (12.5 mg total) by mouth every 6 (six) hours as needed for nausea or vomiting. 30 tablet 0  . RANEXA 500 MG 12 hr tablet take 1 tablet by mouth twice a day 60 tablet 5  . rosuvastatin (CRESTOR) 40 MG tablet Take 1 tablet (40 mg total) by mouth daily. 90 tablet 3  . sertraline (ZOLOFT) 25 MG tablet Take 1 tablet (25 mg total) by mouth daily. 30 tablet 0  . spironolactone (ALDACTONE) 25 MG tablet take 1/2 tablet by mouth every morning 15 tablet 6  . timolol (TIMOPTIC) 0.5 % ophthalmic solution Place 1 drop into both eyes 2 (two) times daily.  0  . traMADol (ULTRAM) 50 MG tablet 1/2 tab po q day as needed  for pain 5 tablet 0  . traZODone (DESYREL) 50 MG tablet Take 1/2 or 1 at bedtime as needed for sleep 90 tablet 3  . vitamin C (ASCORBIC ACID) 500 MG tablet Take 1 tablet (500 mg total) by mouth 2 (two) times daily. 60 tablet 3   No current facility-administered medications for this encounter.    BP (!) 94/44   Pulse 66   Wt 132 lb 2 oz (59.9 kg)   SpO2 99%   BMI 19.51 kg/m    Wt Readings from Last 3 Encounters:  12/08/16 132 lb 2 oz (59.9 kg)  12/02/16 133 lb (60.3 kg)  11/26/16 132 lb (59.9 kg)   General: Elderly and fatigued appearing. In WC.  HEENT: Normal.  Neck: supple. JVD 6-7. Carotids 2+ bilat; no bruits. No thyromegaly or nodule noted. Cor: PMI nondisplaced. RRR. 2/6 SEM RUSB with clear S2, 2/6 HSM apex. Trace ankle edema.  Lungs: CTAB, normal effort. Abdomen: Soft, NT, ND, no HSM. No bruits or masses. +BS  Extremities: no cyanosis, clubbing, rash, edema  Neuro: Alert & oriented x 3. Cranial nerves grossly intact. Moves all 4 extremities w/o difficulty. Affect pleasant    Assessment/Plan: 1. CAD: s/p CABG 1987.  4/17 cath showed all SVGs occluded.  The LIMA-LAD had a 70-75% anastomotic lesion.   Readmitted with anterior STEMI in 5/17.  Repeat cath showed similar anatomy to 4/17 cath.  Had PCI 01/20/16 to LIMA-LAD anastomotic lesion.  No further chest pain.  - Continue ASA 81 and Plavix.  - Continue Crestor, good lipids recently.   - He can continue Ranexa.  - No change.  2. Chronic systolic CHF: Ischemic cardiomyopathy.  EF 20-25% on 4/17 echo, 25-30% on 5/17 echo. Has St Jude ICD.  Mild volume overload by exam and Corevue.  NYHA class III though most limited by leg and back pain.  - Off coreg due to profound fatigue with beta blocker, improved off beta blocker. No room to re-challenge.  - Continue digoxin 0.125 mg daily alternating with daily dose of 0.0625 mg daily. Level 1.2 at last visit but was not trough.  - Continue Bidil 1/2 tab tid. No room to up-titrate with soft pressures.  - Continue spironolactone 12.5 mg daily, will not titrate up with borderline high K.   - Continue lasix 40 mg daily. Recent BMET stable.   - Continue [employer] study drug.  3. PAD: Leg weakness/pain is a major complaint.  Peripheral angiography by Dr Kirke Corin in 8/17, report above.  Plan medical management => no percutaneous option and very high risk for aortobifemoral bypass.  4. VT: Likely scar-mediated. Has a St Jude ICD.  - Continue amiodarone 200 mg daily.    - Recent normal LFTs. TSH stable in 09/2016.  - Needs regular eye exams on TSH today. Will need regular eye exams with amiodarone use.  5. Anemia: GI bleeding of uncertain etiology in 3/17.  - CBC stable 2 weeks ago. Iron panel low. On po supp.  6. Aortic stenosis: - Appeared mild on last echo. Repeating Echo.  7. Mitral regurgitation:  - Moderate on last echo, likely infarct-related. Repeating Echo as above.  8. AAA:  - 4.0 cm 10/2016. Repeat 1 year.     Pt fatigue is multifactorial with PAD, systolic CHF, and anemia. Most recently Iron deficient on lab work.  Per PCP. Repeat Echo. Recent labs stable. RTC 2 months.   Graciella Freer, PA-C   12/08/2016 2:34 PM

## 2016-12-08 NOTE — Progress Notes (Signed)
Pre visit review using our clinic review tool, if applicable. No additional management support is needed unless otherwise documented below in the visit note. 

## 2016-12-09 ENCOUNTER — Ambulatory Visit (INDEPENDENT_AMBULATORY_CARE_PROVIDER_SITE_OTHER): Payer: Medicare Other | Admitting: Medical

## 2016-12-09 ENCOUNTER — Ambulatory Visit (INDEPENDENT_AMBULATORY_CARE_PROVIDER_SITE_OTHER): Payer: Medicare Other

## 2016-12-09 ENCOUNTER — Encounter: Payer: Self-pay | Admitting: Medical

## 2016-12-09 ENCOUNTER — Telehealth: Payer: Self-pay | Admitting: Cardiology

## 2016-12-09 VITALS — BP 101/51 | HR 62 | Temp 97.6°F | Resp 16 | Ht 69.0 in | Wt 131.6 lb

## 2016-12-09 DIAGNOSIS — I5022 Chronic systolic (congestive) heart failure: Secondary | ICD-10-CM | POA: Diagnosis not present

## 2016-12-09 DIAGNOSIS — R944 Abnormal results of kidney function studies: Secondary | ICD-10-CM

## 2016-12-09 DIAGNOSIS — Z95810 Presence of automatic (implantable) cardiac defibrillator: Secondary | ICD-10-CM

## 2016-12-09 DIAGNOSIS — Z0000 Encounter for general adult medical examination without abnormal findings: Secondary | ICD-10-CM | POA: Diagnosis not present

## 2016-12-09 DIAGNOSIS — D649 Anemia, unspecified: Secondary | ICD-10-CM

## 2016-12-09 LAB — CBC WITH DIFFERENTIAL/PLATELET
BASOS PCT: 0.4 % (ref 0.0–3.0)
Basophils Absolute: 0 10*3/uL (ref 0.0–0.1)
EOS ABS: 0 10*3/uL (ref 0.0–0.7)
EOS PCT: 0.5 % (ref 0.0–5.0)
HEMATOCRIT: 29.1 % — AB (ref 39.0–52.0)
HEMOGLOBIN: 9.6 g/dL — AB (ref 13.0–17.0)
Lymphocytes Relative: 9.5 % — ABNORMAL LOW (ref 12.0–46.0)
Lymphs Abs: 0.7 10*3/uL (ref 0.7–4.0)
MCHC: 32.8 g/dL (ref 30.0–36.0)
MCV: 87.4 fl (ref 78.0–100.0)
MONOS PCT: 9.2 % (ref 3.0–12.0)
Monocytes Absolute: 0.7 10*3/uL (ref 0.1–1.0)
NEUTROS ABS: 5.8 10*3/uL (ref 1.4–7.7)
Neutrophils Relative %: 80.4 % — ABNORMAL HIGH (ref 43.0–77.0)
PLATELETS: 260 10*3/uL (ref 150.0–400.0)
RBC: 3.33 Mil/uL — ABNORMAL LOW (ref 4.22–5.81)
RDW: 17.5 % — AB (ref 11.5–15.5)
WBC: 7.2 10*3/uL (ref 4.0–10.5)

## 2016-12-09 LAB — COMPREHENSIVE METABOLIC PANEL
ALT: 45 U/L (ref 0–53)
AST: 28 U/L (ref 0–37)
Albumin: 3.8 g/dL (ref 3.5–5.2)
Alkaline Phosphatase: 51 U/L (ref 39–117)
BUN: 27 mg/dL — ABNORMAL HIGH (ref 6–23)
CHLORIDE: 100 meq/L (ref 96–112)
CO2: 28 mEq/L (ref 19–32)
Calcium: 9.6 mg/dL (ref 8.4–10.5)
Creatinine, Ser: 2.35 mg/dL — ABNORMAL HIGH (ref 0.40–1.50)
GFR: 34.63 mL/min — ABNORMAL LOW (ref >60.00)
Glucose, Bld: 83 mg/dL (ref 70–99)
Potassium: 4.8 mEq/L (ref 3.5–5.1)
SODIUM: 134 meq/L — AB (ref 135–145)
Total Bilirubin: 0.9 mg/dL (ref 0.2–1.2)
Total Protein: 7.2 g/dL (ref 6.0–8.3)

## 2016-12-09 MED ORDER — SERTRALINE HCL 50 MG PO TABS: 50.0000 mg | ORAL_TABLET | Freq: Every day | ORAL | 1 refills | Status: DC

## 2016-12-09 NOTE — Telephone Encounter (Signed)
Confirmed remote transmission w/ pt caregiver.   

## 2016-12-09 NOTE — Patient Instructions (Addendum)
Bring a copy of your advance directives to your next office visit.  Please check your medication when you get home and see if you are taking rosuvastatin. You should be taking this daily. If you are out, please contact your pharmacy for a refill.  I reviewed the above the above. Pt feels mild depressed. Mood is mild decreased. He still reports mild decrease in appetite. He has been on low dose sertraline for 2 weeks. He still feels fatigued. Pt had anemia on last visit. He is now on iron. Also low gfr.(pt does not see a nephrologist).  Very brief discussion with pt today. Not charging separate visit. Increasing his sertraline to 50 mg q day. Will put in cbc, cmp to check his anemia and kidney function.  Follow up with 2 months with Dr. Patsy Lager or as needed with myself.  I reviewed RN noted and agree with assessment and plan.    Risk analyst Patient Education  [employer].

## 2016-12-09 NOTE — Progress Notes (Signed)
EPIC Encounter for ICM Monitoring  Patient Name: Andre Wilson is a 81 y.o. male Date: 12/09/2016 Primary Care Physican: Lamar Blinks, MD Primary Cardiologist:[place] Electrophysiologist: Curt Bears Dry Weight:unknown      Spoke with daughter, [name].  Heart Failure questions reviewed, pt feels extremely tired and weak.  He also has some shortness of breath.  She said it is difficult for him to walk around the house because of tiredness.  He had an appointment with HF clinic yesterday and blood work showed he is anemic.  She said there are a lot of factors that may be making him tired.  PCP started him on Zoloft to improve mood and appetite.    Thoracic impedance normal but did have a few days in the last week with fluid accumulation.  She said patient did not follow low salt diet in last week but they are back on track now.   PrescribedFurosemide to 40 mg daily alternating with 20 mg daily   Labs: 12/09/2016 Creatinine 2.35, BUN 27, Potassium 4.8, Sodium 134, EGFR 34.63 11/26/2016 Creatinine 2.19, BUN 28, Potassium 4.1, Sodium 138, EGFR 37.57 11/05/2016 Creatinine 2.03, BUN 31, Potassium 4.7, Sodium 138, EGFR 30-34 10/29/2016 Creatinine 2.34, BUN 42, Potassium 4.9, Sodium 137, EGFR 25-29  10/15/2016 Creatinine 2.07, BUN 32, Potassium 4.6, Sodium 138, EGFR 29-34  10/02/2016 Creatinine 1.89, BUN 29, Potassium 5.3, Sodium 137, EGFR 42  09/16/2016 Creatinine 2.01, BUN 27, Potassium 5.3, Sodium 138, EGFR 30-35 09/02/2016 Creatinine 2.02, BUN 27, Potassium 4.6, Sodium 138, EGFR 30-35 07/22/2016 Creatinine 1.92, BUN 24, Potassium 5.2, Sodium 139, EGFR 32-37 06/10/2016 Creatinine 1.54, BUN 22, Potassium 5.2, Sodium 137, EGFR 42-48  04/09/2016 Creatinine 1.40, BUN 21, Potassium 5.1, Sodium 139  02/10/2016 Creatinine 1.46, BUN 23, Potassium 5.0, Sodium 132, EGFR 45-52  01/27/2016 Creatinine 1.68, BUN 22, Potassium 5.2, Sodium 135, EGFR 38-44  01/22/2016 Creatinine 1.42, BUN 24,  Potassium 4.7, Sodium 135, EGFR 46-53  01/21/2016 Creatinine 1.59, BUN 30, Potassium 4.5, Sodium 136, EGFR 40-47  01/20/2016 Creatinine 1.35, BUN 30, Potassium 4.5, Sodium 135, EGFR 49-57  01/19/2016 Creatinine 1.54, BUN 29, Potassium 4.6, Sodium 135, EGFR 42-48  01/18/2016 Creatinine 1.99, BUN 30, Potassium 5.1, Sodium 137, EGFR 31-36 01/12/2016 Creatinine 1.54, BUN 21, Potassium 4.6, Sodium 139, EGFR 42-48  01/01/2016 Creatinine 1.49, BUN 28, Potassium 4.3, Sodium 135  12/26/2015 Creatinine 1.48, BUN 20, Potassium 4.5, Sodium 136, EGFR 44-51  Recommendations: No changes. Discussed to limit salt intake to 2000 mg/day and fluid intake to < 2 liters/day.  Encouraged to call for fluid symptoms.  Follow-up plan: ICM clinic phone appointment on 01/11/2017.  Office appointment scheduled on 02/09/2017 with HF Clinic.  Copy of ICM check sent to device physician.   3 month ICM trend: 12/09/2016   1 Year ICM trend:      Rosalene Billings, RN 12/09/2016 4:17 PM

## 2016-12-10 ENCOUNTER — Telehealth: Payer: Self-pay | Admitting: Medical

## 2016-12-10 DIAGNOSIS — R944 Abnormal results of kidney function studies: Secondary | ICD-10-CM

## 2016-12-10 DIAGNOSIS — D649 Anemia, unspecified: Secondary | ICD-10-CM

## 2016-12-10 NOTE — Telephone Encounter (Signed)
  12/08/2016 03:16 PM Phone (Outgoing) Wilson, Andre (Self) [phone] (H)   Left Message - Called pt and lmsg for him to Cb to get scheduled for a doctor's appt.     By Elita Boone

## 2016-12-10 NOTE — Telephone Encounter (Signed)
Placed future labs

## 2016-12-14 ENCOUNTER — Other Ambulatory Visit (INDEPENDENT_AMBULATORY_CARE_PROVIDER_SITE_OTHER): Payer: Medicare Other

## 2016-12-14 DIAGNOSIS — D649 Anemia, unspecified: Secondary | ICD-10-CM

## 2016-12-14 DIAGNOSIS — R944 Abnormal results of kidney function studies: Secondary | ICD-10-CM | POA: Diagnosis not present

## 2016-12-14 LAB — COMPREHENSIVE METABOLIC PANEL
ALT: 52 U/L (ref 0–53)
AST: 37 U/L (ref 0–37)
Albumin: 3.9 g/dL (ref 3.5–5.2)
Alkaline Phosphatase: 50 U/L (ref 39–117)
BILIRUBIN TOTAL: 0.9 mg/dL (ref 0.2–1.2)
BUN: 27 mg/dL — AB (ref 6–23)
CHLORIDE: 98 meq/L (ref 96–112)
CO2: 27 meq/L (ref 19–32)
CREATININE: 2.42 mg/dL — AB (ref 0.40–1.50)
Calcium: 9.5 mg/dL (ref 8.4–10.5)
GFR: 33.48 mL/min — ABNORMAL LOW (ref >60.00)
Glucose, Bld: 98 mg/dL (ref 70–99)
Potassium: 4.2 mEq/L (ref 3.5–5.1)
SODIUM: 133 meq/L — AB (ref 135–145)
Total Protein: 7.2 g/dL (ref 6.0–8.3)

## 2016-12-14 LAB — CBC WITH DIFFERENTIAL/PLATELET
BASOS PCT: 0.5 % (ref 0.0–3.0)
Basophils Absolute: 0 10*3/uL (ref 0.0–0.1)
EOS ABS: 0 10*3/uL (ref 0.0–0.7)
Eosinophils Relative: 0.3 % (ref 0.0–5.0)
HCT: 29.9 % — ABNORMAL LOW (ref 39.0–52.0)
Hemoglobin: 9.8 g/dL — ABNORMAL LOW (ref 13.0–17.0)
Lymphocytes Relative: 9.6 % — ABNORMAL LOW (ref 12.0–46.0)
Lymphs Abs: 0.7 10*3/uL (ref 0.7–4.0)
MCHC: 32.9 g/dL (ref 30.0–36.0)
MCV: 87.7 fl (ref 78.0–100.0)
MONO ABS: 0.7 10*3/uL (ref 0.1–1.0)
Monocytes Relative: 10 % (ref 3.0–12.0)
NEUTROS ABS: 5.5 10*3/uL (ref 1.4–7.7)
NEUTROS PCT: 79.6 % — AB (ref 43.0–77.0)
PLATELETS: 244 10*3/uL (ref 150.0–400.0)
RBC: 3.41 Mil/uL — ABNORMAL LOW (ref 4.22–5.81)
RDW: 17.9 % — AB (ref 11.5–15.5)
WBC: 6.9 10*3/uL (ref 4.0–10.5)

## 2016-12-14 LAB — FERRITIN: FERRITIN: 249.3 ng/mL (ref 22.0–322.0)

## 2016-12-14 LAB — IRON AND TIBC
%SAT: 16 % (ref 15–60)
IRON: 49 ug/dL — AB (ref 50–180)
TIBC: 302 ug/dL (ref 250–425)
UIBC: 253 ug/dL (ref 125–400)

## 2016-12-16 ENCOUNTER — Telehealth: Payer: Self-pay

## 2016-12-16 ENCOUNTER — Ambulatory Visit (INDEPENDENT_AMBULATORY_CARE_PROVIDER_SITE_OTHER): Payer: Self-pay

## 2016-12-16 DIAGNOSIS — I5022 Chronic systolic (congestive) heart failure: Secondary | ICD-10-CM

## 2016-12-16 DIAGNOSIS — Z95810 Presence of automatic (implantable) cardiac defibrillator: Secondary | ICD-10-CM

## 2016-12-16 NOTE — Progress Notes (Signed)
Call back to daughter and advised that when I receive any recommendations, I will call her tomorrow. She adjusted his Furosemide without recommendation today. She said today was the day to take Furosemide 20 mg and she gave him 40 mg.  Advised if his symptoms worsen tonight then use ER if needed.

## 2016-12-16 NOTE — Progress Notes (Signed)
EPIC Encounter for ICM Monitoring  Patient Name: Andre Wilson is a 81 y.o. male Date: 12/16/2016 Primary Care Physican: Lamar Blinks, MD Primary Cardiologist:McLean Electrophysiologist: Curt Bears Dry Weight:unknown                                                       Received call from daughter, [name].  She reported patient has swelling in both feet that is progressively worsening in the last 3- 4 days.  He has had shortness of  breath since yesterday and needed to sit up to breath better but even then remained [place] of breath.  No weight gain and weight is 131 lbs.  Received and reviewed remote transmission.   Thoracic impedance abnormal suggesting fluid accumulation starting 12/15/2016 which correlates with his symptoms.  PrescribedFurosemide to 40 mg daily alternating with 20 mg daily   Labs: 12/09/2016 Creatinine 2.35, BUN 27, Potassium 4.8, Sodium 134, EGFR 34.63 11/26/2016 Creatinine 2.19, BUN 28, Potassium 4.1, Sodium 138, EGFR 37.57 11/05/2016 Creatinine 2.03, BUN 31, Potassium 4.7, Sodium 138, EGFR 30-34 10/29/2016 Creatinine 2.34, BUN 42, Potassium 4.9, Sodium 137, EGFR 25-29  10/15/2016 Creatinine 2.07, BUN 32, Potassium 4.6, Sodium 138, EGFR 29-34  10/02/2016 Creatinine 1.89, BUN 29, Potassium 5.3, Sodium 137, EGFR 42  09/16/2016 Creatinine 2.01, BUN 27, Potassium 5.3, Sodium 138, EGFR 30-35 09/02/2016 Creatinine 2.02, BUN 27, Potassium 4.6, Sodium 138, EGFR 30-35 07/22/2016 Creatinine 1.92, BUN 24, Potassium 5.2, Sodium 139, EGFR 32-37 06/10/2016 Creatinine 1.54, BUN 22, Potassium 5.2, Sodium 137, EGFR 42-48  04/09/2016 Creatinine 1.40, BUN 21, Potassium 5.1, Sodium 139  02/10/2016 Creatinine 1.46, BUN 23, Potassium 5.0, Sodium 132, EGFR 45-52  01/27/2016 Creatinine 1.68, BUN 22, Potassium 5.2, Sodium 135, EGFR 38-44  01/22/2016 Creatinine 1.42, BUN 24, Potassium 4.7, Sodium 135, EGFR 46-53  01/21/2016 Creatinine 1.59, BUN 30, Potassium 4.5, Sodium  136, EGFR 40-47  01/20/2016 Creatinine 1.35, BUN 30, Potassium 4.5, Sodium 135, EGFR 49-57  01/19/2016 Creatinine 1.54, BUN 29, Potassium 4.6, Sodium 135, EGFR 42-48  01/18/2016 Creatinine 1.99, BUN 30, Potassium 5.1, Sodium 137, EGFR 31-36 01/12/2016 Creatinine 1.54, BUN 21, Potassium 4.6, Sodium 139, EGFR 42-48  01/01/2016 Creatinine 1.49, BUN 28, Potassium 4.3, Sodium 135  12/26/2015 Creatinine 1.48, BUN 20, Potassium 4.5, Sodium 136, EGFR 44-51  Recommendations: Copy of ICM check sent to Dr Aundra Dubin and Barrington Ellison, PA for review and any recommendations.     Follow-up plan: ICM clinic phone appointment on 12/20/2016 to recheck fluid levels.  Office appointment scheduled on 02/09/2017 with HF Clinic.  3 month ICM trend: 12/16/2016   1 Year ICM trend:      Rosalene Billings, RN 12/16/2016 11:03 AM

## 2016-12-16 NOTE — Telephone Encounter (Signed)
Received call from daughter, Roseanna Rainbow.  She reported patient has swelling in both feet that is progressively worsening in the last 3- 4 days.  He has had shortness of breath since yesterday and needed to sit up to breath better but even then remained short of breath.  No weight gain and weight is 131 lbs.  Requested to send remote transmission.  See ICM note

## 2016-12-17 MED ORDER — FUROSEMIDE 40 MG PO TABS: 40.0000 mg | ORAL_TABLET | Freq: Every day | ORAL | 3 refills | Status: DC

## 2016-12-17 NOTE — Progress Notes (Signed)
Call to daughter Roseanna Rainbow, [place].  She reported patient still sleeping and unsure how he is feeling today.  Advised Dr Shirlee Latch recommended patient to take 60 mg Lasix when next dose is due then increase to 40 mg daily after that without alternating. She verbalized understanding and BMET 1 week.  Scheduled BMET for 12/23/2016 since he has Echo scheduled at [facility] that day.  Advised to call back if patient is not feeling better next week.

## 2016-12-17 NOTE — Progress Notes (Signed)
Take 60 mg Lasix when next dose is due then increase to 40 mg daily after that without alternating. BMET 1 week.

## 2016-12-18 ENCOUNTER — Other Ambulatory Visit (HOSPITAL_COMMUNITY): Payer: Self-pay | Admitting: Student

## 2016-12-20 ENCOUNTER — Ambulatory Visit (INDEPENDENT_AMBULATORY_CARE_PROVIDER_SITE_OTHER): Payer: Self-pay

## 2016-12-20 DIAGNOSIS — I5022 Chronic systolic (congestive) heart failure: Secondary | ICD-10-CM

## 2016-12-20 DIAGNOSIS — Z95810 Presence of automatic (implantable) cardiac defibrillator: Secondary | ICD-10-CM

## 2016-12-20 NOTE — Progress Notes (Signed)
EPIC Encounter for ICM Monitoring  Patient Name: Andre Wilson is a 81 y.o. male Date: 12/20/2016 Primary Care Physican: Lamar Blinks, MD Primary Cardiologist:McLean Electrophysiologist: Curt Bears Nephrologist: 1st appt today Dry Weight:unknown                                                     Spoke with daughter, Andre Wilson. She reported urination did not increase in with change in Furosemide and he is urinating about 2 times a day.    She reported swelling continue to increase and has moved from just the feet to above the ankles.  He feels tired and has no appetite.  She said he is feeling really bad at this time.   Thoracic impedance returned to normal after taking extra Furosemide and changing of dosage to 40 mg daily but continues to have symptoms.    PrescribedFurosemide to 40 mg 1 tablet daily (changed at last ICM call 12/16/2016, was alternating 40 mg with 20 mg every other day).)  Labs: 12/09/2016 Creatinine 2.35, BUN 27, Potassium 4.8, Sodium 134, EGFR 34.63 11/26/2016 Creatinine 2.19, BUN 28, Potassium 4.1, Sodium 138, EGFR 37.57 11/05/2016 Creatinine 2.03, BUN 31, Potassium 4.7, Sodium 138, EGFR 30-34 10/29/2016 Creatinine 2.34, BUN 42, Potassium 4.9, Sodium 137, EGFR 25-29  10/15/2016 Creatinine 2.07, BUN 32, Potassium 4.6, Sodium 138, EGFR 29-34  10/02/2016 Creatinine 1.89, BUN 29, Potassium 5.3, Sodium 137, EGFR 42  09/16/2016 Creatinine 2.01, BUN 27, Potassium 5.3, Sodium 138, EGFR 30-35 09/02/2016 Creatinine 2.02, BUN 27, Potassium 4.6, Sodium 138, EGFR 30-35 07/22/2016 Creatinine 1.92, BUN 24, Potassium 5.2, Sodium 139, EGFR 32-37 06/10/2016 Creatinine 1.54, BUN 22, Potassium 5.2, Sodium 137, EGFR 42-48  04/09/2016 Creatinine 1.40, BUN 21, Potassium 5.1, Sodium 139  02/10/2016 Creatinine 1.46, BUN 23, Potassium 5.0, Sodium 132, EGFR 45-52  01/27/2016 Creatinine 1.68, BUN 22, Potassium 5.2, Sodium 135, EGFR 38-44    01/22/2016 Creatinine 1.42, BUN 24, Potassium 4.7, Sodium 135, EGFR 46-53  01/21/2016 Creatinine 1.59, BUN 30, Potassium 4.5, Sodium 136, EGFR 40-47  01/20/2016 Creatinine 1.35, BUN 30, Potassium 4.5, Sodium 135, EGFR 49-57  01/19/2016 Creatinine 1.54, BUN 29, Potassium 4.6, Sodium 135, EGFR 42-48  01/18/2016 Creatinine 1.99, BUN 30, Potassium 5.1, Sodium 137, EGFR 31-36 01/12/2016 Creatinine 1.54, BUN 21, Potassium 4.6, Sodium 139, EGFR 42-48  01/01/2016 Creatinine 1.49, BUN 28, Potassium 4.3, Sodium 135  12/26/2015 Creatinine 1.48, BUN 20, Potassium 4.5, Sodium 136, EGFR 44-51   Recommendations: Patient has 1st visit with nephrologist today.  Advised would send copy of ICM report to Dr Aundra Dubin for review and recommendations and to Dr Curt Bears.     Follow-up plan: ICM clinic phone appointment on 12/28/2016.  Echo scheduled 5/3 and 76fice appointment scheduled on 02/09/2017 with HF clinic.  3 month ICM trend: 12/20/2016   1 Year ICM trend:      LRosalene Billings RN 12/20/2016 1:29 PM

## 2016-12-21 NOTE — Progress Notes (Signed)
Spoke with daughter, Roseanna Rainbow, she reported the 1st visit with Nephrologist, Dr Lowell Guitar went well yesterday.  She said patient is anemic and will either need iron or blood transmission. Nephrologist is running more tests and will follow up with patient/daughter. Swelling in legs were evaluated by nephrologist She said patient still feels really bad.    Advised Dr Shirlee Latch has requested to be seen in the office.  Advised I would have HF clinic call her to make an appointment as soon as there is an opening.  Advised if she does not receive a call by tomorrow afternoon and request an appointment.    No changes today. Next ICM remote transmission 12/28/2016

## 2016-12-21 NOTE — Progress Notes (Signed)
He needs followup in office to assess.

## 2016-12-22 ENCOUNTER — Ambulatory Visit (INDEPENDENT_AMBULATORY_CARE_PROVIDER_SITE_OTHER): Payer: Medicare Other | Admitting: Family Medicine

## 2016-12-22 ENCOUNTER — Encounter: Payer: Self-pay | Admitting: Family Medicine

## 2016-12-22 VITALS — BP 100/58 | HR 59 | Temp 97.6°F | Ht 69.0 in | Wt 128.2 lb

## 2016-12-22 DIAGNOSIS — R944 Abnormal results of kidney function studies: Secondary | ICD-10-CM

## 2016-12-22 DIAGNOSIS — R5383 Other fatigue: Secondary | ICD-10-CM | POA: Diagnosis not present

## 2016-12-22 DIAGNOSIS — I509 Heart failure, unspecified: Secondary | ICD-10-CM | POA: Diagnosis not present

## 2016-12-22 DIAGNOSIS — F329 Major depressive disorder, single episode, unspecified: Secondary | ICD-10-CM

## 2016-12-22 DIAGNOSIS — R634 Abnormal weight loss: Secondary | ICD-10-CM | POA: Diagnosis not present

## 2016-12-22 MED ORDER — PAROXETINE HCL 10 MG PO TABS
10.0000 mg | ORAL_TABLET | Freq: Every day | ORAL | 6 refills | Status: DC
Start: 2016-12-22 — End: 2017-04-11

## 2016-12-22 NOTE — Patient Instructions (Addendum)
I am sorry that you are having such a hard time. I suspect that your heart failure is the root cause- will look for the results of your echo tomorrow.  As far as appetite, let's try changing from zoloft (sertraline) to paxil (paroxetine)- this medication is known to trigger weight gain.  You can switch directly from one medication to the other  Please let me know if I can do anything to help, and let's plan to visit in 2-3 months Do work on getting more concentrated calories- ice cream daily is ok at this time!

## 2016-12-22 NOTE — Progress Notes (Signed)
[facility] [address], [address] [place], [place] [address] [phone]  Date:  12/22/2016   Name:  Andre Wilson   DOB:  October 11, 1935   MRN:  [id number]  PCP:  Abbe Amsterdam, MD    Chief Complaint: Follow-up (Pt here to follow up on anemia. )   History of Present Illness:  Andre Wilson is a 81 y.o. very pleasant male patient who presents with the following:  Last seen by myself about one year ago with the following partial HPI:  Presented to the ED on 12/13/15 with SOB and swelling.  He was traveling from [place] to see his grandson play football at [employer]. He was admitted through 4/29 with acute systolic and diastolic HF, CAF, cardiomyopathy and pneumonia.  While admitted he became unresponsive with V tach and was resuscitated, now has an ICD.   Prior history of MI and CABG in 1987. After that he was doing well, living independently until he had a GI bleed March 2017- this led to another heart attack.  He was in a rehab center until 4/11, then traveled to see his daughter. unfortunately he got ill again with CHF as above  He had a cath during last admission that did not show any need for PCI  He is seeing Dr. Shirlee Latch for HF- visited there 6 days ago.  He is getting his strength back.  He always had high BP in the past, but now his BP is running low He is still taking lasix 40 BID, and just a touch of coreg.  He is also on amiodarone No falls, no passing out.  He does feel weak and a bit lightheaded a lot of the time still. Since March he has lost about 40 lbs and has lost a lot of his fitness and strnegth.   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   He has one daugther, and 3 grandchildren.   He is following a low sodium diet and doing daily weights  Since I saw him last, he has started seeing Dr. Lowell Guitar- first visit just yesterday for his renal insuf and anemia He has been noted to have  anemia as of late but ferritin was ok  He has noted fatigue for the last month or so, and he is also losing weight. He is not eating enough according to his daughter who is here with him today He is feeling less hungry.  Food does not seem to appeal to him.  Also he gets so tired that he has a hard time eating enough to maintain his weight No vomiting or diarrhea He gets very tired and out of breath with even just minor tasks   He is taking tramadol for his back, and sertraline 25 per Ramon Dredge for depression.  They had planned to increase this to 50 mg at last visit, but he has not started the higher dose as of yet.   Admits that he does feel down due to his health issues Prior to last year he was generally in good health    Lab Results  Component Value Date   WBC 6.9 12/14/2016   HGB 9.8 (L) 12/14/2016   HCT 29.9 (L) 12/14/2016   MCV 87.7 12/14/2016   PLT 244.0 12/14/2016   He is on lasix 40 daily at this time He continues to have foot and ankle swelling but this is relatively mild and is not causing  any discomfort at this time  Wt Readings from Last 3 Encounters:  12/22/16 128 lb 3.2 oz (58.2 kg)  12/09/16 131 lb 9.6 oz (59.7 kg)  12/08/16 132 lb 2 oz (59.9 kg)     Patient Active Problem List   Diagnosis Date Noted  . Peripheral arterial disease (HCC) 07/22/2016  . Fatigue 01/27/2016  . ICD (implantable cardioverter-defibrillator) in place 01/27/2016  . ST elevation (STEMI) myocardial infarction involving left anterior descending coronary artery (HCC) 01/19/2016  . Chronic systolic CHF (congestive heart failure) (HCC) 01/13/2016  . Chest pain   . Moderate to severe aortic stenosis 12/16/2015  . Mitral regurgitation and aortic stenosis 12/16/2015  . Cardiomyopathy, ischemic 12/16/2015  . Essential hypertension   . Sustained VT (ventricular tachycardia) (HCC) 12/15/2015  . Cardiac arrest [facility]) secondary to sustained V tach requiring CPR 12/15/15 12/15/2015  . Coronary artery  disease involving autologous vein bypass graft   . Coronary artery disease involving native heart with angina pectoris (HCC) 12/13/2015  . Protein-calorie malnutrition, moderate (HCC) 12/13/2015  . Elevated troponin 12/13/2015  . GI bleed   . Hypertension   . Hypercholesteremia     Past Medical History:  Diagnosis Date  . Arthritis   . Blood transfusion without reported diagnosis   . CAD (coronary artery disease)   . Cardiac arrest [facility]) secondary to sustained V tach requiring CPR 12/15/15 12/15/2015   s/p [employer] Ellipse VR (serial Number [id number]) ICD  . Cataract   . CHF (congestive heart failure) (HCC)   . GI bleed   . Glaucoma   . Heart murmur   . Hypercholesteremia   . Hypertension   . MI (myocardial infarction) (HCC)   . Oxygen deficiency   . Sustained VT (ventricular tachycardia) (HCC) 12/15/2015    Past Surgical History:  Procedure Laterality Date  . CARDIAC CATHETERIZATION     CABG 1987  . CARDIAC CATHETERIZATION N/A 12/17/2015   Procedure: Right/Left Heart Cath and Coronary/Graft Angiography;  Surgeon: Lyn Records, MD;  Location: [facility] INVASIVE CV LAB;  Service: Cardiovascular;  Laterality: N/A;  . CARDIAC CATHETERIZATION N/A 01/18/2016   Procedure: Left Heart Cath and Coronary Angiography;  Surgeon: Runell Gess, MD;  Location: [facility] INVASIVE CV LAB;  Service: Cardiovascular;  Laterality: N/A;  . CARDIAC CATHETERIZATION N/A 01/20/2016   Procedure: Coronary Stent Intervention;  Surgeon: Peter M Swaziland, MD;  Location: [facility] INVASIVE CV LAB;  Service: Cardiovascular;  Laterality: N/A;  . CARDIAC CATHETERIZATION N/A 02/12/2016   Procedure: Right Heart Cath;  Surgeon: Laurey Morale, MD;  Location: [facility] INVASIVE CV LAB;  Service: Cardiovascular;  Laterality: N/A;  . EP IMPLANTABLE DEVICE N/A 12/18/2015   Procedure: ICD Implant;  Surgeon: Will Jorja Loa, MD; [facility] VR (serial Number [id number]) ICD   . PERIPHERAL VASCULAR CATHETERIZATION N/A 04/14/2016   Procedure:  Abdominal Aortogram w/Lower Extremity;  Surgeon: Iran Ouch, MD;  Location: [facility] INVASIVE CV LAB;  Service: Cardiovascular;  Laterality: N/A;  . ULTRASOUND GUIDANCE FOR VASCULAR ACCESS  01/20/2016   Procedure: Ultrasound Guidance For Vascular Access;  Surgeon: Peter M Swaziland, MD;  Location: [facility] INVASIVE CV LAB;  Service: Cardiovascular;;    Social History  Substance Use Topics  . Smoking status: Former Games developer  . Smokeless tobacco: Never Used  . Alcohol use No    Family History  Problem Relation Age of Onset  . Hypertension Mother   . Hyperlipidemia Mother   . Heart attack Father   . Aneurysm Brother   .  Heart disease Brother   . Hypertension Brother   . Heart disease Brother   . Hypertension Brother     No Known Allergies  Medication list has been reviewed and updated.  Current Outpatient Prescriptions on File Prior to Visit  Medication Sig Dispense Refill  . amiodarone (PACERONE) 200 MG tablet Take 1 tablet (200 mg total) by mouth daily. 30 tablet 3  . aspirin EC 81 MG EC tablet Take 1 tablet (81 mg total) by mouth daily. 30 tablet 3  . clopidogrel (PLAVIX) 75 MG tablet take 1 tablet by mouth once daily 90 tablet 3  . digoxin (LANOXIN) 0.125 MG tablet Take 1 tab every other day ALTERNATING with 0.5 tab every other day    . ezetimibe (ZETIA) 10 MG tablet take 1 tablet by mouth once daily 90 tablet 1  . ferrous sulfate 325 (65 FE) MG EC tablet Take 1 tablet (325 mg total) by mouth 3 (three) times daily with meals. (Patient taking differently: Take 325 mg by mouth 2 (two) times daily with a meal. ) 90 tablet 3  . folic acid (FOLVITE) 1 MG tablet take 1 tablet by mouth once daily 30 tablet 3  . furosemide (LASIX) 40 MG tablet Take 1 tablet (40 mg total) by mouth daily. 90 tablet 3  . Investigational - Study Medication Take 1 tablet by mouth 2 (two) times daily. Enrolled in [employer] Clinical Trial - Omecamtiv Mecarbil or Placebo    . isosorbide-hydrALAZINE (BIDIL) 20-37.5 MG  tablet Take 0.5 tablets by mouth 3 (three) times daily. 45 tablet 3  . latanoprost (XALATAN) 0.005 % ophthalmic solution Place 1 drop into both eyes at bedtime.  0  . linaclotide (LINZESS) 145 MCG CAPS capsule Take 1 capsule (145 mcg total) by mouth daily as needed (for constipation). Reported on 01/01/2016 30 capsule 3  . nitroGLYCERIN (NITROSTAT) 0.4 MG SL tablet Place 0.4 mg under the tongue every 5 (five) minutes as needed for chest pain.    . promethazine (PHENERGAN) 12.5 MG tablet Take 1 tablet (12.5 mg total) by mouth every 6 (six) hours as needed for nausea or vomiting. 30 tablet 0  . RANEXA 500 MG 12 hr tablet take 1 tablet by mouth twice a day 60 tablet 5  . rosuvastatin (CRESTOR) 40 MG tablet Take 1 tablet (40 mg total) by mouth daily. 90 tablet 3  . sertraline (ZOLOFT) 50 MG tablet Take 1 tablet (50 mg total) by mouth daily. 30 tablet 1  . spironolactone (ALDACTONE) 25 MG tablet take 1/2 tablet by mouth every morning 15 tablet 6  . timolol (TIMOPTIC) 0.5 % ophthalmic solution Place 1 drop into both eyes 2 (two) times daily.  0  . traMADol (ULTRAM) 50 MG tablet 1/2 tab po q day as needed for pain 5 tablet 0  . traZODone (DESYREL) 50 MG tablet Take 1/2 or 1 at bedtime as needed for sleep 90 tablet 3  . vitamin C (ASCORBIC ACID) 500 MG tablet Take 1 tablet (500 mg total) by mouth 2 (two) times daily. 60 tablet 3   No current facility-administered medications on file prior to visit.     Review of Systems:  As per HPI- otherwise negative.   Physical Examination: Vitals:   12/22/16 0923  BP: (!) 100/58  Pulse: (!) 59  Temp: 97.6 F (36.4 C)   Vitals:   12/22/16 0923  Weight: 128 lb 3.2 oz (58.2 kg)  Height:  (1.753 m)   Body mass index is 18.93  kg/m. Ideal Body Weight: Weight in (lb) to have BMI = 25: 168.9  GEN: WDWN, NAD, Non-toxic, A & O x 3, very thin. Sitting in [facility] today,  Here with his daughter HEENT: Atraumatic, Normocephalic. Neck supple. No masses, No  LAD. Ears and Nose: No external deformity. CV: RRR, No M/G/R. No JVD. No thrill. No extra heart sounds. PULM: CTA B, no wheezes, crackles, rhonchi. No retractions. No resp. distress. No accessory muscle use. EXTR: No c/c. 1+ edema of both ankles  NEURO Normal gait.  PSYCH: Normally interactive. Conversant. Not depressed or anxious appearing.  Calm demeanor.   Lab Results  Component Value Date   HGBA1C 5.8 (H) 12/14/2015     BP Readings from Last 3 Encounters:  12/22/16 (!) 100/58  12/09/16 (!) 101/51  12/08/16 (!) 94/44    Assessment and Plan: Depression, reactive - Plan: PARoxetine (PAXIL) 10 MG tablet  Weight loss - Plan: PARoxetine (PAXIL) 10 MG tablet  Decreased GFR  Chronic congestive heart failure, unspecified heart failure type (HCC)  Other fatigue  Here today for a periodic recheck Cliffton's health unfortunately took a turn for the worse last year- he now has CHF with an ICD in place.  He notes fatigue and weight loss. He has an echo tomorrow; counseled him that his sx may be due to worsening cardiac function. If NOT the case, we will plan other evaluation He has suffered with low mood and also poor appetite; will change him from zoloft to paxil in hopes of stimulating his appetite.  As he gets tired easily I encouraged more concentrated calories for him Plan to visit here in 2-3 months to check on how he is doing   Signed Abbe Amsterdam, MD

## 2016-12-23 ENCOUNTER — Ambulatory Visit (HOSPITAL_BASED_OUTPATIENT_CLINIC_OR_DEPARTMENT_OTHER): Payer: Medicare Other

## 2016-12-23 ENCOUNTER — Other Ambulatory Visit: Payer: Self-pay

## 2016-12-23 ENCOUNTER — Ambulatory Visit (HOSPITAL_COMMUNITY)
Admission: RE | Admit: 2016-12-23 | Discharge: 2016-12-23 | Disposition: A | Discharge status: Home / Self Care | Payer: Medicare Other | Source: Ambulatory Visit | Attending: Cardiology | Admitting: Cardiology

## 2016-12-23 DIAGNOSIS — I361 Nonrheumatic tricuspid (valve) insufficiency: Secondary | ICD-10-CM | POA: Insufficient documentation

## 2016-12-23 DIAGNOSIS — I340 Nonrheumatic mitral (valve) insufficiency: Secondary | ICD-10-CM | POA: Diagnosis not present

## 2016-12-23 DIAGNOSIS — Q231 Congenital insufficiency of aortic valve: Secondary | ICD-10-CM | POA: Insufficient documentation

## 2016-12-23 DIAGNOSIS — I272 Pulmonary hypertension, unspecified: Secondary | ICD-10-CM | POA: Diagnosis not present

## 2016-12-23 DIAGNOSIS — I5022 Chronic systolic (congestive) heart failure: Secondary | ICD-10-CM

## 2016-12-23 DIAGNOSIS — Z006 Encounter for examination for normal comparison and control in clinical research program: Secondary | ICD-10-CM

## 2016-12-23 DIAGNOSIS — I420 Dilated cardiomyopathy: Secondary | ICD-10-CM | POA: Insufficient documentation

## 2016-12-23 DIAGNOSIS — I509 Heart failure, unspecified: Secondary | ICD-10-CM

## 2016-12-23 LAB — BASIC METABOLIC PANEL
ANION GAP: 9 (ref 5–15)
BUN: 23 mg/dL — ABNORMAL HIGH (ref 6–20)
CALCIUM: 9 mg/dL (ref 8.9–10.3)
CO2: 27 mmol/L (ref 22–32)
Chloride: 99 mmol/L — ABNORMAL LOW (ref 101–111)
Creatinine, Ser: 2.56 mg/dL — ABNORMAL HIGH (ref 0.61–1.24)
GFR calc Af Amer: 26 mL/min — ABNORMAL LOW (ref >60)
GFR calc non Af Amer: 22 mL/min — ABNORMAL LOW (ref >60)
GLUCOSE: 94 mg/dL (ref 65–99)
POTASSIUM: 4 mmol/L (ref 3.5–5.1)
Sodium: 135 mmol/L (ref 135–145)

## 2016-12-23 NOTE — Progress Notes (Signed)
Late entry-date 11-11-16 1300 [facility] He states he is doing well, other than he continues to feel fatigued. He states he is attempting to do home exercises, but doesn't think he making much progress. Reassured. Feels less short of breath since increase in furosemide. Denied ACS symptoms. Remain NYHA Class III.  Reminded to return study drug boxes.  No questions or concerned related to the study. Will follow up in 2 weeks for Week 8 study visit.

## 2016-12-23 NOTE — Progress Notes (Signed)
Patient present for [facility] Study Week 12 visit. He states he feels ok today, he continues to feel fatigued with poor appetite. He did see his PCP and was started on antidepressant with hopes that it will increase his appetite and mood. His PCP also prescribed Tramadol for his back pain. States he has had back pain for years but is worse as he becomes more sedentary. He states he is attempting to do home exercises, but doesn't think he making much progress. Reassured. Feels less short of breath since increase in furosemide. States he just had blood drawn to follow kidney status with use of diuretic. Denied ACS symptoms. Remain NYHA Class III. Verbalizes IP compliance, reminded to return IP boxes. He states he was running late and forgot them. He states he will have his daughter drop them off in the next week or so. No questions or concerned related to the study. Will follow up in 12 weeks for Week 24 study visit.

## 2016-12-24 ENCOUNTER — Other Ambulatory Visit: Payer: Self-pay | Admitting: Nephrology

## 2016-12-24 DIAGNOSIS — N179 Acute kidney failure, unspecified: Secondary | ICD-10-CM

## 2016-12-24 DIAGNOSIS — N183 Chronic kidney disease, stage 3 unspecified: Secondary | ICD-10-CM

## 2016-12-26 ENCOUNTER — Encounter: Payer: Self-pay | Admitting: Family Medicine

## 2016-12-26 DIAGNOSIS — N183 Chronic kidney disease, stage 3 unspecified: Secondary | ICD-10-CM | POA: Insufficient documentation

## 2016-12-28 ENCOUNTER — Telehealth: Payer: Self-pay

## 2016-12-28 ENCOUNTER — Ambulatory Visit (INDEPENDENT_AMBULATORY_CARE_PROVIDER_SITE_OTHER): Payer: Self-pay

## 2016-12-28 DIAGNOSIS — Z95810 Presence of automatic (implantable) cardiac defibrillator: Secondary | ICD-10-CM

## 2016-12-28 DIAGNOSIS — I5022 Chronic systolic (congestive) heart failure: Secondary | ICD-10-CM

## 2016-12-28 NOTE — Telephone Encounter (Signed)
Spoke to patients daughter and ask if she could bring in empty study drug boxes, she states she will bring them by on 12-30-16. She states patient is doing well today.

## 2016-12-28 NOTE — Progress Notes (Signed)
Take Lasix 60 mg daily x 2 days then back to 40 mg daily.  Needs BMET in 1 week and followup CHF clinic.

## 2016-12-28 NOTE — Progress Notes (Signed)
EPIC Encounter for ICM Monitoring  Patient Name: Andre Wilson is a 81 y.o. male Date: 12/28/2016 Primary Care Physican: Darreld Mclean, MD Primary Cardiologist:McLean Electrophysiologist: Curt Bears Nephrologist: Lyn Henri Weight:unknown       Spoke with daughter, Andre Wilson.  Heart Failure questions reviewed, pt continues to have no appetite and not eating very much, feet remain swollen but has slight improvement, now below ankles.    Thoracic impedance abnormal suggesting fluid accumulation since 12/24/2016.  PrescribedFurosemide to 40 mg 1 tablet daily (changed at last ICM call 12/16/2016, was alternating 40 mg with 20 mg every other day).  Labs: 12/09/2016 Creatinine 2.35, BUN 27, Potassium 4.8, Sodium 134, EGFR 34.63 11/26/2016 Creatinine 2.19, BUN 28, Potassium 4.1, Sodium 138, EGFR 37.57 11/05/2016 Creatinine 2.03, BUN 31, Potassium 4.7, Sodium 138, EGFR 30-34 10/29/2016 Creatinine 2.34, BUN 42, Potassium 4.9, Sodium 137, EGFR 25-29  10/15/2016 Creatinine 2.07, BUN 32, Potassium 4.6, Sodium 138, EGFR 29-34  10/02/2016 Creatinine 1.89, BUN 29, Potassium 5.3, Sodium 137, EGFR 42  09/16/2016 Creatinine 2.01, BUN 27, Potassium 5.3, Sodium 138, EGFR 30-35 09/02/2016 Creatinine 2.02, BUN 27, Potassium 4.6, Sodium 138, EGFR 30-35 07/22/2016 Creatinine 1.92, BUN 24, Potassium 5.2, Sodium 139, EGFR 32-37 06/10/2016 Creatinine 1.54, BUN 22, Potassium 5.2, Sodium 137, EGFR 42-48  04/09/2016 Creatinine 1.40, BUN 21, Potassium 5.1, Sodium 139  02/10/2016 Creatinine 1.46, BUN 23, Potassium 5.0, Sodium 132, EGFR 45-52  01/27/2016 Creatinine 1.68, BUN 22, Potassium 5.2, Sodium 135, EGFR 38-44  01/22/2016 Creatinine 1.42, BUN 24, Potassium 4.7, Sodium 135, EGFR 46-53  01/21/2016 Creatinine 1.59, BUN 30, Potassium 4.5, Sodium 136, EGFR 40-47  01/20/2016 Creatinine 1.35, BUN 30, Potassium 4.5, Sodium 135, EGFR 49-57  01/19/2016 Creatinine 1.54, BUN 29, Potassium 4.6, Sodium 135, EGFR  42-48  01/18/2016 Creatinine 1.99, BUN 30, Potassium 5.1, Sodium 137, EGFR 31-36 01/12/2016 Creatinine 1.54, BUN 21, Potassium 4.6, Sodium 139, EGFR 42-48  01/01/2016 Creatinine 1.49, BUN 28, Potassium 4.3, Sodium 135  12/26/2015 Creatinine 1.48, BUN 20, Potassium 4.5, Sodium 136, EGFR 44-51  Recommendations:  Advised Dr Aundra Dubin had recommended that patient been seen in the office to be assessed and she should be receiving a call from HF clinic for appointment.  Message sent to HF clinic to call daughter to make an appointment.   Copy of ICM check sent to Dr Aundra Dubin and Dr Curt Bears for review and if any recommendations will call her back. Advised for any worsening symptoms to use ER if needed.     Follow-up plan: ICM clinic phone appointment on 01/06/2017 to recheck fluids.  Office appointment scheduled on 02/09/2017 with HF clinic    3 month ICM trend: 12/28/2016   1 Year ICM trend:      Rosalene Billings, RN 12/28/2016 7:40 AM

## 2016-12-29 ENCOUNTER — Other Ambulatory Visit (HOSPITAL_COMMUNITY): Payer: Self-pay | Admitting: *Deleted

## 2016-12-30 ENCOUNTER — Telehealth (HOSPITAL_COMMUNITY): Payer: Self-pay | Admitting: *Deleted

## 2016-12-30 ENCOUNTER — Ambulatory Visit (HOSPITAL_COMMUNITY)
Admission: RE | Admit: 2016-12-30 | Discharge: 2016-12-30 | Disposition: A | Discharge status: Home / Self Care | Payer: Medicare Other | Source: Ambulatory Visit | Attending: Nephrology | Admitting: Nephrology

## 2016-12-30 DIAGNOSIS — N183 Chronic kidney disease, stage 3 unspecified: Secondary | ICD-10-CM

## 2016-12-30 DIAGNOSIS — D631 Anemia in chronic kidney disease: Secondary | ICD-10-CM | POA: Diagnosis present

## 2016-12-30 LAB — POCT HEMOGLOBIN-HEMACUE: Hemoglobin: 9.5 g/dL — ABNORMAL LOW (ref 13.0–17.0)

## 2016-12-30 MED ORDER — EPOETIN ALFA 20000 UNIT/ML IJ SOLN
20000.0000 [IU] | INTRAMUSCULAR | Status: DC
  Administered 2016-12-30: 20000 [IU] via SUBCUTANEOUS

## 2016-12-30 MED ORDER — SODIUM CHLORIDE 0.9 % IV SOLN
510.0000 mg | INTRAVENOUS | Status: DC
  Administered 2016-12-30: 12:00:00 510 mg via INTRAVENOUS
  Filled 2016-12-30: qty 17

## 2016-12-30 MED ORDER — EPOETIN ALFA 20000 UNIT/ML IJ SOLN
INTRAMUSCULAR | Status: AC
  Administered 2016-12-30: 12:00:00 20000 [IU] via SUBCUTANEOUS
  Filled 2016-12-30: qty 1

## 2016-12-30 NOTE — Discharge Instructions (Signed)
Epoetin Alfa injection °What is this medicine? °EPOETIN ALFA (e POE e tin AL fa) helps your body make more red blood cells. This medicine is used to treat anemia caused by chronic kidney failure, cancer chemotherapy, or HIV-therapy. It may also be used before surgery if you have anemia. °This medicine may be used for other purposes; ask your health care provider or pharmacist if you have questions. °COMMON BRAND NAME(S): Epogen, Procrit °What should I tell my health care provider before I take this medicine? °They need to know if you have any of these conditions: °-blood clotting disorders °-cancer patient not on chemotherapy °-cystic fibrosis °-heart disease, such as angina or heart failure °-hemoglobin level of 12 g/dL or greater °-high blood pressure °-low levels of folate, iron, or vitamin B12 °-seizures °-an unusual or allergic reaction to erythropoietin, albumin, benzyl alcohol, hamster proteins, other medicines, foods, dyes, or preservatives °-pregnant or trying to get pregnant °-breast-feeding °How should I use this medicine? °This medicine is for injection into a vein or under the skin. It is usually given by a health care professional in a hospital or clinic setting. °If you get this medicine at home, you will be taught how to prepare and give this medicine. Use exactly as directed. Take your medicine at regular intervals. Do not take your medicine more often than directed. °It is important that you put your used needles and syringes in a special sharps container. Do not put them in a trash can. If you do not have a sharps container, call your pharmacist or healthcare provider to get one. °A special MedGuide will be given to you by the pharmacist with each prescription and refill. Be sure to read this information carefully each time. °Talk to your pediatrician regarding the use of this medicine in children. While this drug may be prescribed for selected conditions, precautions do apply. °Overdosage: If you  think you have taken too much of this medicine contact a poison control center or emergency room at once. °NOTE: This medicine is only for you. Do not share this medicine with others. °What if I miss a dose? °If you miss a dose, take it as soon as you can. If it is almost time for your next dose, take only that dose. Do not take double or extra doses. °What may interact with this medicine? °Do not take this medicine with any of the following medications: °-darbepoetin alfa °This list may not describe all possible interactions. Give your health care provider a list of all the medicines, herbs, non-prescription drugs, or dietary supplements you use. Also tell them if you smoke, drink alcohol, or use illegal drugs. Some items may interact with your medicine. °What should I watch for while using this medicine? °Your condition will be monitored carefully while you are receiving this medicine. °You may need blood work done while you are taking this medicine. °What side effects may I notice from receiving this medicine? °Side effects that you should report to your doctor or health care professional as soon as possible: °-allergic reactions like skin rash, itching or hives, swelling of the face, lips, or tongue °-breathing problems °-changes in vision °-chest pain °-confusion, trouble speaking or understanding °-feeling faint or lightheaded, falls °-high blood pressure °-muscle aches or pains °-pain, swelling, warmth in the leg °-rapid weight gain °-severe headaches °-sudden numbness or weakness of the face, arm or leg °-trouble walking, dizziness, loss of balance or coordination °-seizures (convulsions) °-swelling of the ankles, feet, hands °-unusually weak or tired °  Side effects that usually do not require medical attention (report to your doctor or health care professional if they continue or are bothersome): °-diarrhea °-fever, chills (flu-like symptoms) °-headaches °-nausea, vomiting °-redness, stinging, or swelling at  site where injected °This list may not describe all possible side effects. Call your doctor for medical advice about side effects. You may report side effects to FDA at 1-800-FDA-1088. °Where should I keep my medicine? °Keep out of the reach of children. °Store in a refrigerator between 2 and 8 degrees C (36 and 46 degrees F). Do not freeze or shake. Throw away any unused portion if using a single-dose vial. Multi-dose vials can be kept in the refrigerator for up to 21 days after the initial dose. Throw away unused medicine. °NOTE: This sheet is a summary. It may not cover all possible information. If you have questions about this medicine, talk to your doctor, pharmacist, or health care provider. °© 2018 [employer] (2016-03-29 19:42:31) ° °Ferumoxytol injection °What is this medicine? °FERUMOXYTOL is an iron complex. Iron is used to make healthy red blood cells, which carry oxygen and nutrients throughout the body. This medicine is used to treat iron deficiency anemia in people with chronic kidney disease. °This medicine may be used for other purposes; ask your health care provider or pharmacist if you have questions. °COMMON BRAND NAME(S): Feraheme °What should I tell my health care provider before I take this medicine? °They need to know if you have any of these conditions: °-anemia not caused by low iron levels °-high levels of iron in the blood °-magnetic resonance imaging (MRI) test scheduled °-an unusual or allergic reaction to iron, other medicines, foods, dyes, or preservatives °-pregnant or trying to get pregnant °-breast-feeding °How should I use this medicine? °This medicine is for injection into a vein. It is given by a health care professional in a hospital or clinic setting. °Talk to your pediatrician regarding the use of this medicine in children. Special care may be needed. °Overdosage: If you think you have taken too much of this medicine contact a poison control center or emergency  room at once. °NOTE: This medicine is only for you. Do not share this medicine with others. °What if I miss a dose? °It is important not to miss your dose. Call your doctor or health care professional if you are unable to keep an appointment. °What may interact with this medicine? °This medicine may interact with the following medications: °-other iron products °This list may not describe all possible interactions. Give your health care provider a list of all the medicines, herbs, non-prescription drugs, or dietary supplements you use. Also tell them if you smoke, drink alcohol, or use illegal drugs. Some items may interact with your medicine. °What should I watch for while using this medicine? °Visit your doctor or healthcare professional regularly. Tell your doctor or healthcare professional if your symptoms do not start to get better or if they get worse. You may need blood work done while you are taking this medicine. °You may need to follow a special diet. Talk to your doctor. Foods that contain iron include: whole grains/cereals, dried fruits, beans, or peas, leafy green vegetables, and organ meats (liver, kidney). °What side effects may I notice from receiving this medicine? °Side effects that you should report to your doctor or health care professional as soon as possible: °-allergic reactions like skin rash, itching or hives, swelling of the face, lips, or tongue °-breathing problems °-changes in blood pressure °-feeling   faint or lightheaded, falls °-fever or chills °-flushing, sweating, or hot feelings °-swelling of the ankles or feet °Side effects that usually do not require medical attention (report to your doctor or health care professional if they continue or are bothersome): °-diarrhea °-headache °-nausea, vomiting °-stomach pain °This list may not describe all possible side effects. Call your doctor for medical advice about side effects. You may report side effects to FDA at 1-800-FDA-1088. °Where  should I keep my medicine? °This drug is given in a hospital or clinic and will not be stored at home. °NOTE: This sheet is a summary. It may not cover all possible information. If you have questions about this medicine, talk to your doctor, pharmacist, or health care provider. °© 2018 [employer] (2015-09-11 12:41:49) ° ° ° °

## 2016-12-30 NOTE — Progress Notes (Signed)
Call to daughter, Roseanna RainbowWendy Hooker, and advised Dr Shirlee LatchMcLean instructed patient to take Lasix 60 mg daily x 2 days then back to 40 mg daily.  Needs BMET in 1 week and followup CHF clinic which is scheduled for 01/04/2017.  She verbalized understanding.  ICM remote transmission changed to 01/11/2017.

## 2016-12-30 NOTE — Telephone Encounter (Signed)
Pt's daughter left VM on triage line earlier afternoon stating pt was in for iron infusion and BP was 94/45 at start and was down to 88/40.  Pt was asymptomatic.  Returned to call to daughter, she states pt BP was low but he was not having any dizziness/lightheadedness, per chart pt's Corvue has been elevated lately and Lasix was increased.  Daughter states she was called about that today so pt has not started increased dose yet.  She states his wt is down but he has quite a bit of swelling on LE and increased sob.  Given low bp and increased fluid, appt sch w/Dr Shirlee LatchMcLean for tomorrow at 2 pm.

## 2016-12-31 ENCOUNTER — Encounter (HOSPITAL_COMMUNITY): Payer: Self-pay

## 2016-12-31 ENCOUNTER — Ambulatory Visit (HOSPITAL_COMMUNITY)
Admission: RE | Admit: 2016-12-31 | Discharge: 2016-12-31 | Disposition: A | Discharge status: Home / Self Care | Payer: Medicare Other | Source: Ambulatory Visit | Attending: Cardiology | Admitting: Cardiology

## 2016-12-31 VITALS — BP 91/46 | HR 64 | Ht 69.0 in | Wt 128.0 lb

## 2016-12-31 DIAGNOSIS — Z7902 Long term (current) use of antithrombotics/antiplatelets: Secondary | ICD-10-CM | POA: Diagnosis not present

## 2016-12-31 DIAGNOSIS — I5022 Chronic systolic (congestive) heart failure: Secondary | ICD-10-CM | POA: Diagnosis not present

## 2016-12-31 DIAGNOSIS — Z951 Presence of aortocoronary bypass graft: Secondary | ICD-10-CM | POA: Insufficient documentation

## 2016-12-31 DIAGNOSIS — Z7982 Long term (current) use of aspirin: Secondary | ICD-10-CM | POA: Insufficient documentation

## 2016-12-31 DIAGNOSIS — I255 Ischemic cardiomyopathy: Secondary | ICD-10-CM | POA: Diagnosis present

## 2016-12-31 DIAGNOSIS — I252 Old myocardial infarction: Secondary | ICD-10-CM | POA: Insufficient documentation

## 2016-12-31 DIAGNOSIS — F329 Major depressive disorder, single episode, unspecified: Secondary | ICD-10-CM | POA: Diagnosis not present

## 2016-12-31 DIAGNOSIS — D509 Iron deficiency anemia, unspecified: Secondary | ICD-10-CM | POA: Insufficient documentation

## 2016-12-31 DIAGNOSIS — I340 Nonrheumatic mitral (valve) insufficiency: Secondary | ICD-10-CM | POA: Diagnosis not present

## 2016-12-31 DIAGNOSIS — I25810 Atherosclerosis of coronary artery bypass graft(s) without angina pectoris: Secondary | ICD-10-CM

## 2016-12-31 DIAGNOSIS — I350 Nonrheumatic aortic (valve) stenosis: Secondary | ICD-10-CM | POA: Diagnosis not present

## 2016-12-31 DIAGNOSIS — M545 Low back pain: Secondary | ICD-10-CM | POA: Diagnosis not present

## 2016-12-31 DIAGNOSIS — E785 Hyperlipidemia, unspecified: Secondary | ICD-10-CM | POA: Diagnosis not present

## 2016-12-31 DIAGNOSIS — I714 Abdominal aortic aneurysm, without rupture: Secondary | ICD-10-CM | POA: Insufficient documentation

## 2016-12-31 DIAGNOSIS — I2510 Atherosclerotic heart disease of native coronary artery without angina pectoris: Secondary | ICD-10-CM | POA: Diagnosis present

## 2016-12-31 DIAGNOSIS — Z8249 Family history of ischemic heart disease and other diseases of the circulatory system: Secondary | ICD-10-CM | POA: Insufficient documentation

## 2016-12-31 NOTE — Progress Notes (Signed)
Patient ID: Andre Wilson, male   DOB: 07/12/1936, 81 y.o.   MRN: [id number]   PCP: Dr. Arthor Captain Cardiology: Dr. Shirlee Latch  81 yo with history of CAD s/p CABG, ischemic cardiomyopathy, and recent GI bleed presents for cardiology followup after hospitalization.  Patient has been followed in the past in [place].  He had CABG back in 1987.  He had an admission in [place] in 3/17 with GI bleeding.  Per patient and daughter, bleeding was severe but he did not have endoscopy.  He is back on ASA 81 and Plavix.    He was admitted to [facility] in 4/17 with acute on chronic systolic CHF.  After hospitalization, he had a VT arrest.  Echo showed EF 25-30% with moderately decreased RV function, probably moderate AS, and mod-severe MR.  LHC was done, showing 3/4 occluded bypass grafts.  LIMA-LAD was patent but had 70-75% anastomotic stenosis.  He was medically managed. RHC showed near-normal filling pressures.  St Jude ICD was placed.   Admitted 01/18/16 with anterior STEMI, TnI to 45.  Repeat LHC showed similar anatomy to 4/17 LHC with diffusely diseased RCA, totally occluded LCx and LAD, and 75% anastomotic lesion at LIMA touchdown. Touchdown lesion was likely the culprit. His last 2 admissions were likely ischemia-related, VT arrest in 4/17 and anterior STEMI in 5/17. He had successful PCI to the LIMA-LAD anastomotic lesion on 01/20/16. Discharge weight was 140 pounds.  RHC in 6/17 showed preserved cardiac output and normal filling pressures.   He had peripheral angiography in 8/17 showing occluded bilateral SFAs and significant bilateral CIA disease.  No good percutaneous options, and probably a poor candidate for aortobifemoral bypass.  He has been following with Dr. Kirke Corin.   He presents today for follow up. Generally not doing well.  Lasix increased recently due to lower thoracic impedance on Corevue reading.  He was on Lasix 40 mg daily and just started on Lasix 60 mg daily x 2 days.  He is short of  breath and fatigued after walking about 20 feet.  He came in a wheelchair today. He is also limited by leg fatigue and claudication.  No chest pain.  No PND.  He has slept on 3 pillow chronically.  Weight is down another 4 lbs.  Not eating well.   Labs (4/17): K 4.5, creatinine 1.42, LDL 120, HCT 31.7 Labs (5/17): K 4.3, creatinine 1.49, BNP 645, TSH normal, HCT 38.8, LFTs normal Labs (01/27/2016):  K 5.2, Creatinine 1.68, BNP 688, Hgb 10.9  Labs (8/17): K 5.1, creatinine 1.4, hgb 10.6 Labs (12/17): LDL 77, HDL 42 Labs (2/18): K 137, K 5.3, creatinine 1.89, LFTs normal, hgb 10.1 Labs (5/18): K 4, creatinine 2.5, hgb 9.8  PMH: 1. CAD: S/p CABG 1987.  - LHC (4/17) with total occlusion of vein grafts x 3.  LIMA-LAD patent with 70-75% anastomotic stenosis.  Total occlusion of the LAD, LCx, and diffuse disease throughout the RCA.  He was managed medically.  - Admission 5/17 with anterior STEMI, troponin to 45.  LHC in 5/17 showed similar anatomy to 4/17 with diffusely diseased RCA, totally occluded LCx and LAD, and 75% anastomotic lesion at LIMA touchdown. Touchdown lesion was likely the culprit. He had successful PCI to the LIMA-LAD anastomotic lesion on 01/20/16. 2. Chronic systolic CHF: Ischemic cardiomyopathy.  St Jude ICD.  - Echo (4/17): EF 25-30%, inferolateral akinesis, moderately decreased RV systolic function, ?moderate aortic stenosis but mean gradient 10 mmHg, moderate to severe MR.  - RHC (  4/17): mean RA 2, PA 42/14, mean PCWP 15, CI 2.09. - Echo (5/17): EF 25-30%, anteroseptal AK, mild AS, mild AI, moderate MR, PASP 38 mmHg.  - RHC (6/17): mean RA 1, PA 41/14 mean 24, mean PCWP 10, CI 2.33, PVR 3.3 WU.  - Echo (5/18): EF 25%, mild LV dilation, mild RV dilation with mild-moderately decreased RV systolic function, moderate TR, moderate MR (likely functional), cannot rule out low gradient severe AS with AVA 0.63 cm^2 and mean gradient 23 mmHg.  3. History of GI bleeding: 3/17.  Per report,  bleeding was severe, but he did not have endoscopy done.   4. PAD: 11/16 cath showed occluded bilateral SFAs.   - Lower extremity dopplers (5/17) with bilateral SFA occlusions, > 50% left external iliac stenosis.  - PV angiography in 8/17 with totally occluded bilateral SFAs, significant bilateral CIA disease => medical treatment.  5. VT: 4/17 admit with VT, loss of consciousness.  6. Hyperlipidemia 7. Aortic stenosis: Probably moderate on 4/17 echo, mild on 5/17 echo.  Concern for low gradient severe AS on 5/18 echo.  8. Mitral regurgitation: Moderate to severe on 4/17 echo, suspect infarct-related.  Moderate on 5/17 echo.  Moderate functional MR on 5/18 echo.  9. AAA: Abdominal US 5/17 with 4.5 cm AAA.  Abdominal US (3/18) with 4 cm AAA.  10. Low back pain 11. Depression 12. Fe deficiency anemia.   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  Nonsmoker (since college years), no ETOH.   FH: CAD  Review of systems complete and found to be negative unless listed in HPI.    Current Outpatient Prescriptions  Medication Sig Dispense Refill  . amiodarone (PACERONE) 200 MG tablet Take 1 tablet (200 mg total) by mouth daily. 30 tablet 3  . aspirin EC 81 MG EC tablet Take 1 tablet (81 mg total) by mouth daily. 30 tablet 3  . clopidogrel (PLAVIX) 75 MG tablet take 1 tablet by mouth once daily 90 tablet 3  . digoxin (LANOXIN) 0.125 MG tablet Take 1 tab every other day ALTERNATING with 0.5 tab every other day    . ezetimibe (ZETIA) 10 MG tablet take 1 tablet by mouth once daily 90 tablet 1  . ferrous sulfate 325 (65 FE) MG EC tablet Take 1 tablet (325 mg total) by mouth 3 (three) times daily with meals. (Patient taking differently: Take 325 mg by mouth 2 (two) times daily with a meal. ) 90 tablet 3  . folic acid (FOLVITE) 1 MG tablet take 1 tablet by mouth once daily 30 tablet 3  . furosemide (LASIX) 40 MG tablet Take 1 tablet (40 mg total) by mouth daily. 90 tablet 3   . Investigational - Study Medication Take 1 tablet by mouth 2 (two) times daily. Enrolled in Galactic Clinical Trial - Omecamtiv Mecarbil or Placebo    . isosorbide-hydrALAZINE (BIDIL) 20-37.5 MG tablet Take 0.5 tablets by mouth 3 (three) times daily. 45 tablet 3  . latanoprost (XALATAN) 0.005 % ophthalmic solution Place 1 drop into both eyes at bedtime.  0  . linaclotide (LINZESS) 145 MCG CAPS capsule Take 1 capsule (145 mcg total) by mouth daily as needed (for constipation). Reported on 01/01/2016 30 capsule 3  . nitroGLYCERIN (NITROSTAT) 0.4 MG SL tablet Place 0.4 mg under the tongue every 5 (five) minutes as needed for chest pain.    Marland Kitchen. PARoxetine (PAXIL) 10 MG tablet Take 1 tablet (10 mg total) by mouth daily. 30 tablet 6  .  promethazine (PHENERGAN) 12.5 MG tablet Take 1 tablet (12.5 mg total) by mouth every 6 (six) hours as needed for nausea or vomiting. 30 tablet 0  . RANEXA 500 MG 12 hr tablet take 1 tablet by mouth twice a day 60 tablet 5  . rosuvastatin (CRESTOR) 40 MG tablet Take 1 tablet (40 mg total) by mouth daily. 90 tablet 3  . sertraline (ZOLOFT) 50 MG tablet Take 1 tablet (50 mg total) by mouth daily. 30 tablet 1  . spironolactone (ALDACTONE) 25 MG tablet take 1/2 tablet by mouth every morning 15 tablet 6  . timolol (TIMOPTIC) 0.5 % ophthalmic solution Place 1 drop into both eyes 2 (two) times daily.  0  . traMADol (ULTRAM) 50 MG tablet 1/2 tab po q day as needed for pain 5 tablet 0  . traZODone (DESYREL) 50 MG tablet Take 1/2 or 1 at bedtime as needed for sleep 90 tablet 3  . vitamin C (ASCORBIC ACID) 500 MG tablet Take 1 tablet (500 mg total) by mouth 2 (two) times daily. 60 tablet 3   No current facility-administered medications for this encounter.    BP (!) 91/46   Pulse 64   Ht 5\' 9"  (1.753 m)   Wt 128 lb (58.1 kg)   SpO2 98%   BMI 18.90 kg/m    Wt Readings from Last 3 Encounters:  12/31/16 128 lb (58.1 kg)  12/30/16 130 lb (59 kg)  12/23/16 130 lb (59 kg)    General: Thin, chronically ill-appearing.  HEENT: Normal.  Neck: supple. JVD 7 cm. Carotids 2+ bilat; no bruits. No thyromegaly or nodule noted. Cor: PMI nondisplaced. RRR. 2/6 SEM RUSB with clear S2, 3/6 SEM RUSB, S2 still heard clearly. Trace ankle edema.  Lungs: CTAB, normal effort. Abdomen: Soft, NT, ND, no HSM. No bruits or masses. +BS  Extremities: no cyanosis, clubbing, rash, edema  Neuro: Alert & oriented x 3. Cranial nerves grossly intact. Moves all 4 extremities w/o difficulty. Affect pleasant    Assessment/Plan: 1. CAD: s/p CABG 1987.  4/17 cath showed all SVGs occluded.  The LIMA-LAD had a 70-75% anastomotic lesion.  Readmitted with anterior STEMI in 5/17.  Repeat cath showed similar anatomy to 4/17 cath.  Had PCI 01/20/16 to LIMA-LAD anastomotic lesion.  No further chest pain.  - Continue ASA 81 and Plavix.  - Continue Crestor and Zetia, good lipids recently.   - He can continue Ranexa.  2. Chronic systolic CHF: Ischemic cardiomyopathy.  EF 25% with mild-moderate RV dysfunction on 5/18 echo. Has St Jude ICD.  He does not look volume overloaded on exam though Corevue recently showed falling thoracic impedance.  NYHA class III symptoms.  - Off coreg due to profound fatigue with beta blocker, improved off beta blocker. Would leave off beta blocker, especially with soft BP. - Continue digoxin 0.125 mg daily alternating with daily dose of 0.0625 mg daily. Arrange to check digoxin level.  - Continue Bidil 1/2 tab tid and spironolactone 12.5 daily. No room to up-titrate with soft pressure.  - Continue Lasix 60 mg daily x 2 days then back to 40 mg daily long-term.  Pending BMET from yesterday.  - Continue Galactic study drug.  3. PAD: Leg weakness/fatigue/pain is a major complaint.  Peripheral angiography by Dr Kirke Corin in 8/17, report above.  Plan medical management => no percutaneous option and very high risk for aortobifemoral bypass.  4. VT: Likely scar-mediated. Has a St Jude ICD.  -  Continue amiodarone 200 mg daily.  Check TSH, LFTs at followup appt in 2 wks. Will need regular eye exams with amiodarone use.  5. Anemia: Fe-deficiency.  GI bleeding of uncertain etiology in 3/17.  CBC stable recently.  He had IV Fe recently.   6. Aortic stenosis:  Looking at today's echo, I am concerned for possible low gradient severe aortic stenosis.  This may be contributing to his symptomatic worsening.   - I will arrange for low dose dobutamine stress echo to confirm low gradient severe AS.  If he does have severe AS, he would not be a good candidate for open surgery.  TAVR would also be difficult given his history of severe vascular disease.   7. Mitral regurgitation: Moderate, probably functional.  8. AAA: 4.0 cm 10/2016. Repeat 1 year.     Marca Ancona, MD  01/02/2017

## 2016-12-31 NOTE — Patient Instructions (Signed)
Take Furosemide (Lasix) 60 mg (1 & 1/2 tabs) today then back to 40 mg daily STARTING tomorrow  Your physician has requested that you have a dobutamine echocardiogram. For further information please visit [email or web address]. Please follow instruction sheet as given.  Your physician recommends that you schedule a follow-up appointment in: 2 weeks

## 2017-01-03 ENCOUNTER — Other Ambulatory Visit (HOSPITAL_COMMUNITY): Payer: Medicare Other

## 2017-01-03 ENCOUNTER — Telehealth: Payer: Self-pay | Admitting: Family Medicine

## 2017-01-03 NOTE — Telephone Encounter (Signed)
Caller name:Wendy Relationship to patient: Can be reached:[phone] Pharmacy:  Reason for call:Requesting a referral to Home Health, needs PA on Paxil 10mg , please call [phone] for PA

## 2017-01-04 ENCOUNTER — Encounter (HOSPITAL_COMMUNITY): Payer: Medicare Other

## 2017-01-04 ENCOUNTER — Telehealth: Payer: Self-pay | Admitting: Emergency Medicine

## 2017-01-04 NOTE — Telephone Encounter (Signed)
PA for Paxil 10 MG tablet. Case # [id number]. Informed that there is a 72 hr turnaround time. Determination will be faxed over once made.

## 2017-01-04 NOTE — Telephone Encounter (Signed)
PA for Paxil 10 MG tablet. Case # [id number]. Informed that there is a 72 hr turnaround time. Determination will be faxed over once made.  

## 2017-01-05 ENCOUNTER — Other Ambulatory Visit: Payer: Self-pay | Admitting: Medical

## 2017-01-05 NOTE — Telephone Encounter (Signed)
Received denial letter for Paroxetine Tab 10 MG. Sent to provider for review.

## 2017-01-06 ENCOUNTER — Ambulatory Visit (INDEPENDENT_AMBULATORY_CARE_PROVIDER_SITE_OTHER): Payer: Self-pay

## 2017-01-06 DIAGNOSIS — Z95810 Presence of automatic (implantable) cardiac defibrillator: Secondary | ICD-10-CM

## 2017-01-06 DIAGNOSIS — I5022 Chronic systolic (congestive) heart failure: Secondary | ICD-10-CM

## 2017-01-06 NOTE — Progress Notes (Signed)
EPIC Encounter for ICM Monitoring  Patient Name: Andre Wilson is a 81 y.o. male Date: 01/06/2017 Primary Care Physican: Darreld Mclean, MD Primary Cardiologist:McLean Electrophysiologist: Curt Bears Nephrologist: Lyn Henri Weight:unknown      Spoke to daughter [name].  Heart Failure questions reviewed and feet are less swollen after the increase in Furosemide x 2 days at Dr Claris Gladden office on 5/11.  She said he remains weak and had 2 falls in the last couple of days, no significant injuries but is sore.    Thoracic impedance returned to normal.  PrescribedFurosemide to 40 mg 1 tablet daily.  Labs: 12/23/2016 Creatinine 2.56, BUN 23, Potassium 4.0, Sodium 135 12/14/2016 Creatinine 2.42, BUN 27, Potassium 4.2, Sodium 133, EGFR 33.48 12/09/2016 Creatinine 2.35, BUN 27, Potassium 4.8, Sodium 134, EGFR 34.63 11/26/2016 Creatinine 2.19, BUN 28, Potassium 4.1, Sodium 138, EGFR 37.57 11/05/2016 Creatinine 2.03, BUN 31, Potassium 4.7, Sodium 138, EGFR 30-34 10/29/2016 Creatinine 2.34, BUN 42, Potassium 4.9, Sodium 137, EGFR 25-29  10/15/2016 Creatinine 2.07, BUN 32, Potassium 4.6, Sodium 138, EGFR 29-34  10/02/2016 Creatinine 1.89, BUN 29, Potassium 5.3, Sodium 137, EGFR 42  09/16/2016 Creatinine 2.01, BUN 27, Potassium 5.3, Sodium 138, EGFR 30-35 09/02/2016 Creatinine 2.02, BUN 27, Potassium 4.6, Sodium 138, EGFR 30-35 07/22/2016 Creatinine 1.92, BUN 24, Potassium 5.2, Sodium 139, EGFR 32-37 06/10/2016 Creatinine 1.54, BUN 22, Potassium 5.2, Sodium 137, EGFR 42-48  04/09/2016 Creatinine 1.40, BUN 21, Potassium 5.1, Sodium 139  02/10/2016 Creatinine 1.46, BUN 23, Potassium 5.0, Sodium 132, EGFR 45-52  01/27/2016 Creatinine 1.68, BUN 22, Potassium 5.2, Sodium 135, EGFR 38-44  01/22/2016 Creatinine 1.42, BUN 24, Potassium 4.7, Sodium 135, EGFR 46-53  01/21/2016 Creatinine 1.59, BUN 30, Potassium 4.5, Sodium 136, EGFR 40-47  01/20/2016 Creatinine 1.35, BUN 30, Potassium 4.5,  Sodium 135, EGFR 49-57  01/19/2016 Creatinine 1.54, BUN 29, Potassium 4.6, Sodium 135, EGFR 42-48  01/18/2016 Creatinine 1.99, BUN 30, Potassium 5.1, Sodium 137, EGFR 31-36 01/12/2016 Creatinine 1.54, BUN 21, Potassium 4.6, Sodium 139, EGFR 42-48  01/01/2016 Creatinine 1.49, BUN 28, Potassium 4.3, Sodium 135  12/26/2015 Creatinine 1.48, BUN 20, Potassium 4.5, Sodium 136, EGFR 44-51  Recommendations: No changes.   Encouraged to call for fluid symptoms or use local ER for any urgent symptoms.  Follow-up plan: ICM clinic phone appointment on 01/27/2017.  Office appointment scheduled on 01/20/2017 and 02/09/2017 with HF clinic.  Copy of ICM check sent to primary cardiologist and device physician.   3 month ICM trend: 01/06/2017   1 Year ICM trend:      Rosalene Billings, RN 01/06/2017 9:41 AM

## 2017-01-10 ENCOUNTER — Telehealth: Payer: Self-pay | Admitting: Family Medicine

## 2017-01-10 ENCOUNTER — Telehealth (HOSPITAL_COMMUNITY): Payer: Self-pay

## 2017-01-10 DIAGNOSIS — R531 Weakness: Secondary | ICD-10-CM

## 2017-01-10 DIAGNOSIS — I5022 Chronic systolic (congestive) heart failure: Secondary | ICD-10-CM

## 2017-01-10 NOTE — Telephone Encounter (Addendum)
Caller name: Toniann FailWendy Relation to pt: daughter  Call back [phone]   Reason for call:  Daughter checking on the status of message below, patient would like to know alternate medications, please Francee Piccoloadivse

## 2017-01-10 NOTE — Telephone Encounter (Signed)
Done Called his daughter Toniann FailWendy and let her know that I did this referral Also I got notification that his paxil was denied by [employer]. Advised wendy that it may be worth checking on the cash price of this medication.  Please let me know how this works out for them

## 2017-01-10 NOTE — Telephone Encounter (Signed)
Caller name: Toniann FailWendy  Relation to VW:UJWJXBJYpt:daughter  Call back number: [phone]   Reason for call:  Daughter requesting orders for [facility] for home health aid assistance, due to patient falling and daily living, please advise

## 2017-01-10 NOTE — Telephone Encounter (Signed)
Daughter calling CHF clinic to request referral order placed for patient to have HHRN/PT/OT. Order placed per Dr. Shirlee LatchMcLean, [facility]  Ave FilterBradley, Megan Genevea, RN

## 2017-01-11 ENCOUNTER — Encounter (HOSPITAL_COMMUNITY): Payer: Self-pay

## 2017-01-11 NOTE — Progress Notes (Signed)
[facility], OT, and aid (if patient qualifies). Patient's daughter made aware.  Ave FilterBradley, Megan Genevea, RN

## 2017-01-12 ENCOUNTER — Ambulatory Visit
Admission: RE | Admit: 2017-01-12 | Discharge: 2017-01-12 | Disposition: A | Discharge status: Home / Self Care | Payer: Medicare Other | Source: Ambulatory Visit | Attending: Nephrology | Admitting: Nephrology

## 2017-01-12 ENCOUNTER — Telehealth: Payer: Self-pay | Admitting: Family Medicine

## 2017-01-12 ENCOUNTER — Telehealth: Payer: Self-pay | Admitting: Cardiology

## 2017-01-12 DIAGNOSIS — N183 Chronic kidney disease, stage 3 unspecified: Secondary | ICD-10-CM

## 2017-01-12 DIAGNOSIS — N179 Acute kidney failure, unspecified: Secondary | ICD-10-CM

## 2017-01-12 MED ORDER — TRAMADOL HCL 50 MG PO TABS: ORAL_TABLET | ORAL | 0 refills | Status: AC

## 2017-01-12 NOTE — Telephone Encounter (Signed)
Returned daughters call to get more information about patients head aches. Left message on answering machine for return call.

## 2017-01-12 NOTE — Telephone Encounter (Signed)
[facility] made aware ok to start Saturday.

## 2017-01-12 NOTE — Telephone Encounter (Signed)
Called his daughter- they have tried tylenol and advil PM- however he is still in pain with headaches and tailbone pain from a recent fall. Would rather avoid NSAIDs if possible as his renal function is not good He has used tramadol in the recent past and did ok with this-  However his creat clearance is 20- called to discuss with pharmD.  Ok to use a lower dose, max 100 per day Will try 25 BID for him, will call in  He has been having headaches for the last 4 days or so.  He has refused the ER and we cannot find a time when they can come in to see me tomorrow.  Urged his daughter to take him to the ER if she feels that he is not ok and she agrees to do so. He has not hit his head when he fell recently  Also discussed his paxil- denied by insurance and I am not sure if we can successfully appeal.  His daughter will check on cash price and get back to me on this  Max daily dose would be 100 daily

## 2017-01-12 NOTE — Progress Notes (Signed)
Patient's daughter called triage line again today saying she still hasn't heard anything from home health.  I verified with Aundra MilletMegan that referral was sent to [facility] and that she should be hearing something from them.  I called daughter back and made her aware, explained that if she doesn't hear anything from [facility] to call us back in the next 2 days. No further questions at this time.

## 2017-01-12 NOTE — Telephone Encounter (Signed)
Caller name: Toniann FailWendy Relationship to patient: Daughter Can be reached: [phone] Pharmacy:  Reason for call: Patient's daughter request call back to discuss headaches that patient is having. She needs to know what she can give him. Plse adv

## 2017-01-12 NOTE — Telephone Encounter (Signed)
New message    [facility] She said they received a referral for pt but with their staffing will not be able to start with pt until Saturday. She is calling to find out if this is ok?

## 2017-01-13 ENCOUNTER — Encounter (HOSPITAL_COMMUNITY)
Admission: RE | Admit: 2017-01-13 | Discharge: 2017-01-13 | Disposition: A | Discharge status: Home / Self Care | Payer: Medicare Other | Source: Ambulatory Visit | Attending: Nephrology | Admitting: Nephrology

## 2017-01-13 ENCOUNTER — Telehealth (HOSPITAL_COMMUNITY): Payer: Self-pay | Admitting: *Deleted

## 2017-01-13 DIAGNOSIS — N183 Chronic kidney disease, stage 3 unspecified: Secondary | ICD-10-CM

## 2017-01-13 DIAGNOSIS — D631 Anemia in chronic kidney disease: Secondary | ICD-10-CM | POA: Insufficient documentation

## 2017-01-13 LAB — POCT HEMOGLOBIN-HEMACUE: Hemoglobin: 8.5 g/dL — ABNORMAL LOW (ref 13.0–17.0)

## 2017-01-13 MED ORDER — EPOETIN ALFA 20000 UNIT/ML IJ SOLN
20000.0000 [IU] | INTRAMUSCULAR | Status: DC
  Administered 2017-01-13: 11:00:00 20000 [IU] via SUBCUTANEOUS

## 2017-01-13 MED ORDER — EPOETIN ALFA 20000 UNIT/ML IJ SOLN
INTRAMUSCULAR | Status: AC
  Filled 2017-01-13: qty 1

## 2017-01-13 NOTE — Telephone Encounter (Signed)
Patient given detailed instructions per Stress Test Requisition Sheet for test on 01/19/17 at 7:30.Patient Notified to arrive 30 minutes early, and that it is imperative to arrive on time for appointment to keep from having the test rescheduled.  Patient verbalized understanding. Andre DolinSharon S Brooks

## 2017-01-14 ENCOUNTER — Telehealth (HOSPITAL_COMMUNITY): Payer: Self-pay | Admitting: Cardiology

## 2017-01-14 ENCOUNTER — Telehealth: Payer: Self-pay | Admitting: Cardiology

## 2017-01-14 NOTE — Telephone Encounter (Signed)
New message    [facility]y Batheresa from [facility] is calling stating that she was going to start pt today. But his daughter never called back and didn't answer when Rosey Batheresa called. She said he may get added Saturday but it may be Sunday before he starts.

## 2017-01-14 NOTE — Telephone Encounter (Signed)
Patients daughter called with concerns regarding HH services. Daughter reports she will NOT accept services through [facility] as they will not be able to provide all services family is looking for. [facility], OT and nurse aide. [phone]

## 2017-01-18 ENCOUNTER — Other Ambulatory Visit: Payer: Self-pay | Admitting: Cardiology

## 2017-01-19 ENCOUNTER — Other Ambulatory Visit (HOSPITAL_COMMUNITY): Payer: Medicare Other

## 2017-01-20 ENCOUNTER — Telehealth: Payer: Self-pay | Admitting: Family Medicine

## 2017-01-20 ENCOUNTER — Encounter (HOSPITAL_COMMUNITY): Payer: Medicare Other

## 2017-01-20 NOTE — Telephone Encounter (Signed)
Received call from ChamaGelayne with home health. She states pt daughter Toniann Failwendy hooker refused them to treat pt. Daughter says pt needs an aid to help with bathing and personal care and home care not therapy or exercises.

## 2017-01-22 DIAGNOSIS — I110 Hypertensive heart disease with heart failure: Secondary | ICD-10-CM | POA: Diagnosis not present

## 2017-01-22 DIAGNOSIS — I2510 Atherosclerotic heart disease of native coronary artery without angina pectoris: Secondary | ICD-10-CM | POA: Diagnosis not present

## 2017-01-22 DIAGNOSIS — I350 Nonrheumatic aortic (valve) stenosis: Secondary | ICD-10-CM | POA: Diagnosis not present

## 2017-01-22 DIAGNOSIS — I5022 Chronic systolic (congestive) heart failure: Secondary | ICD-10-CM | POA: Diagnosis not present

## 2017-01-24 ENCOUNTER — Telehealth: Payer: Self-pay | Admitting: Family Medicine

## 2017-01-24 NOTE — Telephone Encounter (Signed)
Caller name:[facility] Relation to ZO:XWRUEAVWpt:Physical Therapist Call back [phone] Pharmacy:  Reason for call: requesting a verbal order for home health physical therapy services twice a week for 4 wks

## 2017-01-26 ENCOUNTER — Telehealth: Payer: Self-pay | Admitting: *Deleted

## 2017-01-26 NOTE — Telephone Encounter (Signed)
Received call on (12/22/16) [facility] stating that they received Fecal occult blood imunochemical from the pt.  She stated that the pt only returned 1 card,and they were unable to read the card because it had to much stool on it.  She stated that the pt will need to repeat the cards.  Called and spoke with the pt's daughter and informed her of the message and she stated that she did not come with the pt the day he came in and received the cards.  She stated that she will come by and pickup some more of the cards and have the pt repeat them.  She asked about instruction for the cards,I explained how to collect the stool on the cards.  She verbalized understanding and agreed.//AB/CMA

## 2017-01-26 NOTE — Telephone Encounter (Signed)
Called [facility] back. Verbal orders given for requested home health physical therapy services twice a week for 4 wks.

## 2017-01-27 ENCOUNTER — Ambulatory Visit (INDEPENDENT_AMBULATORY_CARE_PROVIDER_SITE_OTHER): Payer: Medicare Other

## 2017-01-27 ENCOUNTER — Telehealth (HOSPITAL_COMMUNITY): Payer: Self-pay | Admitting: *Deleted

## 2017-01-27 ENCOUNTER — Encounter (HOSPITAL_COMMUNITY)
Admission: RE | Admit: 2017-01-27 | Discharge: 2017-01-27 | Disposition: A | Discharge status: Home / Self Care | Payer: Medicare Other | Source: Ambulatory Visit | Attending: Nephrology | Admitting: Nephrology

## 2017-01-27 ENCOUNTER — Other Ambulatory Visit (HOSPITAL_COMMUNITY): Payer: Medicare Other

## 2017-01-27 DIAGNOSIS — Z95810 Presence of automatic (implantable) cardiac defibrillator: Secondary | ICD-10-CM

## 2017-01-27 DIAGNOSIS — N183 Chronic kidney disease, stage 3 unspecified: Secondary | ICD-10-CM

## 2017-01-27 DIAGNOSIS — I5022 Chronic systolic (congestive) heart failure: Secondary | ICD-10-CM

## 2017-01-27 DIAGNOSIS — D631 Anemia in chronic kidney disease: Secondary | ICD-10-CM | POA: Diagnosis not present

## 2017-01-27 LAB — POCT HEMOGLOBIN-HEMACUE: Hemoglobin: 10.7 g/dL — ABNORMAL LOW (ref 13.0–17.0)

## 2017-01-27 MED ORDER — EPOETIN ALFA 20000 UNIT/ML IJ SOLN
INTRAMUSCULAR | Status: DC
Start: 2017-01-27 — End: 2017-01-28
  Filled 2017-01-27: qty 1

## 2017-01-27 MED ORDER — EPOETIN ALFA 20000 UNIT/ML IJ SOLN
20000.0000 [IU] | INTRAMUSCULAR | Status: DC
  Administered 2017-01-27: 13:00:00 20000 [IU] via SUBCUTANEOUS

## 2017-01-27 MED ORDER — FERUMOXYTOL INJECTION 510 MG/17 ML
510.0000 mg | INTRAVENOUS | Status: AC
  Administered 2017-01-27: 13:00:00 510 mg via INTRAVENOUS
  Filled 2017-01-27: qty 17

## 2017-01-27 NOTE — Progress Notes (Signed)
EPIC Encounter for ICM Monitoring  Patient Name: Andre Wilson is a 81 y.o. male Date: 01/27/2017 Primary Care Physican: Darreld Mclean, MD Primary Cardiologist:McLean Electrophysiologist: Curt Bears Nephrologist: Lyn Henri Weight:unknown                                                Spoke to daughter [name].  Heart Failure questions reviewed, pt still has a little swelling in the feet but not much.   Thoracic impedance normal   PrescribedFurosemide to 40 mg 1 tablet daily.  Labs: 12/23/2016 Creatinine 2.56, BUN 23, Potassium 4.0, Sodium 135 12/14/2016 Creatinine 2.42, BUN 27, Potassium 4.2, Sodium 133, EGFR 33.48 12/09/2016 Creatinine 2.35, BUN 27, Potassium 4.8, Sodium 134, EGFR 34.63 11/26/2016 Creatinine 2.19, BUN 28, Potassium 4.1, Sodium 138, EGFR 37.57 11/05/2016 Creatinine 2.03, BUN 31, Potassium 4.7, Sodium 138, EGFR 30-34 10/29/2016 Creatinine 2.34, BUN 42, Potassium 4.9, Sodium 137, EGFR 25-29  10/15/2016 Creatinine 2.07, BUN 32, Potassium 4.6, Sodium 138, EGFR 29-34  10/02/2016 Creatinine 1.89, BUN 29, Potassium 5.3, Sodium 137, EGFR 42  09/16/2016 Creatinine 2.01, BUN 27, Potassium 5.3, Sodium 138, EGFR 30-35 09/02/2016 Creatinine 2.02, BUN 27, Potassium 4.6, Sodium 138, EGFR 30-35 07/22/2016 Creatinine 1.92, BUN 24, Potassium 5.2, Sodium 139, EGFR 32-37 06/10/2016 Creatinine 1.54, BUN 22, Potassium 5.2, Sodium 137, EGFR 42-48  04/09/2016 Creatinine 1.40, BUN 21, Potassium 5.1, Sodium 139  02/10/2016 Creatinine 1.46, BUN 23, Potassium 5.0, Sodium 132, EGFR 45-52  01/27/2016 Creatinine 1.68, BUN 22, Potassium 5.2, Sodium 135, EGFR 38-44  01/22/2016 Creatinine 1.42, BUN 24, Potassium 4.7, Sodium 135, EGFR 46-53  01/21/2016 Creatinine 1.59, BUN 30, Potassium 4.5, Sodium 136, EGFR 40-47  01/20/2016 Creatinine 1.35, BUN 30, Potassium 4.5, Sodium 135, EGFR 49-57  01/19/2016 Creatinine 1.54, BUN 29, Potassium 4.6, Sodium 135, EGFR 42-48  01/18/2016 Creatinine  1.99, BUN 30, Potassium 5.1, Sodium 137, EGFR 31-36 01/12/2016 Creatinine 1.54, BUN 21, Potassium 4.6, Sodium 139, EGFR 42-48  01/01/2016 Creatinine 1.49, BUN 28, Potassium 4.3, Sodium 135  12/26/2015 Creatinine 1.48, BUN 20, Potassium 4.5, Sodium 136, EGFR 44-51  Recommendations: No changes. Encouraged to call for fluid symptoms.  Follow-up plan: ICM clinic phone appointment on 03/03/2017.   Copy of ICM check sent to device physician.   3 month ICM trend: 01/27/2017   1 Year ICM trend:      Rosalene Billings, RN 01/27/2017 1:50 PM

## 2017-01-27 NOTE — Telephone Encounter (Signed)
Patient's daughter given detailed instructions per Stress Test Requisition Sheet for test on 01/31/17 at 7:30. Patient Notified to arrive 30 minutes early, and that it is imperative to arrive on time for appointment to keep from having the test rescheduled.  Patient verbalized understanding. Ishan ChimesSharon Brooks

## 2017-01-28 ENCOUNTER — Other Ambulatory Visit (HOSPITAL_COMMUNITY): Payer: Self-pay | Admitting: Cardiology

## 2017-01-31 ENCOUNTER — Telehealth: Payer: Self-pay | Admitting: *Deleted

## 2017-01-31 ENCOUNTER — Encounter (INDEPENDENT_AMBULATORY_CARE_PROVIDER_SITE_OTHER): Payer: Self-pay

## 2017-01-31 ENCOUNTER — Ambulatory Visit (HOSPITAL_BASED_OUTPATIENT_CLINIC_OR_DEPARTMENT_OTHER): Payer: Medicare Other

## 2017-01-31 ENCOUNTER — Ambulatory Visit (HOSPITAL_COMMUNITY): Payer: Medicare Other | Attending: Cardiology

## 2017-01-31 DIAGNOSIS — I252 Old myocardial infarction: Secondary | ICD-10-CM | POA: Diagnosis not present

## 2017-01-31 DIAGNOSIS — Z950 Presence of cardiac pacemaker: Secondary | ICD-10-CM | POA: Diagnosis not present

## 2017-01-31 DIAGNOSIS — Z87898 Personal history of other specified conditions: Secondary | ICD-10-CM | POA: Insufficient documentation

## 2017-01-31 DIAGNOSIS — I350 Nonrheumatic aortic (valve) stenosis: Secondary | ICD-10-CM

## 2017-01-31 DIAGNOSIS — E785 Hyperlipidemia, unspecified: Secondary | ICD-10-CM | POA: Diagnosis not present

## 2017-01-31 DIAGNOSIS — Z951 Presence of aortocoronary bypass graft: Secondary | ICD-10-CM | POA: Insufficient documentation

## 2017-01-31 DIAGNOSIS — I080 Rheumatic disorders of both mitral and aortic valves: Secondary | ICD-10-CM | POA: Diagnosis not present

## 2017-01-31 DIAGNOSIS — I255 Ischemic cardiomyopathy: Secondary | ICD-10-CM | POA: Diagnosis not present

## 2017-01-31 DIAGNOSIS — Z8249 Family history of ischemic heart disease and other diseases of the circulatory system: Secondary | ICD-10-CM | POA: Insufficient documentation

## 2017-01-31 DIAGNOSIS — I509 Heart failure, unspecified: Secondary | ICD-10-CM | POA: Diagnosis not present

## 2017-01-31 DIAGNOSIS — I110 Hypertensive heart disease with heart failure: Secondary | ICD-10-CM | POA: Diagnosis not present

## 2017-01-31 MED ORDER — SODIUM CHLORIDE 0.9 % IV SOLN
10.0000 ug/kg/min | INTRAVENOUS | Status: AC
  Administered 2017-01-31: 20 ug/kg/min via INTRAVENOUS

## 2017-01-31 NOTE — Telephone Encounter (Signed)
Received Physician Orders from [employer] for supplies for urinary incontinence d/t CHF, forwarded to provider/SLS 06/11

## 2017-01-31 NOTE — Telephone Encounter (Signed)
Received Client Coordination Note Report; No signature required, forwarded to provider/SLS 06/11

## 2017-02-06 ENCOUNTER — Other Ambulatory Visit (HOSPITAL_COMMUNITY): Payer: Self-pay | Admitting: Student

## 2017-02-09 ENCOUNTER — Encounter (HOSPITAL_COMMUNITY): Payer: Self-pay

## 2017-02-09 ENCOUNTER — Ambulatory Visit (HOSPITAL_COMMUNITY)
Admission: RE | Admit: 2017-02-09 | Discharge: 2017-02-09 | Disposition: A | Discharge status: Home / Self Care | Payer: Medicare Other | Source: Ambulatory Visit | Attending: Internal Medicine | Admitting: Internal Medicine

## 2017-02-09 VITALS — BP 108/54 | HR 64 | Wt 127.0 lb

## 2017-02-09 DIAGNOSIS — I255 Ischemic cardiomyopathy: Secondary | ICD-10-CM | POA: Insufficient documentation

## 2017-02-09 DIAGNOSIS — Z7902 Long term (current) use of antithrombotics/antiplatelets: Secondary | ICD-10-CM | POA: Diagnosis not present

## 2017-02-09 DIAGNOSIS — E7800 Pure hypercholesterolemia, unspecified: Secondary | ICD-10-CM | POA: Diagnosis not present

## 2017-02-09 DIAGNOSIS — E785 Hyperlipidemia, unspecified: Secondary | ICD-10-CM | POA: Diagnosis not present

## 2017-02-09 DIAGNOSIS — I350 Nonrheumatic aortic (valve) stenosis: Secondary | ICD-10-CM | POA: Diagnosis not present

## 2017-02-09 DIAGNOSIS — I252 Old myocardial infarction: Secondary | ICD-10-CM | POA: Diagnosis not present

## 2017-02-09 DIAGNOSIS — Z8249 Family history of ischemic heart disease and other diseases of the circulatory system: Secondary | ICD-10-CM | POA: Diagnosis not present

## 2017-02-09 DIAGNOSIS — I2582 Chronic total occlusion of coronary artery: Secondary | ICD-10-CM | POA: Diagnosis not present

## 2017-02-09 DIAGNOSIS — I739 Peripheral vascular disease, unspecified: Secondary | ICD-10-CM | POA: Insufficient documentation

## 2017-02-09 DIAGNOSIS — I25810 Atherosclerosis of coronary artery bypass graft(s) without angina pectoris: Secondary | ICD-10-CM

## 2017-02-09 DIAGNOSIS — I2510 Atherosclerotic heart disease of native coronary artery without angina pectoris: Secondary | ICD-10-CM | POA: Diagnosis present

## 2017-02-09 DIAGNOSIS — I714 Abdominal aortic aneurysm, without rupture: Secondary | ICD-10-CM | POA: Diagnosis not present

## 2017-02-09 DIAGNOSIS — M545 Low back pain: Secondary | ICD-10-CM | POA: Insufficient documentation

## 2017-02-09 DIAGNOSIS — D509 Iron deficiency anemia, unspecified: Secondary | ICD-10-CM | POA: Diagnosis not present

## 2017-02-09 DIAGNOSIS — Z7982 Long term (current) use of aspirin: Secondary | ICD-10-CM | POA: Insufficient documentation

## 2017-02-09 DIAGNOSIS — I5022 Chronic systolic (congestive) heart failure: Secondary | ICD-10-CM | POA: Diagnosis not present

## 2017-02-09 DIAGNOSIS — Z951 Presence of aortocoronary bypass graft: Secondary | ICD-10-CM | POA: Insufficient documentation

## 2017-02-09 DIAGNOSIS — Z95810 Presence of automatic (implantable) cardiac defibrillator: Secondary | ICD-10-CM | POA: Insufficient documentation

## 2017-02-09 DIAGNOSIS — I080 Rheumatic disorders of both mitral and aortic valves: Secondary | ICD-10-CM | POA: Insufficient documentation

## 2017-02-09 DIAGNOSIS — R531 Weakness: Secondary | ICD-10-CM | POA: Insufficient documentation

## 2017-02-09 DIAGNOSIS — N183 Chronic kidney disease, stage 3 (moderate): Secondary | ICD-10-CM | POA: Insufficient documentation

## 2017-02-09 DIAGNOSIS — F329 Major depressive disorder, single episode, unspecified: Secondary | ICD-10-CM | POA: Insufficient documentation

## 2017-02-09 LAB — COMPREHENSIVE METABOLIC PANEL
ALK PHOS: 54 U/L (ref 38–126)
ALT: 61 U/L (ref 17–63)
AST: 44 U/L — AB (ref 15–41)
Albumin: 3.4 g/dL — ABNORMAL LOW (ref 3.5–5.0)
Anion gap: 8 (ref 5–15)
BILIRUBIN TOTAL: 0.7 mg/dL (ref 0.3–1.2)
BUN: 35 mg/dL — AB (ref 6–20)
CALCIUM: 9.3 mg/dL (ref 8.9–10.3)
CO2: 26 mmol/L (ref 22–32)
CREATININE: 1.94 mg/dL — AB (ref 0.61–1.24)
Chloride: 101 mmol/L (ref 101–111)
GFR, EST AFRICAN AMERICAN: 36 mL/min — AB (ref >60)
GFR, EST NON AFRICAN AMERICAN: 31 mL/min — AB (ref >60)
Glucose, Bld: 91 mg/dL (ref 65–99)
Potassium: 4.3 mmol/L (ref 3.5–5.1)
Sodium: 135 mmol/L (ref 135–145)
Total Protein: 7.1 g/dL (ref 6.5–8.1)

## 2017-02-09 LAB — CBC
HCT: 33.4 % — ABNORMAL LOW (ref 39.0–52.0)
Hemoglobin: 11 g/dL — ABNORMAL LOW (ref 13.0–17.0)
MCH: 29.8 pg (ref 26.0–34.0)
MCHC: 32.9 g/dL (ref 30.0–36.0)
MCV: 90.5 fL (ref 78.0–100.0)
PLATELETS: 206 10*3/uL (ref 150–400)
RBC: 3.69 MIL/uL — ABNORMAL LOW (ref 4.22–5.81)
RDW: 17.6 % — AB (ref 11.5–15.5)
WBC: 6.4 10*3/uL (ref 4.0–10.5)

## 2017-02-09 LAB — LIPID PANEL
CHOLESTEROL: 131 mg/dL (ref 0–200)
HDL: 36 mg/dL — ABNORMAL LOW (ref >40)
LDL CALC: 82 mg/dL (ref 0–99)
TRIGLYCERIDES: 63 mg/dL (ref <150)
Total CHOL/HDL Ratio: 3.6 RATIO
VLDL: 13 mg/dL (ref 0–40)

## 2017-02-09 LAB — TSH: TSH: 2.449 u[IU]/mL (ref 0.350–4.500)

## 2017-02-09 LAB — DIGOXIN LEVEL: Digoxin Level: 1.7 ng/mL (ref 0.8–2.0)

## 2017-02-09 NOTE — Progress Notes (Signed)
Patient ID: Andre Wilson, male   DOB: 1935/12/18, 81 y.o.   MRN: [id number]   PCP: Dr. Arthor Captain Cardiology: Dr. Shirlee Latch  81 yo with history of CAD s/p CABG, ischemic cardiomyopathy, aortic stenosis, PAD, and h/o GI bleed presents for cardiology followup.  Patient was followed in the past in [place].  He had CABG back in 1987.  He had an admission in [place] in 3/17 with GI bleeding.  Per patient and daughter, bleeding was severe but he did not have endoscopy.  He is back on ASA 81 and Plavix.    He was admitted to [facility] in 4/17 with acute on chronic systolic CHF.  After hospitalization, he had a VT arrest.  Echo showed EF 25-30% with moderately decreased RV function, probably moderate AS, and mod-severe MR.  LHC was done, showing 3/4 occluded bypass grafts.  LIMA-LAD was patent but had 70-75% anastomotic stenosis.  He was medically managed. RHC showed near-normal filling pressures.  St Jude ICD was placed.   Admitted 01/18/16 with anterior STEMI, TnI to 45.  Repeat LHC showed similar anatomy to 4/17 LHC with diffusely diseased RCA, totally occluded LCx and LAD, and 75% anastomotic lesion at LIMA touchdown. Touchdown lesion was likely the culprit. His last 2 admissions were likely ischemia-related, VT arrest in 4/17 and anterior STEMI in 5/17. He had successful PCI to the LIMA-LAD anastomotic lesion on 01/20/16. Discharge weight was 140 pounds.  RHC in 6/17 showed preserved cardiac output and normal filling pressures.   He had peripheral angiography in 8/17 showing occluded bilateral SFAs and significant bilateral CIA disease.  No good percutaneous options, and a poor candidate for aortobifemoral bypass.  He has been following with Dr. Kirke Corin.   He presents today for follow up. He has been doing worse recently.  More fatigued.  Losing weight.  He has bilateral calf claudication if he walks more than about 30 feet and his feet feel numb.  No pedal ulcers.  Appetite poor.  No  lightheadedness, falls, palpitations.  No chest pain.  He is short of breath walking around his house with his walker.  This is stable.  Weight is down 1 lb.  Recent echo suggested low gradient severe aortic stenosis.  This was confirmed by low dose DSE earlier this month.   Corevue: Stable impedance, not suggestive of volume overload.   Labs (4/17): K 4.5, creatinine 1.42, LDL 120, HCT 31.7 Labs (5/17): K 4.3, creatinine 1.49, BNP 645, TSH normal, HCT 38.8, LFTs normal Labs (01/27/2016):  K 5.2, Creatinine 1.68, BNP 688, Hgb 10.9  Labs (8/17): K 5.1, creatinine 1.4, hgb 10.6 Labs (12/17): LDL 77, HDL 42 Labs (2/18): K 137, K 5.3, creatinine 1.89, LFTs normal, hgb 10.1 Labs (5/18): K 4, creatinine 2.5 => 2.56, hgb 9.8  PMH: 1. CAD: S/p CABG 1987.  - LHC (4/17) with total occlusion of vein grafts x 3.  LIMA-LAD patent with 70-75% anastomotic stenosis.  Total occlusion of the LAD, LCx, and diffuse disease throughout the RCA.  He was managed medically.  - Admission 5/17 with anterior STEMI, troponin to 45.  LHC in 5/17 showed similar anatomy to 4/17 with diffusely diseased RCA, totally occluded LCx and LAD, and 75% anastomotic lesion at LIMA touchdown. Touchdown lesion was likely the culprit. He had successful PCI to the LIMA-LAD anastomotic lesion on 01/20/16. 2. Chronic systolic CHF: Ischemic cardiomyopathy.  St Jude ICD.  - Echo (4/17): EF 25-30%, inferolateral akinesis, moderately decreased RV systolic function, ?moderate aortic stenosis but  mean gradient 10 mmHg, moderate to severe MR.  - RHC (4/17): mean RA 2, PA 42/14, mean PCWP 15, CI 2.09. - Echo (5/17): EF 25-30%, anteroseptal AK, mild AS, mild AI, moderate MR, PASP 38 mmHg.  - RHC (6/17): mean RA 1, PA 41/14 mean 24, mean PCWP 10, CI 2.33, PVR 3.3 WU.  - Echo (5/18): EF 25%, mild LV dilation, mild RV dilation with mild-moderately decreased RV systolic function, moderate TR, moderate MR (likely functional), cannot rule out low gradient  severe AS with AVA 0.63 cm^2 and mean gradient 23 mmHg.  3. History of GI bleeding: 3/17.  Per report, bleeding was severe, but he did not have endoscopy done.   4. PAD: 11/16 cath showed occluded bilateral SFAs.   - Lower extremity dopplers (5/17) with bilateral SFA occlusions, > 50% left external iliac stenosis.  - PV angiography in 8/17 with totally occluded bilateral SFAs, significant bilateral CIA disease => medical treatment.  5. VT: 4/17 admit with VT, loss of consciousness.  6. Hyperlipidemia 7. Aortic stenosis: Probably moderate on 4/17 echo, mild on 5/17 echo.  Concern for low gradient severe AS on 5/18 echo.  - Low dose DSE (6/18): AVA remained 0.5 cm^2 with dobutamine, mean gradient increased to 32 mmHg. Suspect low gradient severe AS.  8. Mitral regurgitation: Moderate to severe on 4/17 echo, suspect infarct-related.  Moderate on 5/17 echo.  Moderate functional MR on 5/18 echo.  9. AAA: Abdominal US 5/17 with 4.5 cm AAA.  Abdominal US (3/18) with 4 cm AAA.  10. Low back pain 11. Depression 12. Fe deficiency anemia.   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  Nonsmoker (since college years), no ETOH.   FH: CAD  Review of systems complete and found to be negative unless listed in HPI.    Current Outpatient Prescriptions  Medication Sig Dispense Refill  . amiodarone (PACERONE) 200 MG tablet Take 1 tablet (200 mg total) by mouth daily. 30 tablet 3  . aspirin EC 81 MG EC tablet Take 1 tablet (81 mg total) by mouth daily. 30 tablet 3  . clopidogrel (PLAVIX) 75 MG tablet take 1 tablet by mouth once daily 90 tablet 3  . digoxin (LANOXIN) 0.125 MG tablet Take 1 tab every other day ALTERNATING with 0.5 tab every other day    . ezetimibe (ZETIA) 10 MG tablet take 1 tablet by mouth once daily 90 tablet 1  . ferrous sulfate 325 (65 FE) MG EC tablet Take 1 tablet (325 mg total) by mouth 3 (three) times daily with meals. (Patient taking differently: Take  325 mg by mouth 2 (two) times daily with a meal. ) 90 tablet 3  . folic acid (FOLVITE) 1 MG tablet take 1 tablet by mouth once daily 30 tablet 11  . furosemide (LASIX) 40 MG tablet Take 1 tablet (40 mg total) by mouth daily. 90 tablet 3  . Investigational - Study Medication Take 1 tablet by mouth 2 (two) times daily. Enrolled in [employer] Clinical Trial - Omecamtiv Mecarbil or Placebo    . isosorbide-hydrALAZINE (BIDIL) 20-37.5 MG tablet Take 0.5 tablets by mouth 3 (three) times daily. 45 tablet 3  . latanoprost (XALATAN) 0.005 % ophthalmic solution Place 1 drop into both eyes at bedtime.  0  . linaclotide (LINZESS) 145 MCG CAPS capsule Take 1 capsule (145 mcg total) by mouth daily as needed (for constipation). Reported on 01/01/2016 30 capsule 3  . PARoxetine (PAXIL) 10 MG tablet Take 1 tablet (10  mg total) by mouth daily. 30 tablet 6  . promethazine (PHENERGAN) 12.5 MG tablet Take 1 tablet (12.5 mg total) by mouth every 6 (six) hours as needed for nausea or vomiting. 30 tablet 0  . RANEXA 500 MG 12 hr tablet take 1 tablet by mouth twice a day 60 tablet 5  . rosuvastatin (CRESTOR) 40 MG tablet Take 1 tablet (40 mg total) by mouth daily. 90 tablet 3  . sertraline (ZOLOFT) 50 MG tablet Take 1 tablet (50 mg total) by mouth daily. 30 tablet 1  . spironolactone (ALDACTONE) 25 MG tablet take 1/2 tablet by mouth every morning 15 tablet 6  . timolol (TIMOPTIC) 0.5 % ophthalmic solution Place 1 drop into both eyes 2 (two) times daily.  0  . traMADol (ULTRAM) 50 MG tablet 1/2 tab po every 12 hours as needed for pain 20 tablet 0  . traZODone (DESYREL) 50 MG tablet Take 1/2 or 1 at bedtime as needed for sleep 90 tablet 3  . vitamin C (ASCORBIC ACID) 500 MG tablet Take 1 tablet (500 mg total) by mouth 2 (two) times daily. 60 tablet 3  . nitroGLYCERIN (NITROSTAT) 0.4 MG SL tablet Place 0.4 mg under the tongue every 5 (five) minutes as needed for chest pain.     No current facility-administered medications for  this encounter.    BP (!) 108/54   Pulse 64   Wt 127 lb (57.6 kg)   SpO2 100%   BMI 18.49 kg/m    Wt Readings from Last 3 Encounters:  02/09/17 127 lb (57.6 kg)  01/27/17 128 lb (58.1 kg)  12/31/16 128 lb (58.1 kg)   General: Thin, chronically ill-appearing.  HEENT: Normal.  Neck: supple. JVP not elevated. Carotids 2+ bilat; no bruits. No thyromegaly or nodule noted. Cor: PMI nondisplaced. RRR. 3/6 SEM RUSB, S2 still heard clearly. 1+ bilateral ankle edema. Unable to palpate pedal pulses.  Lungs: CTAB, normal effort. Abdomen: Soft, NT, ND, no HSM. No bruits or masses. +BS  Extremities: no cyanosis, clubbing, rash.  No ulcers on feet.  Neuro: Alert & oriented x 3. Cranial nerves grossly intact. Moves all 4 extremities w/o difficulty. Affect pleasant    Assessment/Plan: 1. CAD: s/p CABG 1987.  4/17 cath showed all SVGs occluded.  The LIMA-LAD had a 70-75% anastomotic lesion.  Readmitted with anterior STEMI in 5/17.  Repeat cath showed similar anatomy to 4/17 cath.  Had PCI 01/20/16 to LIMA-LAD anastomotic lesion.  No further chest pain.  - Continue ASA 81 and Plavix.  - Continue Crestor and Zetia, good lipids recently.   - He can continue Ranexa.  2. Chronic systolic CHF: Ischemic cardiomyopathy.  EF 25% with mild-moderate RV dysfunction on 5/18 echo. Has St Jude ICD. He does not look volume overloaded on exam or by Corevue.  NYHA class III symptoms, prominent fatigue, suspect a degree of low cardiac output.  Symptoms worsened slowly over the last few months without weight gain.  May be related to severe aortic stenosis.  - Off coreg due to profound fatigue with beta blocker, improved off beta blocker. Would leave off beta blocker, especially with soft BP. - Continue digoxin 0.125 mg daily alternating with daily dose of 0.0625 mg daily. Check digoxin level today.  - Continue Bidil 1/2 tab tid and spironolactone 12.5 daily. No room to up-titrate with soft pressure.  - Continue Lasix 40  mg daily and check BMET today.   - Continue [employer] study drug.  3. PAD: Leg weakness/fatigue/pain is  a major complaint.  Peripheral angiography by Dr Kirke CorinArida in 8/17, report above.  Plan medical management => no percutaneous option and very high risk for aortobifemoral bypass.  4. VT: Likely scar-mediated. Has a St Jude ICD.  - Continue amiodarone 200 mg daily.  Check TSH, LFTs today. Will need regular eye exams with amiodarone use.  5. Anemia: Fe-deficiency.  GI bleeding of uncertain etiology in 3/17.  Check CBC today.    6. Aortic stenosis:  Suspect low gradient severe aortic stenosis.  This likely is a significant contributor to his symptomatic worsening.  He would be a poor candidate for traditional surgical AVR.  TAVR, however, is also going to be a difficult proposition.  He has severe PAD and I am concerned that femoral access may not be feasible.  He also has CKD stage 3 and is frail.  Trans-apical or trans-aortic approach would likely be feasible but higher risk.  - I will have him see Dr. Excell Seltzerooper for a TAVR evaluation.  I told him that I am not sure that he will be a candidate. Will hold off on cardiac cath at this point given risk from contrast load.  If we decide on TAVR, however, will need.  7. Mitral regurgitation: Moderate, probably functional.  8. AAA: 4.0 cm 10/2016. Repeat 1 year.     Followup in 1 month.   Marca Anconaalton McLean, MD  02/09/2017

## 2017-02-09 NOTE — Patient Instructions (Signed)
Labs today  You have been referred to Dr Copper  Your physician recommends that you schedule a follow-up appointment in: 1 month

## 2017-02-10 ENCOUNTER — Telehealth: Payer: Self-pay | Admitting: *Deleted

## 2017-02-10 ENCOUNTER — Telehealth (HOSPITAL_COMMUNITY): Payer: Self-pay | Admitting: *Deleted

## 2017-02-10 ENCOUNTER — Encounter (HOSPITAL_COMMUNITY): Payer: Medicare Other

## 2017-02-10 MED ORDER — DIGOXIN 125 MCG PO TABS: 0.0625 mg | ORAL_TABLET | ORAL | Status: DC

## 2017-02-10 NOTE — Telephone Encounter (Signed)
Notes recorded by Noralee SpaceSchub, Vidalia Serpas M, RN on 02/10/2017 at 5:03 PM EDT Pt's daughter aware and verbalizes understanding, med list updated ------  Notes recorded by Noralee SpaceSchub, Flint Hakeem M, RN on 02/09/2017 at 5:22 PM EDT Attempted to call pt's daughter, no answer and no VM ------  Notes recorded by Laurey MoraleMcLean, Dalton S, MD on 02/09/2017 at 4:57 PM EDT Digoxin level is high. Creatinine is better. Hold digoxin x 1 day then decrease dose to 0.0625 mg every other day. Call today.

## 2017-02-10 NOTE — Telephone Encounter (Signed)
Received Physician Orders from [facility], forwarded to provider/SLS 06/21

## 2017-02-12 ENCOUNTER — Encounter: Payer: Self-pay | Admitting: Family Medicine

## 2017-02-12 DIAGNOSIS — N183 Chronic kidney disease, stage 3 (moderate): Secondary | ICD-10-CM

## 2017-02-12 DIAGNOSIS — D631 Anemia in chronic kidney disease: Secondary | ICD-10-CM | POA: Insufficient documentation

## 2017-02-14 ENCOUNTER — Telehealth: Payer: Self-pay | Admitting: *Deleted

## 2017-02-14 NOTE — Telephone Encounter (Signed)
[facility], forwarded to provider/SLS 06/25

## 2017-02-21 ENCOUNTER — Telehealth: Payer: Self-pay

## 2017-02-21 NOTE — Telephone Encounter (Signed)
lmira with [facility] called to notify Dr. Patsy Lageropland patient fell yesterday with no injuries and they would like to continue PT on patient. Per Dr. Patsy Lageropland this will be ok. Al[place]ira notified.

## 2017-02-24 ENCOUNTER — Ambulatory Visit: Payer: Medicare Other | Admitting: Cardiovascular Disease

## 2017-02-28 ENCOUNTER — Other Ambulatory Visit: Payer: Self-pay | Admitting: Cardiology

## 2017-03-01 ENCOUNTER — Telehealth: Payer: Self-pay | Admitting: *Deleted

## 2017-03-01 NOTE — Telephone Encounter (Signed)
[facility], forwarded to provider/SLS 07/10

## 2017-03-02 ENCOUNTER — Ambulatory Visit: Payer: Medicare Other | Admitting: Family Medicine

## 2017-03-03 ENCOUNTER — Ambulatory Visit: Payer: Medicare Other | Admitting: Family Medicine

## 2017-03-03 ENCOUNTER — Ambulatory Visit (INDEPENDENT_AMBULATORY_CARE_PROVIDER_SITE_OTHER): Payer: Medicare Other | Admitting: Cardiovascular Disease

## 2017-03-03 ENCOUNTER — Encounter: Payer: Self-pay | Admitting: Cardiovascular Disease

## 2017-03-03 ENCOUNTER — Ambulatory Visit (INDEPENDENT_AMBULATORY_CARE_PROVIDER_SITE_OTHER): Payer: Medicare Other | Admitting: *Deleted

## 2017-03-03 ENCOUNTER — Ambulatory Visit (INDEPENDENT_AMBULATORY_CARE_PROVIDER_SITE_OTHER): Payer: Medicare Other

## 2017-03-03 ENCOUNTER — Telehealth: Payer: Self-pay

## 2017-03-03 ENCOUNTER — Encounter (INDEPENDENT_AMBULATORY_CARE_PROVIDER_SITE_OTHER): Payer: Self-pay

## 2017-03-03 VITALS — BP 84/58 | HR 75 | Ht 69.5 in | Wt 128.0 lb

## 2017-03-03 DIAGNOSIS — I350 Nonrheumatic aortic (valve) stenosis: Secondary | ICD-10-CM | POA: Diagnosis not present

## 2017-03-03 DIAGNOSIS — I5022 Chronic systolic (congestive) heart failure: Secondary | ICD-10-CM | POA: Diagnosis not present

## 2017-03-03 DIAGNOSIS — I255 Ischemic cardiomyopathy: Secondary | ICD-10-CM | POA: Diagnosis not present

## 2017-03-03 DIAGNOSIS — Z95810 Presence of automatic (implantable) cardiac defibrillator: Secondary | ICD-10-CM | POA: Diagnosis not present

## 2017-03-03 MED ORDER — CLOPIDOGREL BISULFATE 75 MG PO TABS: 75.0000 mg | ORAL_TABLET | Freq: Every day | ORAL | 6 refills | Status: AC

## 2017-03-03 MED ORDER — AMIODARONE HCL 200 MG PO TABS: 200.0000 mg | ORAL_TABLET | Freq: Every day | ORAL | 6 refills | Status: AC

## 2017-03-03 NOTE — Telephone Encounter (Signed)
Spoke with Roseanna RainbowWendy Hooker, daughter.  She reported patient has not taken Amiodarone 200mg  and Clopidogrel 75 mg for the last few days due to unable to get a refill.  Advised will send message to HF clinic for renewal and she should follow up by phone tomorrow if the prescription has not been filled.  She said there was another physician's name, Dr Isidoro Donningai Ripudeep, a hospitalist, on the bottle and that is the reason why pharmacy could not refill it. The pharmacy is [facility] on [address], [place].

## 2017-03-03 NOTE — Progress Notes (Signed)
Cardiology Office Note Date:  03/04/2017   ID:  Claudell Wohler, DOB 1935/08/24, MRN [id number]  PCP:  Pearline Cables, MD  Cardiologist:  Tonny Bollman, MD    Chief Complaint  Patient presents with  . Fatigue     History of Present Illness: Andre Wilson is a 81 y.o. male who presents for TAVR evaluation, referred by Dr Shirlee Latch. The patient is here with his daughter today. He has a longstanding cardiac history.   The patient underwent CABG at [facility] in 1987. He did well for many years. He reports reasonably good activity level and functional capacity prior to hospitalization for heart failure in April 2017. He has lived in [place] most of his life, but was visiting his daughter locally when he was hospitalized. He had a GI bleed, then later developed acute on chronic heart failure. He had a VT arrest during his hospitalization and ultimately underwent cardiac cath for further assessment. He was sound to have severe LV systolic dysfunction with LVEF 25-30%, severe multivessel CAD with patency of only his LIMA graft, and moderate AS. He underwent ICD implantation. He also underwent PCI of the LIMA-LAD graft at the coronary anastomosis. The patient has been living locally with his daughter since that time.   The patient has undergone echo and dobutamine echo studies as outlined below. The aortic valve is calcified with fusion of the right and left cusps. At baseline the mean transaortic gradient is 20 and at peak dobutamine the gradient increases to 32 mmHg. The LVEF remains severely depressed <30% with dobutamine.   The patient is primarily limited by weakness and le pain. He does admit to dyspnea with modest activity. He has been extremely weak. His daughter initially had to help him even to sit up in bed. He has progressed with PT/OT, but remains limited. He's in a wheelchair today. He doesn't walk outside of the house at all. He uses a walker in the house and is now strong enough to go  from room to room with a walker. He complains of pain and numbness in both legs. The patient denies chest pain or pressure. He denies orthopnea, PND, or edema. He has lost 50# over the past year. He's had 2 recent falls.   Past Medical History:  Diagnosis Date  . Arthritis   . Blood transfusion without reported diagnosis   . CAD (coronary artery disease)   . Cardiac arrest [facility]) secondary to sustained V tach requiring CPR 12/15/15 12/15/2015   s/p [employer] [place] VR (serial Number [id number]) ICD  . Cataract   . CHF (congestive heart failure) (HCC)   . GI bleed   . Glaucoma   . Heart murmur   . Hypercholesteremia   . Hypertension   . MI (myocardial infarction) (HCC)   . Oxygen deficiency   . Sustained VT (ventricular tachycardia) (HCC) 12/15/2015    Past Surgical History:  Procedure Laterality Date  . CARDIAC CATHETERIZATION     CABG 1987  . CARDIAC CATHETERIZATION N/A 12/17/2015   Procedure: Right/Left Heart Cath and Coronary/Graft Angiography;  Surgeon: Lyn Records, MD;  Location: [facility] INVASIVE CV LAB;  Service: Cardiovascular;  Laterality: N/A;  . CARDIAC CATHETERIZATION N/A 01/18/2016   Procedure: Left Heart Cath and Coronary Angiography;  Surgeon: Runell Gess, MD;  Location: [facility] INVASIVE CV LAB;  Service: Cardiovascular;  Laterality: N/A;  . CARDIAC CATHETERIZATION N/A 01/20/2016   Procedure: Coronary Stent Intervention;  Surgeon: Peter M Swaziland, MD;  Location: [facility] INVASIVE CV  LAB;  Service: Cardiovascular;  Laterality: N/A;  . CARDIAC CATHETERIZATION N/A 02/12/2016   Procedure: Right Heart Cath;  Surgeon: Laurey Morale, MD;  Location: [facility] INVASIVE CV LAB;  Service: Cardiovascular;  Laterality: N/A;  . EP IMPLANTABLE DEVICE N/A 12/18/2015   Procedure: ICD Implant;  Surgeon: Will Jorja Loa, MD; [facility] [place] VR (serial Number [id number]) ICD   . PERIPHERAL VASCULAR CATHETERIZATION N/A 04/14/2016   Procedure: Abdominal Aortogram w/Lower Extremity;  Surgeon: Iran Ouch,  MD;  Location: [facility] INVASIVE CV LAB;  Service: Cardiovascular;  Laterality: N/A;  . ULTRASOUND GUIDANCE FOR VASCULAR ACCESS  01/20/2016   Procedure: Ultrasound Guidance For Vascular Access;  Surgeon: Peter M Swaziland, MD;  Location: [facility] INVASIVE CV LAB;  Service: Cardiovascular;;    Current Outpatient Prescriptions  Medication Sig Dispense Refill  . aspirin EC 81 MG EC tablet Take 1 tablet (81 mg total) by mouth daily. 30 tablet 3  . digoxin (LANOXIN) 0.125 MG tablet Take 0.5 tablets (0.0625 mg total) by mouth every other day.    . ezetimibe (ZETIA) 10 MG tablet take 1 tablet by mouth once daily 90 tablet 1  . ferrous sulfate 325 (65 FE) MG EC tablet Take 1 tablet (325 mg total) by mouth 3 (three) times daily with meals. (Patient taking differently: Take 325 mg by mouth 2 (two) times daily with a meal. ) 90 tablet 3  . folic acid (FOLVITE) 1 MG tablet take 1 tablet by mouth once daily 30 tablet 11  . furosemide (LASIX) 40 MG tablet Take 1 tablet (40 mg total) by mouth daily. 90 tablet 3  . Investigational - Study Medication Take 1 tablet by mouth 2 (two) times daily. Enrolled in [employer] Clinical Trial - Omecamtiv Mecarbil or Placebo    . isosorbide-hydrALAZINE (BIDIL) 20-37.5 MG tablet Take 0.5 tablets by mouth 3 (three) times daily. 45 tablet 3  . latanoprost (XALATAN) 0.005 % ophthalmic solution Place 1 drop into both eyes at bedtime.  0  . linaclotide (LINZESS) 145 MCG CAPS capsule Take 1 capsule (145 mcg total) by mouth daily as needed (for constipation). Reported on 01/01/2016 30 capsule 3  . nitroGLYCERIN (NITROSTAT) 0.4 MG SL tablet Place 0.4 mg under the tongue every 5 (five) minutes as needed for chest pain.    Marland Kitchen PARoxetine (PAXIL) 10 MG tablet Take 1 tablet (10 mg total) by mouth daily. 30 tablet 6  . promethazine (PHENERGAN) 12.5 MG tablet Take 1 tablet (12.5 mg total) by mouth every 6 (six) hours as needed for nausea or vomiting. 30 tablet 0  . RANEXA 500 MG 12 hr tablet take 1 tablet by  mouth twice a day 60 tablet 5  . rosuvastatin (CRESTOR) 40 MG tablet Take 1 tablet (40 mg total) by mouth daily. 90 tablet 3  . spironolactone (ALDACTONE) 25 MG tablet take 1/2 tablet by mouth every morning 15 tablet 6  . timolol (TIMOPTIC) 0.5 % ophthalmic solution Place 1 drop into both eyes 2 (two) times daily.  0  . traMADol (ULTRAM) 50 MG tablet 1/2 tab po every 12 hours as needed for pain 20 tablet 0  . traZODone (DESYREL) 50 MG tablet Take 1/2 or 1 at bedtime as needed for sleep 90 tablet 3  . vitamin C (ASCORBIC ACID) 500 MG tablet Take 1 tablet (500 mg total) by mouth 2 (two) times daily. 60 tablet 3  . amiodarone (PACERONE) 200 MG tablet Take 1 tablet (200 mg total) by mouth daily. 30 tablet 6  .  clopidogrel (PLAVIX) 75 MG tablet Take 1 tablet (75 mg total) by mouth daily. 30 tablet 6   No current facility-administered medications for this visit.     Allergies:   Patient has no known allergies.   Social History:  The patient  reports that he has quit smoking. He has never used smokeless tobacco. He reports that he does not drink alcohol or use drugs.   Family History:  The patient's  family history includes Aneurysm in his brother; Heart attack in his father; Heart disease in his brother and brother; Hyperlipidemia in his mother; Hypertension in his brother, brother, and mother.    ROS:  Please see the history of present illness.  Otherwise, review of systems is positive for ankle swelling, chills, abdominal pain, back pain, leg pain, constipation, headaches, gait instability.  All other systems are reviewed and negative.    PHYSICAL EXAM: VS:  BP (!) 84/58   Pulse 75   Ht 5' 9.5" (1.765 m)   Wt 128 lb (58.1 kg)   SpO2 99%   BMI 18.63 kg/m  , BMI Body mass index is 18.63 kg/m. GEN: Frail, elderly male, in no acute distress  HEENT: normal  Neck: no JVD, no masses. No carotid bruits Cardiac: RRR with 2/6 late peaking systolic murmur at the RUSB, no diastolic murmur              Respiratory:  clear to auscultation bilaterally, normal work of breathing GI: soft, nontender, nondistended, + BS MS: no deformity or atrophy  Ext: 1+ bilateral ankle edema, pedal pulses nonpalpable Skin: warm and dry, no rash Neuro:  Strength and sensation are intact Psych: euthymic mood, full affect  EKG:  EKG is ordered today. The ekg ordered today shows NSR with LVH with repolarization abnormality  Recent Labs: 10/15/2016: B Natriuretic Peptide 1,491.0 02/09/2017: ALT 61; BUN 35; Creatinine, Ser 1.94; Hemoglobin 11.0; Platelets 206; Potassium 4.3; Sodium 135; TSH 2.449   Lipid Panel     Component Value Date/Time   CHOL 131 02/09/2017 0951   TRIG 63 02/09/2017 0951   HDL 36 (L) 02/09/2017 0951   CHOLHDL 3.6 02/09/2017 0951   VLDL 13 02/09/2017 0951   LDLCALC 82 02/09/2017 0951      Wt Readings from Last 3 Encounters:  03/03/17 128 lb (58.1 kg)  02/09/17 127 lb (57.6 kg)  01/27/17 128 lb (58.1 kg)     Cardiac Studies Reviewed: Echo 12/23/2016: Study Conclusions  - Left ventricle: The cavity size was mildly dilated. Wall   thickness was normal. The estimated ejection fraction was 25%.   Diffuse hypokinesis. Features are consistent with a pseudonormal   left ventricular filling pattern, with concomitant abnormal   relaxation and increased filling pressure (grade 2 diastolic   dysfunction). E/medial e&' > 15 suggests LV end diastolic pressure   at least 20 mmHg. - Aortic valve: Functionally bicuspid with fused left and right   coronary cusps. Severely calcified leaflets. There was trivial   regurgitation. Possible low gradient severe aortic stenosis. Mean   gradient (S): 23 mm Hg. Peak gradient (S): 41 mm Hg. Valve area   (VTI): 0.63 cm^2. - Mitral valve: Restricted posterior leaflet. There was moderate   regurgitation. - Left atrium: The atrium was moderately dilated. - Right ventricle: The cavity size was mildly dilated. Pacer wire   or catheter noted in right  ventricle. Systolic function was   mildly to moderately reduced. - Right atrium: The atrium was mildly dilated. - Tricuspid valve:  There was moderate regurgitation. Peak RV-RA   gradient (S): 60 mm Hg. - Pulmonary arteries: PA peak pressure: 63 mm Hg (S). - Inferior vena cava: The vessel was normal in size. The   respirophasic diameter changes were in the normal range (= 50%),   consistent with normal central venous pressure.  Impressions:  - Mildly dilated LV with EF 25%, diffuse hypokinesis. Moderate   diastolic dysfunction with evidence for elevated LV filling   pressure. Mildly dilated RV with mild to moderately decreased   systolic function. Moderate tricuspid regurgitation. Moderate   probably functional mitral regurgitation. Severely calcified,   functionally bicuspid aortic valve with fused right and left   coronary cusps. Possible low gradient severe aortic stenosis.   Consider assessment by TEE versus low dose dobutamine stress   echo. Moderate pulmonary hypertension. IVC is not dilated.  Dobutamine stress echo: Study Conclusions  - Stress ECG conclusions: There were no stress arrhythmias or   conduction abnormalities. The stress ECG was negative for   ischemia. - Baseline: The estimated LV ejection fraction was 15-20%.   Peak/mean transaortic gradients were: 36/20 mmHg, AVA 0.51 cm2. - Peak stress: The estimated LV ejection fraction was 20-25%.   Peak/mean transaortic gradients were: 58/32 mmHg, AVA 0.50 cm2. - Low dose: The estimated LV ejection fraction was 15-20%.   Peak/mean transaortic gradients were: 32/20 mmHg, AVA 0.55 cm2.  Impressions:  - Findings are consistent with severe aortic stenosis, while   transaortic gradients are in the moderate to severe range, AVA is   in the severe range.     Moderate mitral and mild aortic regurgitation.  Heart cath 02-12-2016: Cath 12/17/2015: Conclusion   1. Prox LAD to Mid LAD lesion, 100% stenosed. 2. Ost Cx  to Prox Cx lesion, 100% stenosed. 3. Acute Mrg lesion, 70% stenosed. 4. Mid RCA lesion, 70% stenosed. 5. LIMA was injected is normal in caliber, and is anatomically normal. 6. The flow in the graft is reversed. 7. Dist LAD lesion, 75% stenosed. 8. SVG . 9. Origin lesion, 100% stenosed. 10. SVG was injected . 11. Origin lesion, 100% stenosed. 12. SVG . 13. Origin to Prox Graft lesion, 100% stenosed.    Known occlusion of 3 saphenous vein grafts  Patent LIMA to LAD with a distal 70-75% anastomosis stenosis of the LAD. Lesion is calcified.  Total occlusion of the LAD, circumflex, and diffuse ratty disease in the RCA.  Severe left ventricular systolic dysfunction with estimated EF 25%.  Mildly elevated pulmonary artery pressures with normal LV filling pressure.     Right Heart   Right Heart Pressures RHC Procedural Findings: Hemodynamics (mmHg) RA mean 1 RV 40/3 PA 41/14, mean 24 PCWP mean 10  Oxygen saturations: PA 60% AO 100%  Cardiac Output (Fick) 4.17  Cardiac Index (Fick) 2.33 PVR 3.3 WU      ASSESSMENT AND PLAN: 81 yo debilitated male with severe, low-flow low-gradient aortic stenosis. I have reviewed the patient's cath, peripheral angio, and echo studies. He has very severe LV dysfunction with at least moderate MR and TR. His aortic valve is functionally bicuspid with fusion of the left and right cusps. The noncoronary leaflet moves fairly well.   I have reviewed the natural history of aortic stenosis with the patient and his daughter who is present today. We have discussed the limitations of medical therapy and the poor prognosis associated with symptomatic aortic stenosis. We have reviewed potential treatment options, including palliative medical therapy, conventional surgical aortic valve replacement, and  transcatheter aortic valve replacement. We discussed treatment options in the context of this patient's specific comorbid medical conditions.   The patient  has very poor overall health and I think he has a poor prognosis regardless of whether his aortic stenosis is treated. He has lost 50# unintentionally and now has severe protein calorie malnutrition. He is nearly wheelchair bound. He has significant kidney disease with most recent creatinine 1.94 mg/dL. He has advanced heart failure and severe occlusive lower extremity PAD that would not allow for transfemoral access for TAVR (severe BL common iliac stenosis - angio images reviewed). Considering all of these factors, I would favor palliative medical therapy for this patient. It is worth noting that he has had slow steady improvement over the past few months. I am going to see him back in follow-up in 2 months to reassess him. If he has made significant improvement in his strength and nutritional status he could be reconsidered for TAVR, but I suspect this is unlikely.   All of their questions are answered today.   Current medicines are reviewed with the patient today.  The patient does not have concerns regarding medicines.  Labs/ tests ordered today include:   Orders Placed This Encounter  Procedures  . EKG 12-Lead    Disposition:   FU 2 months  Signed, Tonny Bollman, MD  03/04/2017 6:34 AM    [facility] [address] [place], [place], [place]  [address] Phone: [phone]; Fax: [phone]

## 2017-03-03 NOTE — Telephone Encounter (Signed)
Prescriptions sent in, left VM for pt's daughter that this was done

## 2017-03-03 NOTE — Addendum Note (Signed)
Addended by: Noralee SpaceSCHUB, Kieanna Rollo M on: 03/03/2017 03:29 PM   Modules accepted: Orders

## 2017-03-03 NOTE — Progress Notes (Signed)
EPIC Encounter for ICM Monitoring  Patient Name: Andre Wilson is a 81 y.o. male Date: 03/03/2017 Primary Care Physican: Darreld Mclean, MD Primary Cardiologist:McLean Electrophysiologist: Curt Bears Nephrologist: Lyn Henri Weight:unknown      Spoke with daughter Andre Wilson.  Heart Failure questions reviewed, patients feet are always swollen but since he has been elevating them above heart level they do improve.     Thoracic impedance is at baseline today but has been abnormal suggesting fluid accumulation from 02/04/2017 to 03/02/2017.  Patient has been eating salty foods.    PrescribedFurosemide to 40 mg 1 tablet daily.  Labs: 12/23/2016 Creatinine 2.56, BUN 23, Potassium 4.0, Sodium 135 12/14/2016 Creatinine 2.42, BUN 27, Potassium 4.2, Sodium 133, EGFR 33.48 12/09/2016 Creatinine 2.35, BUN 27, Potassium 4.8, Sodium 134, EGFR 34.63 11/26/2016 Creatinine 2.19, BUN 28, Potassium 4.1, Sodium 138, EGFR 37.57 11/05/2016 Creatinine 2.03, BUN 31, Potassium 4.7, Sodium 138, EGFR 30-34 10/29/2016 Creatinine 2.34, BUN 42, Potassium 4.9, Sodium 137, EGFR 25-29  10/15/2016 Creatinine 2.07, BUN 32, Potassium 4.6, Sodium 138, EGFR 29-34  10/02/2016 Creatinine 1.89, BUN 29, Potassium 5.3, Sodium 137, EGFR 42  09/16/2016 Creatinine 2.01, BUN 27, Potassium 5.3, Sodium 138, EGFR 30-35 09/02/2016 Creatinine 2.02, BUN 27, Potassium 4.6, Sodium 138, EGFR 30-35 07/22/2016 Creatinine 1.92, BUN 24, Potassium 5.2, Sodium 139, EGFR 32-37 06/10/2016 Creatinine 1.54, BUN 22, Potassium 5.2, Sodium 137, EGFR 42-48   Recommendations:  Daughter reported patient has not taken Amiodarone 200 mg and Clopidogrel 75 mg for the last few days due to unable to get a refill.  Advised will send message to HF clinic for renewal and she should follow up by phone tomorrow if the prescription has not been filled.  She said there was another physician's name, Dr Tana Coast Ripudeep on the bottle and that is the reason why  pharmacy could not refill it.   Follow-up plan: ICM clinic phone appointment on 03/14/2017 to recheck fluid levels.  Office appointment scheduled 03/24/2017 with Dr. Aundra Dubin.  Copy of ICM check sent to Dr Aundra Dubin and Dr Curt Bears.    3 month ICM trend: 03/03/2017   1 Year ICM trend:      Rosalene Billings, RN 03/03/2017 1:57 PM

## 2017-03-03 NOTE — Progress Notes (Signed)
Remote transmission 

## 2017-03-03 NOTE — Patient Instructions (Addendum)
Medication Instructions:  Your physician recommends that you continue on your current medications as directed. Please refer to the Current Medication list given to you today.  Please request refills from CHF clinic (Dr McLean/Dr Gala RomneyBensimhon)  Labwork: No new orders.   Testing/Procedures: No new orders.   Follow-Up: Your physician recommends that you schedule a follow-up appointment in: 2 MONTHS with Dr Excell Seltzerooper   Any Other Special Instructions Will Be Listed Below (If Applicable).  If you need a refill on your cardiac medications before your next appointment, please call your pharmacy.

## 2017-03-08 ENCOUNTER — Encounter: Payer: Self-pay | Admitting: Cardiology

## 2017-03-09 ENCOUNTER — Other Ambulatory Visit (HOSPITAL_COMMUNITY): Payer: Self-pay | Admitting: *Deleted

## 2017-03-10 ENCOUNTER — Encounter (HOSPITAL_COMMUNITY)
Admission: RE | Admit: 2017-03-10 | Discharge: 2017-03-10 | Disposition: A | Discharge status: Home / Self Care | Payer: Medicare Other | Source: Ambulatory Visit | Attending: Nephrology | Admitting: Nephrology

## 2017-03-10 DIAGNOSIS — D631 Anemia in chronic kidney disease: Secondary | ICD-10-CM | POA: Diagnosis not present

## 2017-03-10 DIAGNOSIS — N183 Chronic kidney disease, stage 3 unspecified: Secondary | ICD-10-CM

## 2017-03-10 LAB — IRON AND TIBC
IRON: 48 ug/dL (ref 45–182)
Saturation Ratios: 19 % (ref 17.9–39.5)
TIBC: 253 ug/dL (ref 250–450)
UIBC: 205 ug/dL

## 2017-03-10 LAB — POCT HEMOGLOBIN-HEMACUE: Hemoglobin: 9.4 g/dL — ABNORMAL LOW (ref 13.0–17.0)

## 2017-03-10 LAB — FERRITIN: FERRITIN: 591 ng/mL — AB (ref 24–336)

## 2017-03-10 MED ORDER — EPOETIN ALFA 20000 UNIT/ML IJ SOLN
20000.0000 [IU] | INTRAMUSCULAR | Status: DC
  Administered 2017-03-10: 09:00:00 20000 [IU] via SUBCUTANEOUS

## 2017-03-10 MED ORDER — EPOETIN ALFA 20000 UNIT/ML IJ SOLN
INTRAMUSCULAR | Status: AC
  Filled 2017-03-10: qty 1

## 2017-03-17 NOTE — Progress Notes (Signed)
No ICM remote transmission received for 03/14/2017 to recheck fluid levels and next ICM transmission scheduled for 04/05/2017.

## 2017-03-21 NOTE — Progress Notes (Signed)
[facility] [address] [place], [place] [address] [phone]  Date:  03/23/2017   Name:  Andre Wilson   DOB:  [birth year]   MRN:  [id number]  PCP:  Pearline Cables, MD    Chief Complaint: Follow-up (Pt here for f/u visit. )   History of Present Illness:  Andre Wilson is a 81 y.o. very pleasant male patient who presents with the following:  Here today for a follow-up visit.  Accompanied by his son in law today History of severe aortic stenosis, but he is not a good candidate for surgical repair per Dr. Excell Seltzer due to his co-morbid conditions including significant CHF Per Dr. Alford Highland extensive note from June of this year:  Assessment/Plan: 1. CAD: s/p CABG 1987.  4/17 cath showed all SVGs occluded.  The LIMA-LAD had a 70-75% anastomotic lesion.  Readmitted with anterior STEMI in 5/17.  Repeat cath showed similar anatomy to 4/17 cath.  Had PCI 01/20/16 to LIMA-LAD anastomotic lesion.  No further chest pain.  - Continue ASA 81 and Plavix.  - Continue Crestor and Zetia, good lipids recently.   - He can continue Ranexa.  2. Chronic systolic CHF: Ischemic cardiomyopathy.  EF 25% with mild-moderate RV dysfunction on 5/18 echo. Has St Jude ICD. He does not look volume overloaded on exam or by Corevue.  NYHA class III symptoms, prominent fatigue, suspect a degree of low cardiac output.  Symptoms worsened slowly over the last few months without weight gain.  May be related to severe aortic stenosis.  - Off coreg due to profound fatigue with beta blocker, improved off beta blocker. Would leave off beta blocker, especially with soft BP. - Continue digoxin 0.125 mg daily alternating with daily dose of 0.0625 mg daily. Check digoxin level today.  - Continue Bidil 1/2 tab tid and spironolactone 12.5 daily. No room to up-titrate with soft pressure.  - Continue Lasix 40 mg daily and check BMET today.   - Continue Galactic study drug.   3. PAD: Leg weakness/fatigue/pain is a major complaint.  Peripheral angiography by Dr Kirke Corin in 8/17, report above.  Plan medical management => no percutaneous option and very high risk for aortobifemoral bypass.  4. VT: Likely scar-mediated. Has a St Jude ICD.  - Continue amiodarone 200 mg daily.  Check TSH, LFTs today. Will need regular eye exams with amiodarone use.  5. Anemia: Fe-deficiency.  GI bleeding of uncertain etiology in 3/17.  Check CBC today.    6. Aortic stenosis:  Suspect low gradient severe aortic stenosis.  This likely is a significant contributor to his symptomatic worsening.  He would be a poor candidate for traditional surgical AVR.  TAVR, however, is also going to be a difficult proposition.  He has severe PAD and I am concerned that femoral access may not be feasible.  He also has CKD stage 3 and is frail.  Trans-apical or trans-aortic approach would likely be feasible but higher risk.  - I will have him see Dr. Excell Seltzer for a TAVR evaluation.  I told him that I am not sure that he will be a candidate. Will hold off on cardiac cath at this point given risk from contrast load.  If we decide on TAVR, however, will need.  7. Mitral regurgitation: Moderate, probably functional.  8. AAA: 4.0 cm 10/2016. Repeat 1 year.     I last saw him in May, at which time his main concern  was poor appetite and depression.  We changed his SSRI to paxil which he does think is helping somewhat.   Dr. Lowell Guitar is his nephrologist following his CRI  Wt Readings from Last 3 Encounters:  03/23/17 134 lb 6.4 oz (61 kg)  03/03/17 128 lb (58.1 kg)  02/09/17 127 lb (57.6 kg)   His appetite is "not good" even with paxil He does have some bilateral pedal edema which is not new but perhaps a bit worse lately He takes lasix 40 mg once a day, spiro 12.5 per day Swelling is worse in the evening- he sometimes even gets back to normal overnight, but his feet swell worse as the day goes on He does use 2 pillows to  prop up at night- this is not a change from his long term baseline  His most recent EF was 25%  He is able to walk about 100 yards before he gets SOB- he is riding in a WC today as the walk back to my exam room is long He has not noted any acute change in his SOB His weight is up a few lbs today as above- could be due to paxil vs fluid retention  BP Readings from Last 3 Encounters:  03/23/17 110/64  03/10/17 (!) 100/58  03/03/17 (!) 84/58   Pulse Readings from Last 3 Encounters:  03/23/17 81  03/10/17 75  03/03/17 75    Patient Active Problem List   Diagnosis Date Noted  . Anemia associated with stage 3 chronic renal failure 02/12/2017  . CKD (chronic kidney disease), stage III 12/26/2016  . Peripheral arterial disease (HCC) 07/22/2016  . Fatigue 01/27/2016  . ICD (implantable cardioverter-defibrillator) in place 01/27/2016  . ST elevation (STEMI) myocardial infarction involving left anterior descending coronary artery (HCC) 01/19/2016  . Chronic systolic CHF (congestive heart failure) (HCC) 01/13/2016  . Chest pain   . Moderate to severe aortic stenosis 12/16/2015  . Mitral regurgitation and aortic stenosis 12/16/2015  . Cardiomyopathy, ischemic 12/16/2015  . Essential hypertension   . Sustained VT (ventricular tachycardia) (HCC) 12/15/2015  . Cardiac arrest [facility]) secondary to sustained V tach requiring CPR 12/15/15 12/15/2015  . Coronary artery disease involving autologous vein bypass graft   . Coronary artery disease involving native heart with angina pectoris (HCC) 12/13/2015  . Protein-calorie malnutrition, moderate (HCC) 12/13/2015  . Elevated troponin 12/13/2015  . GI bleed   . Hypercholesteremia     Past Medical History:  Diagnosis Date  . Arthritis   . Blood transfusion without reported diagnosis   . CAD (coronary artery disease)   . Cardiac arrest [facility]) secondary to sustained V tach requiring CPR 12/15/15 12/15/2015   s/p St. Jude Ellipse VR (serial Number  [id number]) ICD  . Cataract   . CHF (congestive heart failure) (HCC)   . GI bleed   . Glaucoma   . Heart murmur   . Hypercholesteremia   . Hypertension   . MI (myocardial infarction) (HCC)   . Oxygen deficiency   . Sustained VT (ventricular tachycardia) (HCC) 12/15/2015    Past Surgical History:  Procedure Laterality Date  . CARDIAC CATHETERIZATION     CABG 1987  . CARDIAC CATHETERIZATION N/A 12/17/2015   Procedure: Right/Left Heart Cath and Coronary/Graft Angiography;  Surgeon: Lyn Records, MD;  Location: [facility] INVASIVE CV LAB;  Service: Cardiovascular;  Laterality: N/A;  . CARDIAC CATHETERIZATION N/A 01/18/2016   Procedure: Left Heart Cath and Coronary Angiography;  Surgeon: Runell Gess, MD;  Location: [facility] INVASIVE  CV LAB;  Service: Cardiovascular;  Laterality: N/A;  . CARDIAC CATHETERIZATION N/A 01/20/2016   Procedure: Coronary Stent Intervention;  Surgeon: Peter M Swaziland, MD;  Location: [facility] INVASIVE CV LAB;  Service: Cardiovascular;  Laterality: N/A;  . CARDIAC CATHETERIZATION N/A 02/12/2016   Procedure: Right Heart Cath;  Surgeon: Laurey Morale, MD;  Location: [facility] INVASIVE CV LAB;  Service: Cardiovascular;  Laterality: N/A;  . EP IMPLANTABLE DEVICE N/A 12/18/2015   Procedure: ICD Implant;  Surgeon: Will Jorja Loa, MD; [facility] VR (serial Number [id number]) ICD   . PERIPHERAL VASCULAR CATHETERIZATION N/A 04/14/2016   Procedure: Abdominal Aortogram w/Lower Extremity;  Surgeon: Iran Ouch, MD;  Location: [facility] INVASIVE CV LAB;  Service: Cardiovascular;  Laterality: N/A;  . ULTRASOUND GUIDANCE FOR VASCULAR ACCESS  01/20/2016   Procedure: Ultrasound Guidance For Vascular Access;  Surgeon: Peter M Swaziland, MD;  Location: [facility] INVASIVE CV LAB;  Service: Cardiovascular;;    Social History  Substance Use Topics  . Smoking status: Former Games developer  . Smokeless tobacco: Never Used  . Alcohol use No    Family History  Problem Relation Age of Onset  . Hypertension Mother   .  Hyperlipidemia Mother   . Heart attack Father   . Aneurysm Brother   . Heart disease Brother   . Hypertension Brother   . Heart disease Brother   . Hypertension Brother     No Known Allergies  Medication list has been reviewed and updated.  Current Outpatient Prescriptions on File Prior to Visit  Medication Sig Dispense Refill  . amiodarone (PACERONE) 200 MG tablet Take 1 tablet (200 mg total) by mouth daily. 30 tablet 6  . aspirin EC 81 MG EC tablet Take 1 tablet (81 mg total) by mouth daily. 30 tablet 3  . clopidogrel (PLAVIX) 75 MG tablet Take 1 tablet (75 mg total) by mouth daily. 30 tablet 6  . digoxin (LANOXIN) 0.125 MG tablet Take 0.5 tablets (0.0625 mg total) by mouth every other day.    . ezetimibe (ZETIA) 10 MG tablet take 1 tablet by mouth once daily 90 tablet 1  . ferrous sulfate 325 (65 FE) MG EC tablet Take 1 tablet (325 mg total) by mouth 3 (three) times daily with meals. (Patient taking differently: Take 325 mg by mouth 2 (two) times daily with a meal. ) 90 tablet 3  . folic acid (FOLVITE) 1 MG tablet take 1 tablet by mouth once daily 30 tablet 11  . Investigational - Study Medication Take 1 tablet by mouth 2 (two) times daily. Enrolled in Galactic Clinical Trial - Omecamtiv Mecarbil or Placebo    . isosorbide-hydrALAZINE (BIDIL) 20-37.5 MG tablet Take 0.5 tablets by mouth 3 (three) times daily. 45 tablet 3  . latanoprost (XALATAN) 0.005 % ophthalmic solution Place 1 drop into both eyes at bedtime.  0  . linaclotide (LINZESS) 145 MCG CAPS capsule Take 1 capsule (145 mcg total) by mouth daily as needed (for constipation). Reported on 01/01/2016 30 capsule 3  . nitroGLYCERIN (NITROSTAT) 0.4 MG SL tablet Place 0.4 mg under the tongue every 5 (five) minutes as needed for chest pain.    Marland Kitchen PARoxetine (PAXIL) 10 MG tablet Take 1 tablet (10 mg total) by mouth daily. 30 tablet 6  . promethazine (PHENERGAN) 12.5 MG tablet Take 1 tablet (12.5 mg total) by mouth every 6 (six) hours as  needed for nausea or vomiting. 30 tablet 0  . RANEXA 500 MG 12 hr tablet take 1 tablet by  mouth twice a day 60 tablet 5  . rosuvastatin (CRESTOR) 40 MG tablet Take 1 tablet (40 mg total) by mouth daily. 90 tablet 3  . spironolactone (ALDACTONE) 25 MG tablet take 1/2 tablet by mouth every morning 15 tablet 6  . timolol (TIMOPTIC) 0.5 % ophthalmic solution Place 1 drop into both eyes 2 (two) times daily.  0  . traMADol (ULTRAM) 50 MG tablet 1/2 tab po every 12 hours as needed for pain 20 tablet 0  . traZODone (DESYREL) 50 MG tablet Take 1/2 or 1 at bedtime as needed for sleep 90 tablet 3  . vitamin C (ASCORBIC ACID) 500 MG tablet Take 1 tablet (500 mg total) by mouth 2 (two) times daily. 60 tablet 3  . furosemide (LASIX) 40 MG tablet Take 1 tablet (40 mg total) by mouth daily. 90 tablet 3   No current facility-administered medications on file prior to visit.     Review of Systems:  As per HPI- otherwise negative.   Physical Examination: Vitals:   03/23/17 1022  BP: 110/64  Pulse: 81  Temp: 98.1 F (36.7 C)   Vitals:   03/23/17 1022  Weight: 134 lb 6.4 oz (61 kg)  Height: 5' 9.5" (1.765 m)   Body mass index is 19.56 kg/m. Ideal Body Weight: Weight in (lb) to have BMI = 25: 171.4  GEN: WDWN, NAD, Non-toxic, A & O x 3, thin, chronically ill appearing Here today with his son HEENT: Atraumatic, Normocephalic. Neck supple. No masses, No LAD. Ears and Nose: No external deformity. CV: RRR, loud AS murmur, no G/R. No JVD. No thrill. No extra heart sounds. PULM: CTA B, no wheezes, crackles, rhonchi. No retractions. No resp. distress. No accessory muscle use. ABD: S, NT, ND, +BS. No rebound. No HSM. EXTR: No c/c.  He has 1+ edema of both feet, but this does not extend up the legs.  The edematous areas are normally tender.  No evidence of cellulitis today NEURO seated in WC today due to weakness and SOB  PSYCH: Normally interactive. Conversant. Not depressed or anxious appearing.  Calm  demeanor.   Dg Chest 2 View  Result Date: 03/23/2017 CLINICAL DATA:  Shortness of breath this morning. History of CAD, post myocardial infarction and CABG. EXAM: CHEST  2 VIEW COMPARISON:  01/18/2016; 12/13/2015 FINDINGS: Grossly unchanged cardiac silhouette and mediastinal contours post median sternotomy. Stable positioning of support apparatus. The lungs appear hyperexpanded with flattening of the diaphragms. Interval development of small / trace pleural effusions and associated bibasilar opacities, left greater than right. Mild pulmonary is congestion without frank evidence of edema. There is minimal pleuroparenchymal thickening about the bilateral major and the right minor fissures. Minimal bilateral infrahilar opacities, left greater than right. No pneumothorax. No acute osseus abnormalities. IMPRESSION: Pulmonary venous congestion with development of small / trace pleural effusions and bibasilar opacities, left greater than right, atelectasis versus infiltrate. A follow-up chest radiograph in 3 to 4 weeks after treatment is recommended to ensure resolution. Electronically Signed   By: Simonne ComeJohn  Watts M.D.   On: 03/23/2017 11:17    Results for orders placed or performed in visit on 03/23/17  Basic metabolic panel  Result Value Ref Range   Sodium 137 135 - 145 mEq/L   Potassium 4.6 3.5 - 5.1 mEq/L   Chloride 102 96 - 112 mEq/L   CO2 23 19 - 32 mEq/L   Glucose, Bld 91 70 - 99 mg/dL   BUN 33 (H) 6 - 23 mg/dL  Creatinine, Ser 2.47 (H) 0.40 - 1.50 mg/dL   Calcium 9.3 8.4 - 04.510.5 mg/dL   GFR 40.9832.68 (L) >11.91>60.00 mL/min  CBC  Result Value Ref Range   WBC 7.7 4.0 - 10.5 K/uL   RBC 3.46 (L) 4.22 - 5.81 Mil/uL   Platelets 215.0 150.0 - 400.0 K/uL   Hemoglobin 10.8 (L) 13.0 - 17.0 g/dL   HCT 47.833.3 (L) 29.539.0 - 62.152.0 %   MCV 96.2 78.0 - 100.0 fl   MCHC 32.3 30.0 - 36.0 g/dL   RDW 30.819.5 (H) 65.711.5 - 84.615.5 %  B Nat Peptide  Result Value Ref Range   Pro B Natriuretic peptide (BNP) 2,817.0 (H) 0.0 - 100.0 pg/mL     Assessment and Plan: Chronic congestive heart failure, unspecified heart failure type (HCC) - Plan: B Nat Peptide, DG Chest 2 View  Weakness  Depression, reactive  Other fatigue  Decreased GFR  Pedal edema - Plan: B Nat Peptide, DG Chest 2 View  SOB (shortness of breath) - Plan: B Nat Peptide  Medication monitoring encounter - Plan: Basic metabolic panel  Anemia associated with stage 3 chronic renal failure - Plan: Basic metabolic panel, CBC  Delton Seeelson is here today with concern about foot swelling and SOB He was in good health despite history of CABG in 1987 until about a year ago when he had a GI bleed and recurrent MI.  Then in April of last year he became acutely ill,  was found to have acute systolic and diastolic HF, CAF, cardiomyopathy and pneumonia.  While admitted he became unresponsive with V tach and was resuscitated, now has an ICD.  His health has not returned to its former baseline since this episode  Called his daughter Toniann FailWendy to discuss his labs and plan of care-  He has gained a few lbs recently and his labs/ CXR suggest worsening of his CHF His BNP is near 3K- has of course been elevated in the past but not higher than 1500 Currently on lasix 40 daily, spiro 12.5 We need to balance his diuresis with his kidney impairment His CXR does suggest fluid in his lungs but no frank edema His creat up from most recent BMP but not significantly greater than baseline Will have pt take a 2nd dose of lasix today; he is seeing Dr. Shirlee LatchMcLean tomorrow who may wish to adjust further Lab Results  Component Value Date   CREATININE 2.47 (H) 03/23/2017   CREATININE 1.94 (H) 02/09/2017   CREATININE 2.56 (H) 12/23/2016   Chronic anemia is baseline  Toniann FailWendy also notes that her dad did better when he had home PT and a home aid- will re-order these services for him    Signed Abbe AmsterdamJessica Copland, MD

## 2017-03-23 ENCOUNTER — Ambulatory Visit (INDEPENDENT_AMBULATORY_CARE_PROVIDER_SITE_OTHER): Payer: Medicare Other | Admitting: Family Medicine

## 2017-03-23 ENCOUNTER — Other Ambulatory Visit (HOSPITAL_COMMUNITY): Payer: Self-pay | Admitting: Student

## 2017-03-23 ENCOUNTER — Ambulatory Visit (HOSPITAL_BASED_OUTPATIENT_CLINIC_OR_DEPARTMENT_OTHER)
Admission: RE | Admit: 2017-03-23 | Discharge: 2017-03-23 | Disposition: A | Discharge status: Home / Self Care | Payer: Medicare Other | Source: Ambulatory Visit | Attending: Family Medicine | Admitting: Family Medicine

## 2017-03-23 ENCOUNTER — Encounter: Payer: Self-pay | Admitting: Family Medicine

## 2017-03-23 VITALS — BP 110/64 | HR 81 | Temp 98.1°F | Ht 69.5 in | Wt 134.4 lb

## 2017-03-23 DIAGNOSIS — R0602 Shortness of breath: Secondary | ICD-10-CM

## 2017-03-23 DIAGNOSIS — D631 Anemia in chronic kidney disease: Secondary | ICD-10-CM

## 2017-03-23 DIAGNOSIS — F329 Major depressive disorder, single episode, unspecified: Secondary | ICD-10-CM | POA: Diagnosis not present

## 2017-03-23 DIAGNOSIS — J90 Pleural effusion, not elsewhere classified: Secondary | ICD-10-CM | POA: Diagnosis not present

## 2017-03-23 DIAGNOSIS — I509 Heart failure, unspecified: Secondary | ICD-10-CM | POA: Insufficient documentation

## 2017-03-23 DIAGNOSIS — N183 Chronic kidney disease, stage 3 unspecified: Secondary | ICD-10-CM

## 2017-03-23 DIAGNOSIS — I878 Other specified disorders of veins: Secondary | ICD-10-CM | POA: Diagnosis not present

## 2017-03-23 DIAGNOSIS — R944 Abnormal results of kidney function studies: Secondary | ICD-10-CM

## 2017-03-23 DIAGNOSIS — R531 Weakness: Secondary | ICD-10-CM

## 2017-03-23 DIAGNOSIS — Z5181 Encounter for therapeutic drug level monitoring: Secondary | ICD-10-CM

## 2017-03-23 DIAGNOSIS — R600 Localized edema: Secondary | ICD-10-CM | POA: Diagnosis not present

## 2017-03-23 DIAGNOSIS — R5381 Other malaise: Secondary | ICD-10-CM

## 2017-03-23 DIAGNOSIS — R5383 Other fatigue: Secondary | ICD-10-CM

## 2017-03-23 DIAGNOSIS — R918 Other nonspecific abnormal finding of lung field: Secondary | ICD-10-CM | POA: Diagnosis not present

## 2017-03-23 LAB — BASIC METABOLIC PANEL
BUN: 33 mg/dL — ABNORMAL HIGH (ref 6–23)
CHLORIDE: 102 meq/L (ref 96–112)
CO2: 23 mEq/L (ref 19–32)
Calcium: 9.3 mg/dL (ref 8.4–10.5)
Creatinine, Ser: 2.47 mg/dL — ABNORMAL HIGH (ref 0.40–1.50)
GFR: 32.68 mL/min — ABNORMAL LOW (ref >60.00)
Glucose, Bld: 91 mg/dL (ref 70–99)
POTASSIUM: 4.6 meq/L (ref 3.5–5.1)
Sodium: 137 mEq/L (ref 135–145)

## 2017-03-23 LAB — CBC
HEMATOCRIT: 33.3 % — AB (ref 39.0–52.0)
HEMOGLOBIN: 10.8 g/dL — AB (ref 13.0–17.0)
MCHC: 32.3 g/dL (ref 30.0–36.0)
MCV: 96.2 fl (ref 78.0–100.0)
Platelets: 215 10*3/uL (ref 150.0–400.0)
RBC: 3.46 Mil/uL — AB (ref 4.22–5.81)
RDW: 19.5 % — ABNORMAL HIGH (ref 11.5–15.5)
WBC: 7.7 10*3/uL (ref 4.0–10.5)

## 2017-03-23 LAB — BRAIN NATRIURETIC PEPTIDE: Pro B Natriuretic peptide (BNP): 2817 pg/mL — ABNORMAL HIGH (ref 0.0–100.0)

## 2017-03-23 NOTE — Patient Instructions (Signed)
It was a pleasure to see you today- I am sorry that your health has been so poor recently We are going to look for any sign of worsening heart failure/ fluid retention today with labs and a chest x-ray. I will be in touch with these results asap and we will plan our next step

## 2017-03-24 ENCOUNTER — Ambulatory Visit (HOSPITAL_COMMUNITY)
Admission: RE | Admit: 2017-03-24 | Discharge: 2017-03-24 | Disposition: A | Discharge status: Home / Self Care | Payer: Medicare Other | Source: Ambulatory Visit | Attending: Cardiology | Admitting: Cardiology

## 2017-03-24 ENCOUNTER — Encounter (HOSPITAL_COMMUNITY): Payer: Self-pay | Admitting: Cardiology

## 2017-03-24 VITALS — BP 104/62 | HR 81 | Wt 130.0 lb

## 2017-03-24 DIAGNOSIS — I2510 Atherosclerotic heart disease of native coronary artery without angina pectoris: Secondary | ICD-10-CM | POA: Diagnosis not present

## 2017-03-24 DIAGNOSIS — I350 Nonrheumatic aortic (valve) stenosis: Secondary | ICD-10-CM

## 2017-03-24 DIAGNOSIS — N184 Chronic kidney disease, stage 4 (severe): Secondary | ICD-10-CM | POA: Insufficient documentation

## 2017-03-24 DIAGNOSIS — D649 Anemia, unspecified: Secondary | ICD-10-CM | POA: Diagnosis not present

## 2017-03-24 DIAGNOSIS — Z95810 Presence of automatic (implantable) cardiac defibrillator: Secondary | ICD-10-CM | POA: Diagnosis not present

## 2017-03-24 DIAGNOSIS — Z7902 Long term (current) use of antithrombotics/antiplatelets: Secondary | ICD-10-CM | POA: Insufficient documentation

## 2017-03-24 DIAGNOSIS — E785 Hyperlipidemia, unspecified: Secondary | ICD-10-CM | POA: Insufficient documentation

## 2017-03-24 DIAGNOSIS — F329 Major depressive disorder, single episode, unspecified: Secondary | ICD-10-CM | POA: Diagnosis not present

## 2017-03-24 DIAGNOSIS — Z7982 Long term (current) use of aspirin: Secondary | ICD-10-CM | POA: Diagnosis not present

## 2017-03-24 DIAGNOSIS — I739 Peripheral vascular disease, unspecified: Secondary | ICD-10-CM | POA: Insufficient documentation

## 2017-03-24 DIAGNOSIS — Z951 Presence of aortocoronary bypass graft: Secondary | ICD-10-CM | POA: Insufficient documentation

## 2017-03-24 DIAGNOSIS — Z87891 Personal history of nicotine dependence: Secondary | ICD-10-CM | POA: Insufficient documentation

## 2017-03-24 DIAGNOSIS — I2582 Chronic total occlusion of coronary artery: Secondary | ICD-10-CM | POA: Insufficient documentation

## 2017-03-24 DIAGNOSIS — I252 Old myocardial infarction: Secondary | ICD-10-CM | POA: Diagnosis not present

## 2017-03-24 DIAGNOSIS — I340 Nonrheumatic mitral (valve) insufficiency: Secondary | ICD-10-CM | POA: Diagnosis not present

## 2017-03-24 DIAGNOSIS — I5022 Chronic systolic (congestive) heart failure: Secondary | ICD-10-CM

## 2017-03-24 DIAGNOSIS — Z8249 Family history of ischemic heart disease and other diseases of the circulatory system: Secondary | ICD-10-CM | POA: Diagnosis not present

## 2017-03-24 DIAGNOSIS — I255 Ischemic cardiomyopathy: Secondary | ICD-10-CM | POA: Diagnosis not present

## 2017-03-24 DIAGNOSIS — I714 Abdominal aortic aneurysm, without rupture: Secondary | ICD-10-CM | POA: Insufficient documentation

## 2017-03-24 DIAGNOSIS — Z006 Encounter for examination for normal comparison and control in clinical research program: Secondary | ICD-10-CM

## 2017-03-24 LAB — DIGOXIN LEVEL: DIGOXIN LVL: 0.8 ng/mL (ref 0.8–2.0)

## 2017-03-24 MED ORDER — FUROSEMIDE 40 MG PO TABS: ORAL_TABLET | ORAL | 6 refills | Status: DC

## 2017-03-24 NOTE — Progress Notes (Unsigned)
Patient present with his daughter Toniann FailWendy for [employer] HF Study Week 24 visit. Patient smiling today states he feels well today. States "I had a scheduled visit with my primary care doctor yesterday, my weight was up a few pounds, she doubled my lasix for yesterday and today my breathing is better." Verbalized good IP compliance. Denies any s/sx of ACS. Verbalizes s/sx of heart failure and ACS and when to call for help.

## 2017-03-24 NOTE — Patient Instructions (Addendum)
Will have Lasandra BeechJackie Brennan (Heart Failure Social Worker) and Optician, dispensingrika Nicolsen (Heart Failure Clinical Pharmacist) contact you to follow up on medication assistance. See cards for contact information.  Routine lab work today. Will notify you of abnormal results, otherwise no news is good news!  Will renew orders for home health and physical therapy.  INCREASE lasix to 40 mg (1 tab) in am and 20 mg (1/2 tab) in pm  Follow up next week with Dr. Alford HighlandMcLean's PA Otilio SaberAndy Tillery.  Take all medication as prescribed the day of your appointment. Bring all medications with you to your appointment.  Do the following things EVERYDAY: 1) Weigh yourself in the morning before breakfast. Write it down and keep it in a log. 2) Take your medicines as prescribed 3) Eat low salt foods-Limit salt (sodium) to 2000 mg per day.  4) Stay as active as you can everyday 5) Limit all fluids for the day to less than 2 liters

## 2017-03-25 ENCOUNTER — Other Ambulatory Visit (HOSPITAL_COMMUNITY): Payer: Self-pay

## 2017-03-25 DIAGNOSIS — I5022 Chronic systolic (congestive) heart failure: Secondary | ICD-10-CM

## 2017-03-26 NOTE — Progress Notes (Addendum)
Patient ID: Andre Wilson, male   DOB: 1936-06-28, 81 y.o.   MRN: [id number]   PCP: Dr. Arthor Wilson Cardiology: Dr. Shirlee Latch  81 yo with history of CAD s/p CABG, ischemic cardiomyopathy, aortic stenosis, PAD, and h/o GI bleed presents for cardiology followup.  Patient was followed in the past in [place].  He had CABG back in 1987.  He had an admission in [place] in 3/17 with GI bleeding.  Per patient and daughter, bleeding was severe but he did not have endoscopy.  He is back on ASA 81 and Plavix.    He was admitted to [facility] in 4/17 with acute on chronic systolic CHF.  After hospitalization, he had a VT arrest.  Echo showed EF 25-30% with moderately decreased RV function, probably moderate AS, and mod-severe MR.  LHC was done, showing 3/4 occluded bypass grafts.  LIMA-LAD was patent but had 70-75% anastomotic stenosis.  He was medically managed. RHC showed near-normal filling pressures.  St Jude ICD was placed.   Admitted 01/18/16 with anterior STEMI, TnI to 45.  Repeat LHC showed similar anatomy to 4/17 LHC with diffusely diseased RCA, totally occluded LCx and LAD, and 75% anastomotic lesion at LIMA touchdown. Touchdown lesion was likely the culprit. His last 2 admissions were likely ischemia-related, VT arrest in 4/17 and anterior STEMI in 5/17. He had successful PCI to the LIMA-LAD anastomotic lesion on 01/20/16. Discharge weight was 140 pounds.  RHC in 6/17 showed preserved cardiac output and normal filling pressures.   He had peripheral angiography in 8/17 showing occluded bilateral SFAs and significant bilateral CIA disease.  No good percutaneous options, and a poor candidate for aortobifemoral bypass.  He has been following with Dr. Kirke Wilson.   Recent echo (6/18) suggested low gradient severe aortic stenosis.  This was confirmed by low dose DSE.  He was seen by Dr. Excell Seltzer, not thought to be an LVAD candidate at this point.   Still very tenuous.  Not eating well but drinking 2  Ensures/day.  Not very mobile.  Walks with walker, short of breath walking around his house.  Has bilateral calf/thigh claudication walking around his house.  No chest pain.  No lightheadedness or falls.  Weight is up 3 lbs.  He felt especially bad yesterday, short of breath with any exertion.  PCP increased Lasix to 40 mg daily for a day.   Labs (4/17): K 4.5, creatinine 1.42, LDL 120, HCT 31.7 Labs (5/17): K 4.3, creatinine 1.49, BNP 645, TSH normal, HCT 38.8, LFTs normal Labs (01/27/2016):  K 5.2, Creatinine 1.68, BNP 688, Hgb 10.9  Labs (8/17): K 5.1, creatinine 1.4, hgb 10.6 Labs (12/17): LDL 77, HDL 42 Labs (2/18): K 137, K 5.3, creatinine 1.89, LFTs normal, hgb 10.1 Labs (5/18): K 4, creatinine 2.5 => 2.56, hgb 9.8 Labs (6/18): LDL 82, digoxin 1.7, AST 44, ALT 61, TSH normal Labs (8/18): BNP 2817, hgb 10.8, K 4.6, creatinine 1.94 => 2.47  PMH: 1. CAD: S/p CABG 1987.  - LHC (4/17) with total occlusion of vein grafts x 3.  LIMA-LAD patent with 70-75% anastomotic stenosis.  Total occlusion of the LAD, LCx, and diffuse disease throughout the RCA.  He was managed medically.  - Admission 5/17 with anterior STEMI, troponin to 45.  LHC in 5/17 showed similar anatomy to 4/17 with diffusely diseased RCA, totally occluded LCx and LAD, and 75% anastomotic lesion at LIMA touchdown. Touchdown lesion was likely the culprit. He had successful PCI to the LIMA-LAD anastomotic lesion on 01/20/16.  2. Chronic systolic CHF: Ischemic cardiomyopathy.  St Jude ICD.  - Echo (4/17): EF 25-30%, inferolateral akinesis, moderately decreased RV systolic function, ?moderate aortic stenosis but mean gradient 10 mmHg, moderate to severe MR.  - RHC (4/17): mean RA 2, PA 42/14, mean PCWP 15, CI 2.09. - Echo (5/17): EF 25-30%, anteroseptal AK, mild AS, mild AI, moderate MR, PASP 38 mmHg.  - RHC (6/17): mean RA 1, PA 41/14 mean 24, mean PCWP 10, CI 2.33, PVR 3.3 WU.  - Echo (5/18): EF 25%, mild LV dilation, mild RV dilation  with mild-moderately decreased RV systolic function, moderate TR, moderate MR (likely functional), cannot rule out low gradient severe AS with AVA 0.63 cm^2 and mean gradient 23 mmHg.  3. History of GI bleeding: 3/17.  Per report, bleeding was severe, but he did not have endoscopy done.   4. PAD: 11/16 cath showed occluded bilateral SFAs.   - Lower extremity dopplers (5/17) with bilateral SFA occlusions, > 50% left external iliac stenosis.  - PV angiography in 8/17 with totally occluded bilateral SFAs, significant bilateral CIA disease => medical treatment.  5. VT: 4/17 admit with VT, loss of consciousness.  6. Hyperlipidemia 7. Aortic stenosis: Probably moderate on 4/17 echo, mild on 5/17 echo.  Concern for low gradient severe AS on 5/18 echo.  - Low dose DSE (6/18): AVA remained 0.5 cm^2 with dobutamine, mean gradient increased to 32 mmHg. Suspect low gradient severe AS.  8. Mitral regurgitation: Moderate to severe on 4/17 echo, suspect infarct-related.  Moderate on 5/17 echo.  Moderate functional MR on 5/18 echo.  9. AAA: Abdominal US 5/17 with 4.5 cm AAA.  Abdominal US (3/18) with 4 cm AAA.  10. Low back pain 11. Depression 12. Fe deficiency anemia.   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  Nonsmoker (since college years), no ETOH.   FH: CAD  Review of systems complete and found to be negative unless listed in HPI.    Current Outpatient Prescriptions  Medication Sig Dispense Refill  . amiodarone (PACERONE) 200 MG tablet Take 1 tablet (200 mg total) by mouth daily. 30 tablet 6  . aspirin EC 81 MG EC tablet Take 1 tablet (81 mg total) by mouth daily. 30 tablet 3  . clopidogrel (PLAVIX) 75 MG tablet Take 1 tablet (75 mg total) by mouth daily. 30 tablet 6  . digoxin (LANOXIN) 0.125 MG tablet Take 0.5 tablets (0.0625 mg total) by mouth every other day.    . ezetimibe (ZETIA) 10 MG tablet take 1 tablet by mouth once daily 90 tablet 1  . ferrous sulfate  325 (65 FE) MG EC tablet Take 1 tablet (325 mg total) by mouth 3 (three) times daily with meals. (Patient taking differently: Take 325 mg by mouth 2 (two) times daily with a meal. ) 90 tablet 3  . folic acid (FOLVITE) 1 MG tablet take 1 tablet by mouth once daily 30 tablet 11  . Investigational - Study Medication Take 1 tablet by mouth 2 (two) times daily. Enrolled in [employer] Clinical Trial - Omecamtiv Mecarbil or Placebo    . isosorbide-hydrALAZINE (BIDIL) 20-37.5 MG tablet Take 0.5 tablets by mouth 3 (three) times daily. 45 tablet 3  . latanoprost (XALATAN) 0.005 % ophthalmic solution Place 1 drop into both eyes at bedtime.  0  . linaclotide (LINZESS) 145 MCG CAPS capsule Take 1 capsule (145 mcg total) by mouth daily as needed (for constipation). Reported on 01/01/2016 30 capsule 3  . nitroGLYCERIN (  NITROSTAT) 0.4 MG SL tablet Place 0.4 mg under the tongue every 5 (five) minutes as needed for chest pain.    Marland Kitchen. PARoxetine (PAXIL) 10 MG tablet Take 1 tablet (10 mg total) by mouth daily. 30 tablet 6  . promethazine (PHENERGAN) 12.5 MG tablet Take 1 tablet (12.5 mg total) by mouth every 6 (six) hours as needed for nausea or vomiting. 30 tablet 0  . RANEXA 500 MG 12 hr tablet take 1 tablet by mouth twice a day 60 tablet 5  . rosuvastatin (CRESTOR) 40 MG tablet Take 1 tablet (40 mg total) by mouth daily. 90 tablet 3  . spironolactone (ALDACTONE) 25 MG tablet take 1/2 tablet by mouth every morning 15 tablet 6  . timolol (TIMOPTIC) 0.5 % ophthalmic solution Place 1 drop into both eyes 2 (two) times daily.  0  . traMADol (ULTRAM) 50 MG tablet 1/2 tab po every 12 hours as needed for pain 20 tablet 0  . traZODone (DESYREL) 50 MG tablet Take 1/2 or 1 at bedtime as needed for sleep 90 tablet 3  . vitamin C (ASCORBIC ACID) 500 MG tablet Take 1 tablet (500 mg total) by mouth 2 (two) times daily. 60 tablet 3  . furosemide (LASIX) 40 MG tablet Take 40 mg (1 tab) in am and 20 mg (1/2 tab) in pm 45 tablet 6   No  current facility-administered medications for this encounter.    BP 104/62 (BP Location: Right Arm, Patient Position: Sitting, Cuff Size: Normal)   Pulse 81   Wt 130 lb (59 kg)   SpO2 98%   BMI 18.92 kg/m    Wt Readings from Last 3 Encounters:  03/24/17 130 lb (59 kg)  03/24/17 130 lb (59 kg)  03/23/17 134 lb 6.4 oz (61 kg)  General: Thin Neck: JVP 12-14 cm, no thyromegaly or thyroid nodule.  Lungs: Clear to auscultation bilaterally with normal respiratory effort. CV: Nondisplaced PMI.  Heart regular S1/S2, no S3/S4, 3/6 SEM RUSB with muffled S2.  1+ edema to knees bilaterally.  No carotid bruit.  Normal pedal pulses.  Abdomen: Soft, nontender, no hepatosplenomegaly, no distention.  Skin: Intact without lesions or rashes.  Neurologic: Alert and oriented x 3.  Psych: Normal affect. Extremities: No clubbing or cyanosis.  HEENT: Normal.   Assessment/Plan: 1. CAD: s/p CABG 1987.  4/17 cath showed all SVGs occluded.  The LIMA-LAD had a 70-75% anastomotic lesion.  Readmitted with anterior STEMI in 5/17.  Repeat cath showed similar anatomy to 4/17 cath.  Had PCI 01/20/16 to LIMA-LAD anastomotic lesion.  No further chest pain.  - Continue ASA 81 and Plavix.  - Continue Crestor and Zetia, good lipids recently.   - He can continue Ranexa.  2. Chronic systolic CHF: Ischemic cardiomyopathy.  EF 25% with mild-moderate RV dysfunction on 5/18 echo. Has St Jude ICD.  He is volume overloaded on exam today, NYHA class IIIb, suspect a degree of low cardiac output.  Symptoms have worsened slowly over the last few months.  May be related to severe aortic stenosis.  - Off coreg due to profound fatigue with beta blocker, improved off beta blocker. Would leave off beta blocker, especially with soft BP. - Digoxin dose decreased recently to 0.0625 qod with elevated level.  Recheck level today.  - Continue Bidil 1/2 tab tid and spironolactone 12.5 daily. No room to up-titrate with soft pressure.  - With volume  overload, increase Lasix to 40 mg qam/20 qpm. BMET/BNP next week.    -  Continue [employer] study drug.  3. PAD: Ongoing significant claudication, no pedal ulcerations.  Peripheral angiography by Dr Andre CorinArida in 8/17, report above.  Plan medical management => no percutaneous option and very high risk for aortobifemoral bypass.  4. VT: Likely scar-mediated. Has a St Jude ICD.  - Continue amiodarone 200 mg daily.  Recent LFTs and TSH ok. Will need regular eye exams with amiodarone use.  5. Anemia: Fe-deficiency.  GI bleeding of uncertain etiology in 3/17. 6. Aortic stenosis:  Suspect low gradient severe aortic stenosis.  This likely is a significant contributor to his symptomatic worsening.  He would be a poor candidate for traditional surgical AVR.  TAVR, however, is also going to be a difficult proposition.  He has severe PAD and I am concerned that femoral access may not be feasible.  He also has CKD stage 3 and is frail.  Trans-apical or trans-aortic approach would likely be technically feasible but higher risk.  - He saw Dr. Excell Seltzerooper who did not think TAVR was feasible.  He was willing to reassess in a couple of months to see if Mr Zollie BeckersHendon has strengthened any.  7. Mitral regurgitation: Moderate, probably functional.  8. AAA: 4.0 cm 10/2016. Repeat 1 year.    9. CKD: Stage III-IV.  Suspect cardiorenal component.  Needs BMET in 1 week after increasing Lasix.  10. I will try to renew PT for him, he is very deconditioned.  11. Poor long-term prognosis.  Family did not want to discuss at this time.   Followup in 1 week.    Marca Anconaalton Angelissa Supan, MD  03/26/2017

## 2017-03-27 ENCOUNTER — Encounter: Payer: Self-pay | Admitting: Family Medicine

## 2017-03-27 NOTE — Addendum Note (Signed)
Encounter addended by: Laurey MoraleMcLean, Peyton Rossner S, MD on: 03/27/2017 12:09 AM<BR>    Actions taken: Sign clinical note

## 2017-03-28 ENCOUNTER — Telehealth (HOSPITAL_COMMUNITY): Payer: Self-pay | Admitting: *Deleted

## 2017-03-28 NOTE — Telephone Encounter (Signed)
-----   Message from [place] NewcomerMelissa Stenson sent at 03/25/2017  4:44 PM EDT ----- [place] SinkHey Megan. We are looking at a start of care date for next Tuesday.  Is that okay? ----- Message ----- From: Chyrl CivatteBradley, Megan G, RN Sent: 03/25/2017  11:03 AM To: Sheron NightingaleJason Pierce, [place] NewcomerMelissa Stenson  [facility] epic  Happy Friday!  Ave FilterBradley, Megan Genevea   RN, BSN [facility] [place] Phone: [phone] Fax: [phone] [email or web address]

## 2017-03-28 NOTE — Telephone Encounter (Signed)
Melissa called Triage line stating her manager signed off for start of care to start tomorrow.  Called her back and said that would be fine. No further questions.

## 2017-03-29 DIAGNOSIS — I5022 Chronic systolic (congestive) heart failure: Secondary | ICD-10-CM | POA: Diagnosis not present

## 2017-03-29 DIAGNOSIS — I130 Hypertensive heart and chronic kidney disease with heart failure and stage 1 through stage 4 chronic kidney disease, or unspecified chronic kidney disease: Secondary | ICD-10-CM | POA: Diagnosis not present

## 2017-03-29 DIAGNOSIS — I25119 Atherosclerotic heart disease of native coronary artery with unspecified angina pectoris: Secondary | ICD-10-CM | POA: Diagnosis not present

## 2017-03-29 DIAGNOSIS — N183 Chronic kidney disease, stage 3 (moderate): Secondary | ICD-10-CM | POA: Diagnosis not present

## 2017-03-31 ENCOUNTER — Telehealth: Payer: Self-pay | Admitting: Family Medicine

## 2017-03-31 ENCOUNTER — Ambulatory Visit: Payer: Self-pay | Admitting: Medical

## 2017-03-31 ENCOUNTER — Telehealth: Payer: Self-pay | Admitting: Medical

## 2017-03-31 NOTE — Telephone Encounter (Signed)
Patient Name: Andre Wilson  DOB: Feb 13, 1936    Initial Comment Caller states father seen last week, this week can't keep anything down, vomiting after one bite, able to drink ensure with medications. She is wondering what to do or what this could be.    Nurse Assessment  Nurse: Andre GaulStringer, RN, Andre Wilson Date/Time (Eastern Time): 03/31/2017 9:40:50 AM  Confirm and document reason for call. If symptomatic, describe symptoms. ---Caller states father was seen last week for a check up. When he takes one bite, he vomits up a lot of mucus. He is drinking ensure and he is able to take his medication. His appetite has decreased. Has medication prescribed to help with his appetite. His Lasix was recently increased to 60 mg qd. He has been urinating. No pain. No sweating. Had pain before this week. Vomited x 1 on Monday and Tuesday. Vomited x 2 on Wednesday. Very weak. No fever.  Does the patient have any new or worsening symptoms? ---Yes  Will a triage be completed? ---Yes  Related visit to physician within the last 2 weeks? ---No  Does the PT have any chronic conditions? (i.e. diabetes, asthma, etc.) ---Yes  List chronic conditions. ---heart problems  Is this a behavioral health or substance abuse call? ---No     Guidelines    Guideline Title Affirmed Question Affirmed Notes  Vomiting [1] MILD or MODERATE vomiting AND [2] present > 48 hours (2 days) (Exception: mild vomiting with associated diarrhea)    Final Disposition User   See Physician within 24 Hours [place] LakeStringer, RN, Vera    Comments  appt scheduled for 03/31/2017 at 5:15 pm with Whole FoodsEdward Wilson.   Referrals  REFERRED TO PCP OFFICE   Disagree/Comply: Comply

## 2017-03-31 NOTE — Telephone Encounter (Signed)
Pt has very complicated history.  With his kidney condition, vomiting and other conditions(see medical history list). He would probably be best served with ED evaluation. Stat labs to assess electrlolytes, iv fluids and maybe other test. Depends what is going on clinically. Being Friday he would need stat labs. As we approach weekend I feel it would better to go to ED. Sound like at the least he is dehydrate and would benefit from IV fluids. In addition his age is a concern as well.

## 2017-03-31 NOTE — Telephone Encounter (Signed)
Appt noted.  Message routed to [facility] for AmberFYI.

## 2017-03-31 NOTE — Telephone Encounter (Signed)
Pt's daughter called in to be advised. She said that pt recently seen pt. She is concerned because pt is unable to keep food down. She said that he has been able to keep down his medication and ensure. She would like to be advised by PCP.  Forwarded call to team health for triaging in pcp's absence.

## 2017-04-01 ENCOUNTER — Encounter (HOSPITAL_COMMUNITY): Payer: Self-pay

## 2017-04-01 ENCOUNTER — Other Ambulatory Visit (HOSPITAL_COMMUNITY): Payer: Self-pay | Admitting: *Deleted

## 2017-04-01 ENCOUNTER — Telehealth (HOSPITAL_COMMUNITY): Payer: Self-pay | Admitting: Cardiology

## 2017-04-01 ENCOUNTER — Telehealth: Payer: Self-pay | Admitting: *Deleted

## 2017-04-01 ENCOUNTER — Ambulatory Visit (HOSPITAL_COMMUNITY)
Admission: RE | Admit: 2017-04-01 | Discharge: 2017-04-01 | Disposition: A | Discharge status: Home / Self Care | Payer: Medicare Other | Source: Ambulatory Visit | Attending: Internal Medicine | Admitting: Internal Medicine

## 2017-04-01 VITALS — BP 114/64 | HR 79 | Wt 130.0 lb

## 2017-04-01 DIAGNOSIS — R29898 Other symptoms and signs involving the musculoskeletal system: Secondary | ICD-10-CM | POA: Diagnosis not present

## 2017-04-01 DIAGNOSIS — M545 Low back pain: Secondary | ICD-10-CM | POA: Diagnosis not present

## 2017-04-01 DIAGNOSIS — I25810 Atherosclerosis of coronary artery bypass graft(s) without angina pectoris: Secondary | ICD-10-CM

## 2017-04-01 DIAGNOSIS — Z79899 Other long term (current) drug therapy: Secondary | ICD-10-CM | POA: Insufficient documentation

## 2017-04-01 DIAGNOSIS — Z7982 Long term (current) use of aspirin: Secondary | ICD-10-CM | POA: Diagnosis not present

## 2017-04-01 DIAGNOSIS — Z951 Presence of aortocoronary bypass graft: Secondary | ICD-10-CM | POA: Insufficient documentation

## 2017-04-01 DIAGNOSIS — I5022 Chronic systolic (congestive) heart failure: Secondary | ICD-10-CM | POA: Diagnosis not present

## 2017-04-01 DIAGNOSIS — E785 Hyperlipidemia, unspecified: Secondary | ICD-10-CM | POA: Diagnosis not present

## 2017-04-01 DIAGNOSIS — I10 Essential (primary) hypertension: Secondary | ICD-10-CM | POA: Diagnosis not present

## 2017-04-01 DIAGNOSIS — N184 Chronic kidney disease, stage 4 (severe): Secondary | ICD-10-CM | POA: Insufficient documentation

## 2017-04-01 DIAGNOSIS — I255 Ischemic cardiomyopathy: Secondary | ICD-10-CM | POA: Insufficient documentation

## 2017-04-01 DIAGNOSIS — I350 Nonrheumatic aortic (valve) stenosis: Secondary | ICD-10-CM | POA: Diagnosis not present

## 2017-04-01 DIAGNOSIS — F329 Major depressive disorder, single episode, unspecified: Secondary | ICD-10-CM | POA: Insufficient documentation

## 2017-04-01 DIAGNOSIS — I340 Nonrheumatic mitral (valve) insufficiency: Secondary | ICD-10-CM | POA: Diagnosis not present

## 2017-04-01 DIAGNOSIS — D649 Anemia, unspecified: Secondary | ICD-10-CM | POA: Diagnosis not present

## 2017-04-01 DIAGNOSIS — I714 Abdominal aortic aneurysm, without rupture: Secondary | ICD-10-CM | POA: Insufficient documentation

## 2017-04-01 DIAGNOSIS — Z5189 Encounter for other specified aftercare: Secondary | ICD-10-CM | POA: Diagnosis present

## 2017-04-01 DIAGNOSIS — I2510 Atherosclerotic heart disease of native coronary artery without angina pectoris: Secondary | ICD-10-CM | POA: Insufficient documentation

## 2017-04-01 DIAGNOSIS — I509 Heart failure, unspecified: Secondary | ICD-10-CM

## 2017-04-01 LAB — CUP PACEART REMOTE DEVICE CHECK
Battery Remaining Percentage: 88 %
Battery Voltage: 3.01 V
Date Time Interrogation Session: 20180712072157
HIGH POWER IMPEDANCE MEASURED VALUE: 59 Ohm
HighPow Impedance: 59 Ohm
Implantable Lead Location: 753860
Lead Channel Pacing Threshold Pulse Width: 0.4 ms
Lead Channel Sensing Intrinsic Amplitude: 11.7 mV
Lead Channel Setting Pacing Amplitude: 2.5 V
MDC IDC LEAD IMPLANT DT: 20170427
MDC IDC MSMT BATTERY REMAINING LONGEVITY: 91 mo
MDC IDC MSMT LEADCHNL RV IMPEDANCE VALUE: 410 Ohm
MDC IDC MSMT LEADCHNL RV PACING THRESHOLD AMPLITUDE: 0.75 V
MDC IDC PG IMPLANT DT: 20170427
MDC IDC SET LEADCHNL RV PACING PULSEWIDTH: 0.4 ms
MDC IDC SET LEADCHNL RV SENSING SENSITIVITY: 0.5 mV
MDC IDC STAT BRADY RV PERCENT PACED: 1 %
Pulse Gen Serial Number: 7338650

## 2017-04-01 LAB — BASIC METABOLIC PANEL
Anion gap: 11 (ref 5–15)
BUN: 40 mg/dL — AB (ref 6–20)
CALCIUM: 9.4 mg/dL (ref 8.9–10.3)
CHLORIDE: 104 mmol/L (ref 101–111)
CO2: 23 mmol/L (ref 22–32)
CREATININE: 3.16 mg/dL — AB (ref 0.61–1.24)
GFR calc Af Amer: 20 mL/min — ABNORMAL LOW (ref >60)
GFR calc non Af Amer: 17 mL/min — ABNORMAL LOW (ref >60)
GLUCOSE: 98 mg/dL (ref 65–99)
Potassium: 5.2 mmol/L — ABNORMAL HIGH (ref 3.5–5.1)
Sodium: 138 mmol/L (ref 135–145)

## 2017-04-01 LAB — CBC
HCT: 34.4 % — ABNORMAL LOW (ref 39.0–52.0)
Hemoglobin: 11.4 g/dL — ABNORMAL LOW (ref 13.0–17.0)
MCH: 29.8 pg (ref 26.0–34.0)
MCHC: 33.1 g/dL (ref 30.0–36.0)
MCV: 89.8 fL (ref 78.0–100.0)
PLATELETS: 217 10*3/uL (ref 150–400)
RBC: 3.83 MIL/uL — AB (ref 4.22–5.81)
RDW: 16.9 % — AB (ref 11.5–15.5)
WBC: 8.1 10*3/uL (ref 4.0–10.5)

## 2017-04-01 LAB — DIGOXIN LEVEL: DIGOXIN LVL: 0.9 ng/mL (ref 0.8–2.0)

## 2017-04-01 LAB — PROTIME-INR
INR: 1.24
Prothrombin Time: 15.6 seconds — ABNORMAL HIGH (ref 11.4–15.2)

## 2017-04-01 LAB — BRAIN NATRIURETIC PEPTIDE: B NATRIURETIC PEPTIDE 5: 3581.8 pg/mL — AB (ref 0.0–100.0)

## 2017-04-01 MED ORDER — NITROGLYCERIN 0.4 MG SL SUBL: 0.4000 mg | SUBLINGUAL_TABLET | SUBLINGUAL | 6 refills | Status: AC | PRN

## 2017-04-01 MED ORDER — PROMETHAZINE HCL 12.5 MG PO TABS
12.5000 mg | ORAL_TABLET | Freq: Four times a day (QID) | ORAL | 6 refills | Status: AC | PRN
Start: 2017-04-01 — End: ?

## 2017-04-01 MED ORDER — FUROSEMIDE 40 MG PO TABS
ORAL_TABLET | ORAL | 6 refills | Status: AC
Start: 2017-04-01 — End: ?

## 2017-04-01 MED ORDER — PROMETHAZINE HCL 12.5 MG PO TABS: 12.5000 mg | ORAL_TABLET | Freq: Four times a day (QID) | ORAL | 6 refills | Status: DC | PRN

## 2017-04-01 NOTE — Telephone Encounter (Signed)
-----   Message from Will Jorja LoaMartin Camnitz, MD sent at 04/01/2017  3:40 PM EDT ----- Abnormal device interrogation reviewed.  Lead parameters and battery status stable.  Fluid accumulation. If fluid symptoms, take 40 mg lasix BID for 2 days.

## 2017-04-01 NOTE — Telephone Encounter (Deleted)
-----   Message from Will Martin Camnitz, MD sent at 04/01/2017  3:40 PM EDT ----- Abnormal device interrogation reviewed.  Lead parameters and battery status stable.  Fluid accumulation. If fluid symptoms, take 40 mg lasix BID for 2 days. 

## 2017-04-01 NOTE — Telephone Encounter (Signed)
LMTCB//sss 

## 2017-04-01 NOTE — Telephone Encounter (Signed)
Patient aware. PATIENT AWARE VIA DAUGHTER, REPEAT LABS 8/15

## 2017-04-01 NOTE — Progress Notes (Signed)
Advanced Heart Failure Medication Review by a Pharmacist  Does the patient  feel that his/her medications are working for him/her?  yes  Has the patient been experiencing any side effects to the medications prescribed?  no  Does the patient measure his/her own blood pressure or blood glucose at home?  yes   Does the patient have any problems obtaining medications due to transportation or finances?   no  Understanding of regimen: good Understanding of indications: good Potential of compliance: good Patient understands to avoid NSAIDs. Patient understands to avoid decongestants.  Issues to address at subsequent visits: None   Pharmacist comments: Mr. Andre Wilson is a pleasant 81 yo M presenting with his daughter and without a medication list but with fair understanding of his regimen. He reports good compliance with his regimen and his only concern today is with his increased nausea/vomiting. He did not have any other medication-related questions or concerns for me at this time.   Andre Wilson, PharmD, BCPS, CPP Clinical Pharmacist Pager: [phone] Phone: [phone] 04/01/2017 9:58 AM      Time with patient: 10 minutes Preparation and documentation time: 2 minutes Total time: 12 minutes

## 2017-04-01 NOTE — Addendum Note (Signed)
Encounter addended by: Graciella Freerillery, Michael Andrew, PA-C on: 04/01/2017 12:45 PM<BR>    Actions taken: Sign clinical note

## 2017-04-01 NOTE — Telephone Encounter (Signed)
-----   Message from Graciella FreerMichael Andrew Tillery, PA-C sent at 04/01/2017 12:29 PM EDT ----- BNP markedly elevated in setting of suspected low output.   Creatinine elevated from last week. K elevated. STOP Cleda DaubSpiro. Recheck BMET next week.  STOP digoxin.  Digoxin level elevated, and he did not take this morning.   Will also forward to Dr. Shirlee LatchMcLean to make sure he agrees.   Casimiro NeedleMichael [address]Andy" Edgarillery, PA-C 04/01/2017 12:28 PM

## 2017-04-01 NOTE — Patient Instructions (Signed)
Routine lab work today. Will notify you of abnormal results, otherwise no news is good news!  No changes to medication at this time.  Refills sent to pharmacy. Lasix Nitroglycerin Phenergan  Follow up 2 weeks with Otilio SaberAndy Tillery PA-C.  Take all medication as prescribed the day of your appointment. Bring all medications with you to your appointment.  Do the following things EVERYDAY: 1) Weigh yourself in the morning before breakfast. Write it down and keep it in a log. 2) Take your medicines as prescribed 3) Eat low salt foods-Limit salt (sodium) to 2000 mg per day.  4) Stay as active as you can everyday 5) Limit all fluids for the day to less than 2 liters

## 2017-04-01 NOTE — Telephone Encounter (Signed)
Left detailed voicemail for patient to be seen in the ED. Called phone # provided 2x.    PC

## 2017-04-01 NOTE — Progress Notes (Addendum)
Patient ID: Andre Wilson, male   DOB: 1936/05/15, 81 y.o.   MRN: [id number]   PCP: Dr. Arthor CaptainJ. Copland Cardiology: Dr. Shirlee LatchMcLean  81 yo with history of CAD s/p CABG, ischemic cardiomyopathy, aortic stenosis, PAD, and h/o GI bleed presents for cardiology followup.  [place] the past in [place].  He had CABG back in 1987.  He had an admission in [place] in 3/17 with GI bleeding.  Per patient and daughter, bleeding was severe but he did not have endoscopy.  He is back on ASA 81 and Plavix.    He was admitted to [facility] in 4/17 with acute on chronic systolic CHF.  After hospitalization, he had a VT arrest.  Echo showed EF 25-30% with moderately decreased RV function, probably moderate AS, and mod-severe MR.  LHC was done, showing 3/4 occluded bypass grafts.  LIMA-LAD was patent but had 70-75% anastomotic stenosis.  He was medically managed. RHC showed near-normal filling pressures.  St Jude ICD was placed.   Admitted 01/18/16 with anterior STEMI, TnI to 45.  Repeat LHC showed similar anatomy to 4/17 LHC with diffusely diseased RCA, totally occluded LCx and LAD, and 75% anastomotic lesion at LIMA touchdown. Touchdown lesion was likely the culprit. His last 2 admissions were likely ischemia-related, VT arrest in 4/17 and anterior STEMI in 5/17. He had successful PCI to the LIMA-LAD anastomotic lesion on 01/20/16. Discharge weight was 140 pounds.  RHC in 6/17 showed preserved cardiac output and normal filling pressures.   He had peripheral angiography in 8/17 showing occluded bilateral SFAs and significant bilateral CIA disease.  No good percutaneous options, and a poor candidate for aortobifemoral bypass.  He has been following with Dr. Kirke CorinArida.   Recent echo (6/18) suggested low gradient severe aortic stenosis.  This was confirmed by low dose DSE.  He was seen by Dr. Excell Seltzerooper, not thought to be an LVAD candidate at this point.   Pt presents today for 1 week follow up. Last week lasix  increased with volume overloaded and worsening symptoms.  Appetite remains poor. Attempting to drink 2 Ensures/day. SOB walking with house and with changing clothes. Weight stable from last week. He thinks his breathing and swelling are "slightly better". Denies chest pain, lightheadedness or falls.  Corevue: Thoracic impedence close to threhold, but with mild downtrend (towards volume overload). No AT/AF alert. No VT/VF   Labs (4/17): K 4.5, creatinine 1.42, LDL 120, HCT 31.7 Labs (5/17): K 4.3, creatinine 1.49, BNP 645, TSH normal, HCT 38.8, LFTs normal Labs (01/27/2016):  K 5.2, Creatinine 1.68, BNP 688, Hgb 10.9  Labs (8/17): K 5.1, creatinine 1.4, hgb 10.6 Labs (12/17): LDL 77, HDL 42 Labs (2/18): K 137, K 5.3, creatinine 1.89, LFTs normal, hgb 10.1 Labs (5/18): K 4, creatinine 2.5 => 2.56, hgb 9.8 Labs (6/18): LDL 82, digoxin 1.7, AST 44, ALT 61, TSH normal Labs (8/18): BNP 2817, hgb 10.8, K 4.6, creatinine 1.94 => 2.47  PMH: 1. CAD: S/p CABG 1987.  - LHC (4/17) with total occlusion of vein grafts x 3.  LIMA-LAD patent with 70-75% anastomotic stenosis.  Total occlusion of the LAD, LCx, and diffuse disease throughout the RCA.  He was managed medically.  - Admission 5/17 with anterior STEMI, troponin to 45.  LHC in 5/17 showed similar anatomy to 4/17 with diffusely diseased RCA, totally occluded LCx and LAD, and 75% anastomotic lesion at LIMA touchdown. Touchdown lesion was likely the culprit. He had successful PCI to the LIMA-LAD anastomotic lesion on  01/20/16. 2. Chronic systolic CHF: Ischemic cardiomyopathy.  St Jude ICD.  - Echo (4/17): EF 25-30%, inferolateral akinesis, moderately decreased RV systolic function, ?moderate aortic stenosis but mean gradient 10 mmHg, moderate to severe MR.  - RHC (4/17): mean RA 2, PA 42/14, mean PCWP 15, CI 2.09. - Echo (5/17): EF 25-30%, anteroseptal AK, mild AS, mild AI, moderate MR, PASP 38 mmHg.  - RHC (6/17): mean RA 1, PA 41/14 mean 24, mean PCWP  10, CI 2.33, PVR 3.3 WU.  - Echo (5/18): EF 25%, mild LV dilation, mild RV dilation with mild-moderately decreased RV systolic function, moderate TR, moderate MR (likely functional), cannot rule out low gradient severe AS with AVA 0.63 cm^2 and mean gradient 23 mmHg.  3. History of GI bleeding: 3/17.  Per report, bleeding was severe, but he did not have endoscopy done.   4. PAD: 11/16 cath showed occluded bilateral SFAs.   - Lower extremity dopplers (5/17) with bilateral SFA occlusions, > 50% left external iliac stenosis.  - PV angiography in 8/17 with totally occluded bilateral SFAs, significant bilateral CIA disease => medical treatment.  5. VT: 4/17 admit with VT, loss of consciousness.  6. Hyperlipidemia 7. Aortic stenosis: Probably moderate on 4/17 echo, mild on 5/17 echo.  Concern for low gradient severe AS on 5/18 echo.  - Low dose DSE (6/18): AVA remained 0.5 cm^2 with dobutamine, mean gradient increased to 32 mmHg. Suspect low gradient severe AS.  8. Mitral regurgitation: Moderate to severe on 4/17 echo, suspect infarct-related.  Moderate on 5/17 echo.  Moderate functional MR on 5/18 echo.  9. AAA: Abdominal US 5/17 with 4.5 cm AAA.  Abdominal US (3/18) with 4 cm AAA.  10. Low back pain 11. Depression 12. Fe deficiency anemia.   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  Nonsmoker (since college years), no ETOH.   FH: CAD  Review of systems complete and found to be negative unless listed in HPI.    Current Outpatient Prescriptions  Medication Sig Dispense Refill  . amiodarone (PACERONE) 200 MG tablet Take 1 tablet (200 mg total) by mouth daily. 30 tablet 6  . aspirin EC 81 MG EC tablet Take 1 tablet (81 mg total) by mouth daily. 30 tablet 3  . clopidogrel (PLAVIX) 75 MG tablet Take 1 tablet (75 mg total) by mouth daily. 30 tablet 6  . digoxin (LANOXIN) 0.125 MG tablet Take 0.5 tablets (0.0625 mg total) by mouth every other day.    . ezetimibe  (ZETIA) 10 MG tablet take 1 tablet by mouth once daily 90 tablet 1  . ferrous sulfate 325 (65 FE) MG EC tablet Take 1 tablet (325 mg total) by mouth 3 (three) times daily with meals. (Patient taking differently: Take 325 mg by mouth 2 (two) times daily with a meal. ) 90 tablet 3  . folic acid (FOLVITE) 1 MG tablet take 1 tablet by mouth once daily 30 tablet 11  . furosemide (LASIX) 40 MG tablet Take 40 mg (1 tab) in am and 20 mg (1/2 tab) in pm 45 tablet 6  . Investigational - Study Medication Take 1 tablet by mouth 2 (two) times daily. Enrolled in [employer] Clinical Trial - Omecamtiv Mecarbil or Placebo    . isosorbide-hydrALAZINE (BIDIL) 20-37.5 MG tablet Take 0.5 tablets by mouth 3 (three) times daily. 45 tablet 3  . latanoprost (XALATAN) 0.005 % ophthalmic solution Place 1 drop into both eyes at bedtime.  0  . linaclotide (LINZESS) 145  MCG CAPS capsule Take 1 capsule (145 mcg total) by mouth daily as needed (for constipation). Reported on 01/01/2016 30 capsule 3  . PARoxetine (PAXIL) 10 MG tablet Take 1 tablet (10 mg total) by mouth daily. 30 tablet 6  . RANEXA 500 MG 12 hr tablet take 1 tablet by mouth twice a day 60 tablet 5  . rosuvastatin (CRESTOR) 40 MG tablet Take 1 tablet (40 mg total) by mouth daily. 90 tablet 3  . spironolactone (ALDACTONE) 25 MG tablet take 1/2 tablet by mouth every morning 15 tablet 6  . timolol (TIMOPTIC) 0.5 % ophthalmic solution Place 1 drop into both eyes 2 (two) times daily.  0  . traMADol (ULTRAM) 50 MG tablet 1/2 tab po every 12 hours as needed for pain 20 tablet 0  . traZODone (DESYREL) 50 MG tablet Take 1/2 or 1 at bedtime as needed for sleep 90 tablet 3  . vitamin C (ASCORBIC ACID) 500 MG tablet Take 1 tablet (500 mg total) by mouth 2 (two) times daily. 60 tablet 3  . nitroGLYCERIN (NITROSTAT) 0.4 MG SL tablet Place 0.4 mg under the tongue every 5 (five) minutes as needed for chest pain.    . promethazine (PHENERGAN) 12.5 MG tablet Take 1 tablet (12.5 mg  total) by mouth every 6 (six) hours as needed for nausea or vomiting. (Patient not taking: Reported on 04/01/2017) 30 tablet 0   No current facility-administered medications for this encounter.    BP 114/64   Pulse 79   Wt 130 lb (59 kg)   SpO2 99%   BMI 18.92 kg/m    Wt Readings from Last 3 Encounters:  04/01/17 130 lb (59 kg)  03/24/17 130 lb (59 kg)  03/24/17 130 lb (59 kg)   General: Elderly and chronically ill appearing. Thin. Daughter present.  HEENT: Normal Neck: Supple. JVP 8-9 cm. Carotids 2+ bilat; no bruits. No thyromegaly or nodule noted. Cor: PMI nondisplaced. RRR, 3/6 SEM RUSB with muffled S2.   Lungs: Clear, normal effort.  Abdomen: Soft, non-tender, non-distended, no HSM. No bruits or masses. +BS  Extremities: No cyanosis, clubbing, or rash. Trace ankle edema.   Neuro: Alert & orientedx3, cranial nerves grossly intact. moves all 4 extremities w/o difficulty. Affect flat.  Assessment/Plan: 1. CAD: s/p CABG 1987.  4/17 cath showed all SVGs occluded.  The LIMA-LAD had a 70-75% anastomotic lesion.  Readmitted with anterior STEMI in 5/17.  Repeat cath showed similar anatomy to 4/17 cath.  Had PCI 01/20/16 to LIMA-LAD anastomotic lesion.   - Denies chest pain. Continue meds as below.   - Continue ASA 81 and Plavix.  - Continue Crestor and Zetia, good lipids recently.   - He can continue Ranexa.  2. Chronic systolic CHF: Ischemic cardiomyopathy.  EF 25% with mild-moderate RV dysfunction on 5/18 echo. Has St Jude ICD.  He is volume overloaded on exam today, NYHA class IIIb, suspect a degree of low cardiac output.  Symptoms have worsened slowly over the last few months.  May be related to severe aortic stenosis.  - Off coreg due to profound fatigue with beta blocker, improved off beta blocker. Would leave off beta blocker, especially with soft BP and suspicion for low output.  - Digoxin decreased to every other day. Recheck level today with worsening creatinine.  - Continue  Bidil 1/2 tab tid and spironolactone 12.5 daily for now. No room to up-titrate with soft pressure and worsening renal function.  - Continue lasix 40 mg q am and  20 mg q pm for now.  - Continue [employer] study drug.  - He is very tenuous. ? Component of low output. Will discuss possible RHC with Dr. Shirlee Latch. Pt and family willing.  3. PAD: Ongoing significant claudication, no pedal ulcerations.  Peripheral angiography by Dr Kirke Corin in 8/17, report above.  Plan medical management => no percutaneous option and very high risk for aortobifemoral bypass. No change.  4. VT: Likely scar-mediated. Has a St Jude ICD.  - Continue amiodarone 200 mg daily.  Recent LFTs and TSH ok. Will need regular eye exams with amiodarone use. No change.  5. Anemia: Fe-deficiency.  GI bleeding of uncertain etiology in 3/17. Denies bleeding.  6. Aortic stenosis:  Suspect low gradient severe aortic stenosis.  This likely is a significant contributor to his symptomatic worsening.  He would be a poor candidate for traditional surgical AVR.  TAVR, however, is also going to be a difficult proposition.  He has severe PAD and concerned that femoral access may not be feasible.  He also has CKD stage 3 and is frail.  Trans-apical or trans-aortic approach would likely be technically feasible but higher risk.  - He saw Dr. Excell Seltzer who did not think TAVR was feasible.  He was willing to reassess in a couple of months to see if Mr Clairmont has strengthened any.  - He appears to have gotten weaker.  It is unclear if he will become strong enough to be a reasonable candidate, but the likelihood is low.   7. Mitral regurgitation: Moderate, probably functional.  8. AAA: 4.0 cm 10/2016. Repeat 1 year.    9. CKD: Stage III-IV.  Suspect cardiorenal component.  Needs BMET in 1 week after increasing Lasix.  10. Deconditioning - Continue PT.   11. Poor long-term prognosis. - Pt very tenuous and pt is not a candidate for advanced therapies with advanced age  and kidney disease.  This was brought up to pt and daughter in a conversation involving EOL decision making. Daughter shut down any further conversation, though patient seemed to be more open to this line of though.  Will continues to discuss with family. Suspect he would benefit from palliative care involvement or at least a conversation in the near future.    Labs today. Will adjust medications as needed based on labwork.  Will discuss ? RHC with Dr. Shirlee Latch and plan to follow up in 2 weeks, sooner with worsening symptoms.   Graciella Freer, PA-C  04/01/2017  Greater than 50% of the 25 minute visit was spent in counseling/coordination of care regarding disease state education, end of life decision making, salt/fluid restriction, and discussion of plan with MD.

## 2017-04-05 ENCOUNTER — Ambulatory Visit (INDEPENDENT_AMBULATORY_CARE_PROVIDER_SITE_OTHER): Payer: Medicare Other

## 2017-04-05 DIAGNOSIS — I5022 Chronic systolic (congestive) heart failure: Secondary | ICD-10-CM

## 2017-04-05 DIAGNOSIS — Z95810 Presence of automatic (implantable) cardiac defibrillator: Secondary | ICD-10-CM | POA: Diagnosis not present

## 2017-04-05 NOTE — Progress Notes (Signed)
EPIC Encounter for ICM Monitoring  Patient Name: Andre Wilson is a 81 y.o. male Date: 04/05/2017 Primary Care Physican: Darreld Mclean, MD Primary Cardiologist:McLean Electrophysiologist: Curt Bears Nephrologist: Lyn Henri Weight:unknown       Spoke with daughter [name].  She reported less swelling of the feet since Dr Aundra Dubin increased Lasix on 04/01/2017.  He has a return appointment tomorrow for labs and will see Oda Kilts, PA also.    Thoracic impedance has returned to baseline after 35 days of abnormal suggesting fluid accumulation.  Prescribed dosage: Furosemide 40 mg (1 tab) in AM and 20 mg (1/2 tab) in PM  Labs: 04/01/2017 Creatinine 3.16, BUN 40, Potassium 5.2, Sodium 138 03/23/2017 Creatinine 2.47, BUN 33, Potassium 4.6, Sodium 137 02/09/2017 Creatinine 1.94, BUN 35, Potassium 4.3, Sodium 135  12/23/2016 Creatinine 2.56, BUN 23, Potassium 4.0, Sodium 135 12/14/2016 Creatinine 2.42, BUN 27, Potassium 4.2, Sodium 133, EGFR 33.48 12/09/2016 Creatinine 2.35, BUN 27, Potassium 4.8, Sodium 134, EGFR 34.63 11/26/2016 Creatinine 2.19, BUN 28, Potassium 4.1, Sodium 138, EGFR 37.57 11/05/2016 Creatinine 2.03, BUN 31, Potassium 4.7, Sodium 138, EGFR 30-34 10/29/2016 Creatinine 2.34, BUN 42, Potassium 4.9, Sodium 137, EGFR 25-29  10/15/2016 Creatinine 2.07, BUN 32, Potassium 4.6, Sodium 138, EGFR 29-34  10/02/2016 Creatinine 1.89, BUN 29, Potassium 5.3, Sodium 137, EGFR 42  09/16/2016 Creatinine 2.01, BUN 27, Potassium 5.3, Sodium 138, EGFR 30-35 09/02/2016 Creatinine 2.02, BUN 27, Potassium 4.6, Sodium 138, EGFR 30-35 07/22/2016 Creatinine 1.92, BUN 24, Potassium 5.2, Sodium 139, EGFR 32-37 06/10/2016 Creatinine 1.54, BUN 22, Potassium 5.2, Sodium 137, EGFR 42-48  Recommendations: She reported patient's urine is brown and dark.  Asked how much fluid does he drink a day and she said about 32 oz.  She thought he was restricted to that amount because she read it on a HF  poster in the office.  Advised instructions at 8/10 visit with PA says he can have up to 2 liters a day.  Advised since urine is dark and brown that he should increase fluid intake to minimum of 50 ounces and no more than 64 oz.   He has BMET scheduled for tomorrow.       Follow-up plan: ICM clinic phone appointment on 05/06/2017.  Office appointment scheduled 04/16/2017 with HF clinic and 05/06/2017 with Dr Burt Knack.  Copy of ICM check sent to primary cardiologist and device physician.   3 month ICM trend: 04/05/2017   1 Year ICM trend:      Rosalene Billings, RN 04/05/2017 8:43 AM

## 2017-04-06 ENCOUNTER — Ambulatory Visit (HOSPITAL_COMMUNITY)
Admission: RE | Admit: 2017-04-06 | Discharge: 2017-04-06 | Disposition: A | Discharge status: Home / Self Care | Payer: Medicare Other | Source: Ambulatory Visit | Attending: Internal Medicine | Admitting: Internal Medicine

## 2017-04-06 DIAGNOSIS — I509 Heart failure, unspecified: Secondary | ICD-10-CM

## 2017-04-06 LAB — BASIC METABOLIC PANEL
ANION GAP: 9 (ref 5–15)
BUN: 31 mg/dL — AB (ref 6–20)
CALCIUM: 9.2 mg/dL (ref 8.9–10.3)
CO2: 26 mmol/L (ref 22–32)
Chloride: 103 mmol/L (ref 101–111)
Creatinine, Ser: 3.06 mg/dL — ABNORMAL HIGH (ref 0.61–1.24)
GFR calc Af Amer: 21 mL/min — ABNORMAL LOW (ref >60)
GFR, EST NON AFRICAN AMERICAN: 18 mL/min — AB (ref >60)
GLUCOSE: 131 mg/dL — AB (ref 65–99)
POTASSIUM: 4.7 mmol/L (ref 3.5–5.1)
SODIUM: 138 mmol/L (ref 135–145)

## 2017-04-07 ENCOUNTER — Encounter (HOSPITAL_COMMUNITY)
Admission: RE | Admit: 2017-04-07 | Discharge: 2017-04-07 | Disposition: A | Discharge status: Home / Self Care | Payer: Medicare Other | Source: Ambulatory Visit | Attending: Nephrology | Admitting: Nephrology

## 2017-04-07 DIAGNOSIS — D631 Anemia in chronic kidney disease: Secondary | ICD-10-CM | POA: Insufficient documentation

## 2017-04-07 DIAGNOSIS — N183 Chronic kidney disease, stage 3 unspecified: Secondary | ICD-10-CM

## 2017-04-07 LAB — IRON AND TIBC
IRON: 43 ug/dL — AB (ref 45–182)
Saturation Ratios: 14 % — ABNORMAL LOW (ref 17.9–39.5)
TIBC: 298 ug/dL (ref 250–450)
UIBC: 255 ug/dL

## 2017-04-07 LAB — POCT HEMOGLOBIN-HEMACUE: HEMOGLOBIN: 10.9 g/dL — AB (ref 13.0–17.0)

## 2017-04-07 LAB — FERRITIN: Ferritin: 628 ng/mL — ABNORMAL HIGH (ref 24–336)

## 2017-04-07 MED ORDER — EPOETIN ALFA 20000 UNIT/ML IJ SOLN
INTRAMUSCULAR | Status: AC
  Filled 2017-04-07: qty 1

## 2017-04-07 MED ORDER — EPOETIN ALFA 20000 UNIT/ML IJ SOLN
20000.0000 [IU] | INTRAMUSCULAR | Status: DC
  Administered 2017-04-07: 20000 [IU] via SUBCUTANEOUS

## 2017-04-11 ENCOUNTER — Telehealth: Payer: Self-pay | Admitting: Family Medicine

## 2017-04-11 DIAGNOSIS — F329 Major depressive disorder, single episode, unspecified: Secondary | ICD-10-CM

## 2017-04-11 DIAGNOSIS — R634 Abnormal weight loss: Secondary | ICD-10-CM

## 2017-04-11 MED ORDER — PAROXETINE HCL 20 MG PO TABS: 20.0000 mg | ORAL_TABLET | Freq: Every day | ORAL | 3 refills | Status: AC

## 2017-04-11 NOTE — Telephone Encounter (Signed)
Called and spoke with Toniann Fail, his daughter  Wt Readings from Last 3 Encounters:  04/01/17 130 lb (59 kg)  03/24/17 130 lb (59 kg)  03/24/17 130 lb (59 kg)   He is doing better with phenergan and this is helping him to eat a little better- he is able to tolerate food, but is just not that hungry They wonder if increasing his paxil might be helpful- we can try this as he has tolerated the 20 mg ok  I will send in an rx for paxil 20 and they will double up on the 10 mg that they have for now

## 2017-04-11 NOTE — Telephone Encounter (Signed)
Caller name: Sharyl Nimrod  Relation to pt: RN from [facility] Call back number: [phone]    Reason for call:  Wanted to inform PCP patient has been [place], vomitting for 4 days in need of clinical advice and requesting  verbal orders for nutrion and speech consult, please advise

## 2017-04-11 NOTE — Telephone Encounter (Signed)
Called [place] and gave her verbal order

## 2017-04-13 ENCOUNTER — Other Ambulatory Visit: Payer: Self-pay | Admitting: Cardiology

## 2017-04-15 ENCOUNTER — Telehealth: Payer: Self-pay | Admitting: Family Medicine

## 2017-04-15 ENCOUNTER — Telehealth: Payer: Self-pay

## 2017-04-15 ENCOUNTER — Encounter (HOSPITAL_COMMUNITY): Payer: Medicare Other

## 2017-04-23 NOTE — Telephone Encounter (Signed)
Physician orders received for ST eval; form placed in PCP red folder for completion.

## 2017-04-23 NOTE — Telephone Encounter (Signed)
Family member called in to make PCP aware that pt passed away this morning.

## 2017-04-23 DEATH — deceased

## 2017-05-05 ENCOUNTER — Encounter (HOSPITAL_COMMUNITY): Payer: Medicare Other

## 2017-05-06 ENCOUNTER — Ambulatory Visit: Payer: Medicare Other | Admitting: Cardiovascular Disease

## 2017-06-21 ENCOUNTER — Telehealth: Payer: Self-pay | Admitting: *Deleted

## 2017-06-21 NOTE — Telephone Encounter (Signed)
Received [facility] from [facility] for billing purposes; forwarded to provider/SLS 10/30

## 2017-08-12 ENCOUNTER — Encounter (HOSPITAL_COMMUNITY): Payer: Self-pay

## 2017-08-12 NOTE — Progress Notes (Signed)
Medical Record request received by [facility] for documentation of HTN with supporting OV note and vitals flowsheet. Faxed to provided # [phone] Notation made that patient is deceased.  Ave FilterBradley, Megan Genevea, RN

## 2017-09-09 IMAGING — DX DG LUMBAR SPINE 2-3V
3 series · 3 of 3 positions shown · non-contrast
Comparison: None.

CLINICAL DATA: Chronic low back pain, for years.

EXAM:
LUMBAR SPINE - 2-3 VIEW

[l-spine ap]
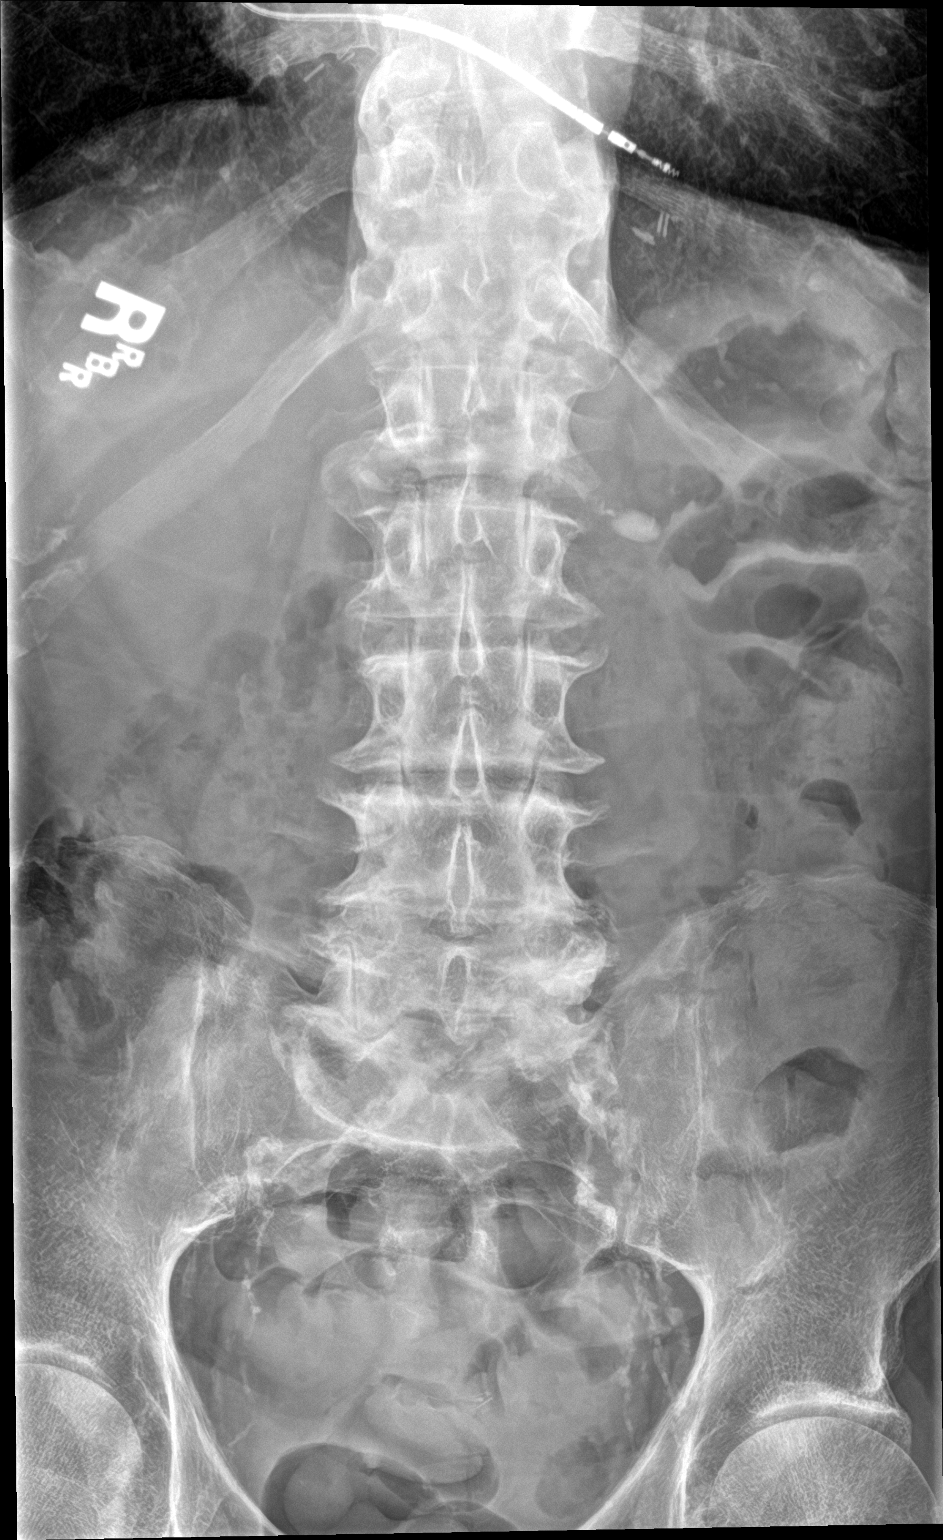

[l-spine lat]
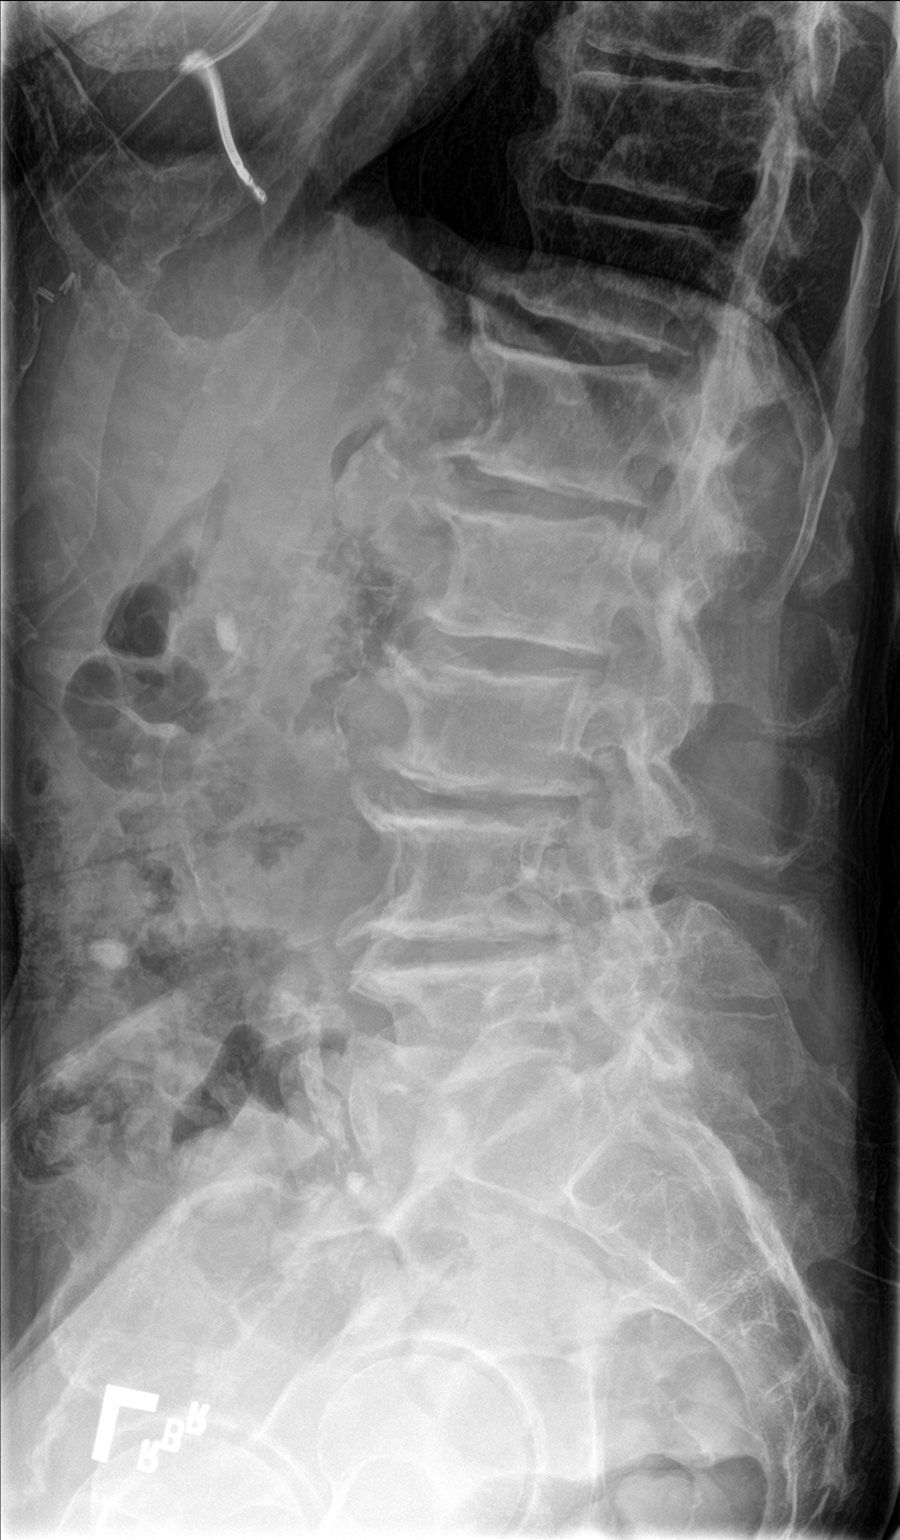

[l-spine spot]
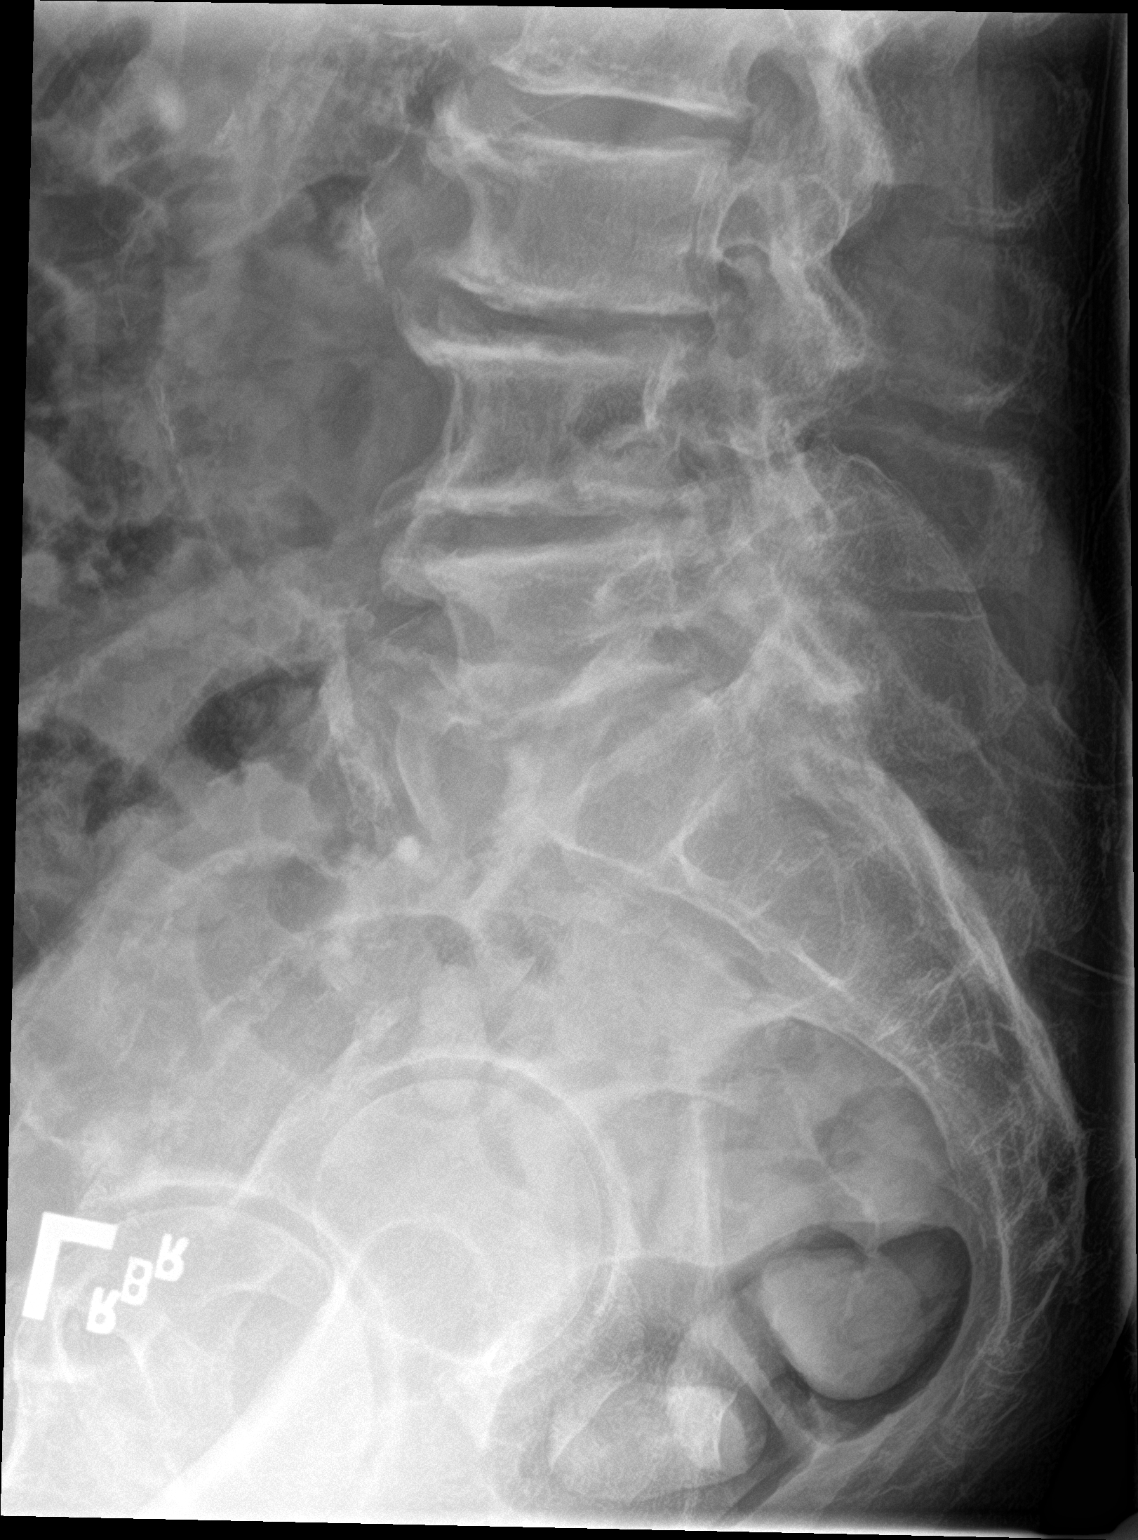

[3 of 3 positions shown; findings below may reference images not displayed]

FINDINGS: Diffuse spondylosis and disc narrowing. Facet arthropathy with bulky
spurring at L5-S1. Generalized osteopenia. No fracture deformity or
endplate erosion. No evidence of focal bone lesion.

Two ovoid densities in the lateral projection, favor mesenteric
calcifications or pills in the enteric stream. These do not overlap
the renal shadows on both projections. Aortic and iliac
atherosclerosis. Known abdominal aortic aneurysm.
IMPRESSION: 1. Diffuse spondylosis and disc narrowing. Facet arthropathy with
bulky spurring at L5-S1.
2. Osteopenia.
3. No acute finding.
4. Atherosclerosis and known abdominal aortic aneurysm.

## 2017-12-12 ENCOUNTER — Ambulatory Visit: Payer: Medicare Other | Admitting: *Deleted

## 2018-06-28 IMAGING — US US RENAL
1 series · 14 of 25 positions shown · non-contrast
Comparison: Aorta ultrasound 10/29/2016.

CLINICAL DATA: 80 y/o male with acute renal injury superimposed on
stage 3 chronic kidney disease.

EXAM:
RENAL / URINARY TRACT ULTRASOUND COMPLETE

[Series 1: us renal · 0.16mm/px · 14 of 37 slices shown]
[im 1/37]
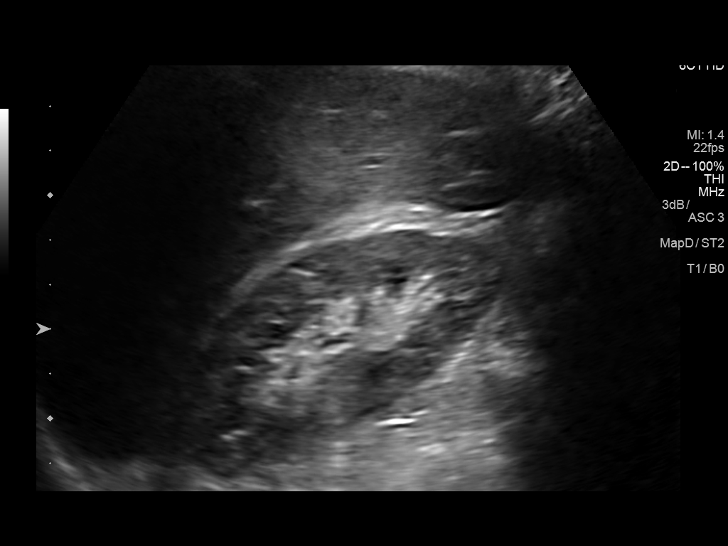
[im 4/37]
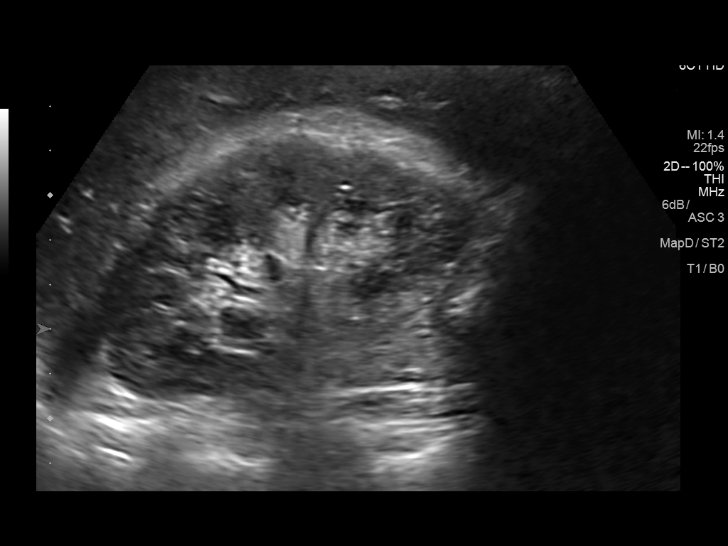
[im 7/37]
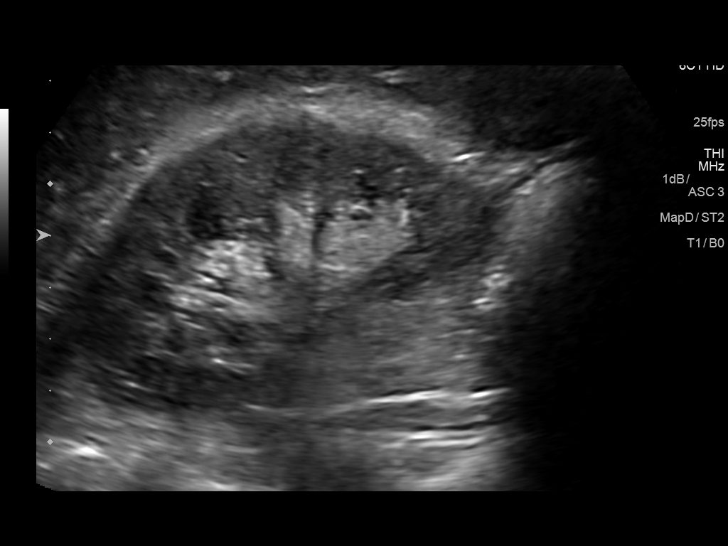
[im 10/37]
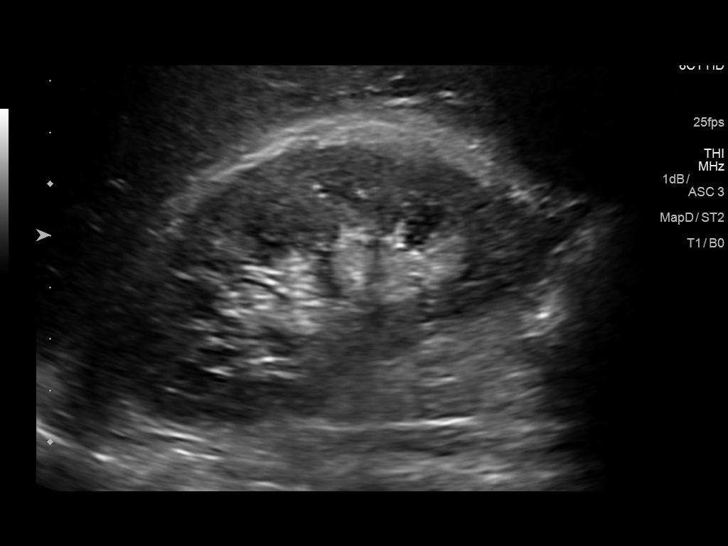
[im 13/37]
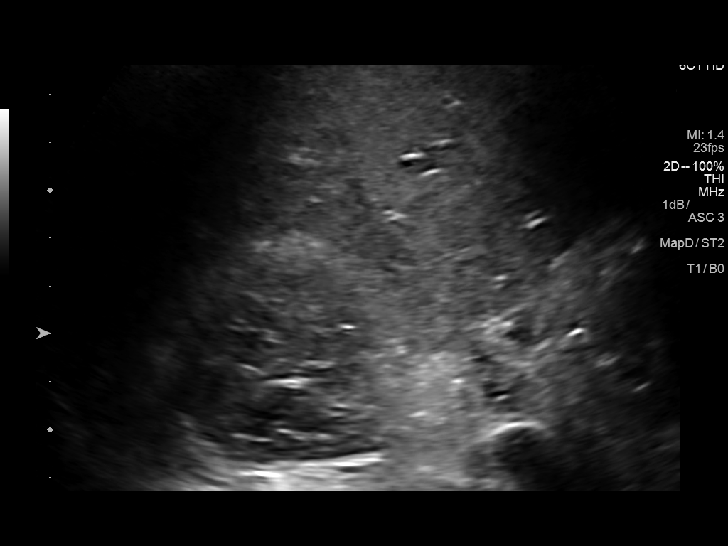
[im 14/37]
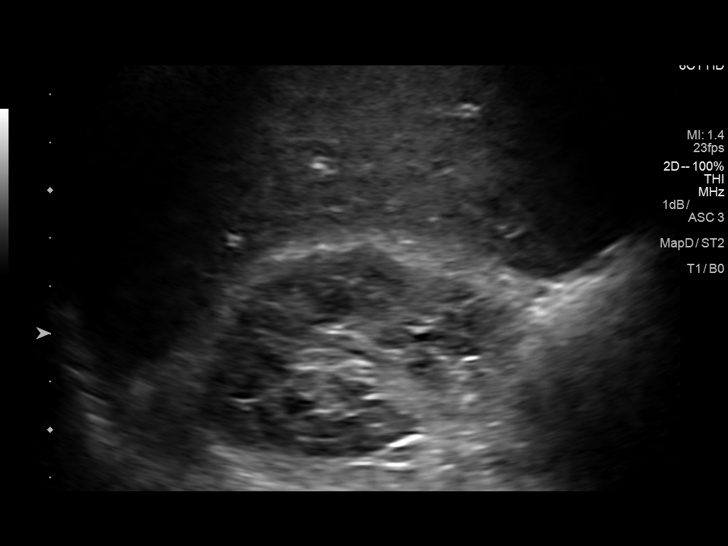
[im 17/37]
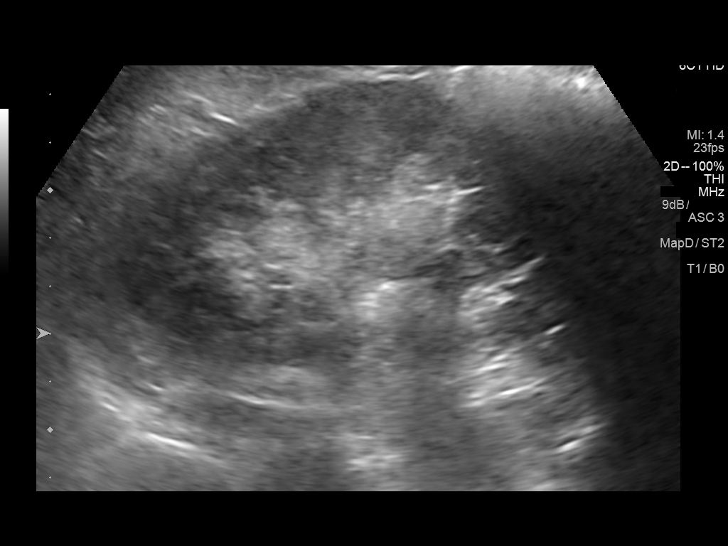
[im 20/37]
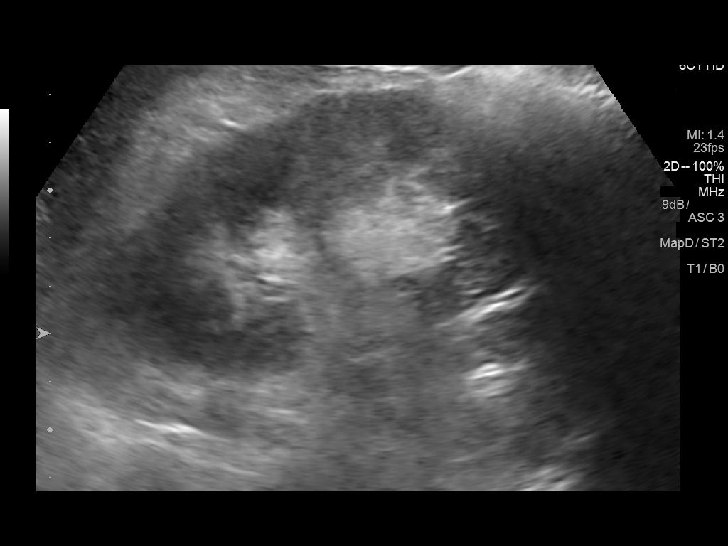
[im 23/37]
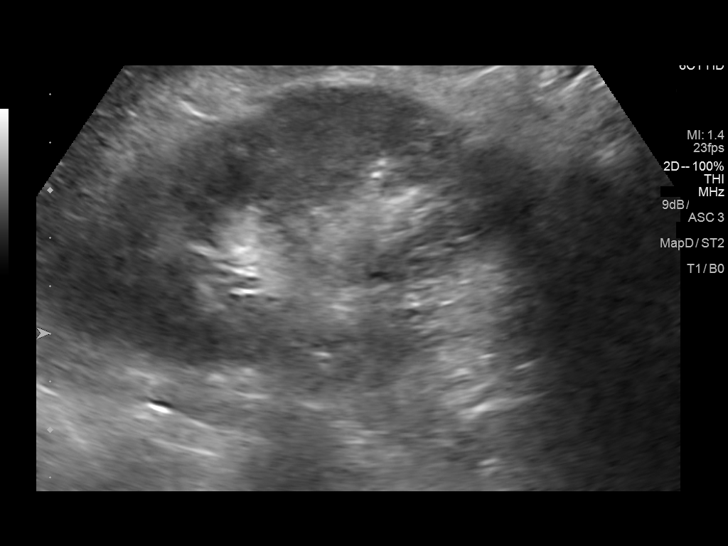
[im 25/37]
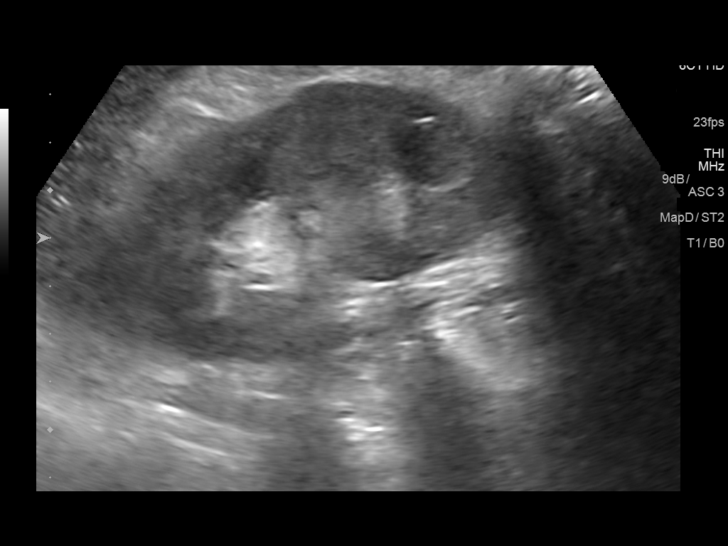
[im 28/37]
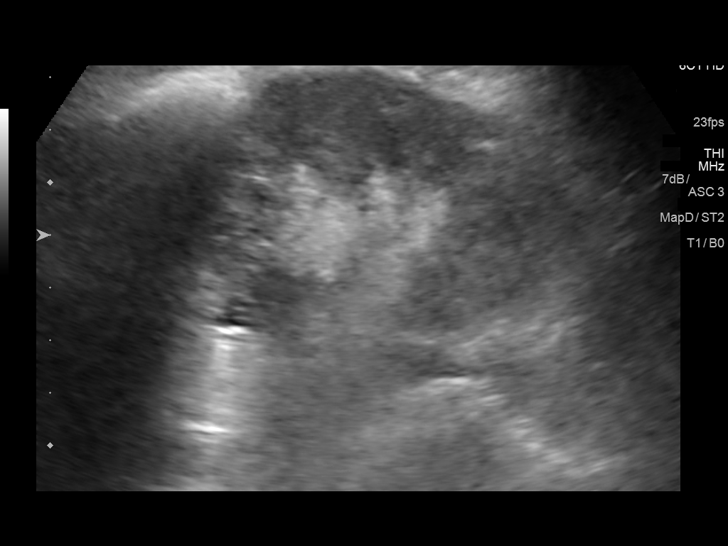
[im 31/37]
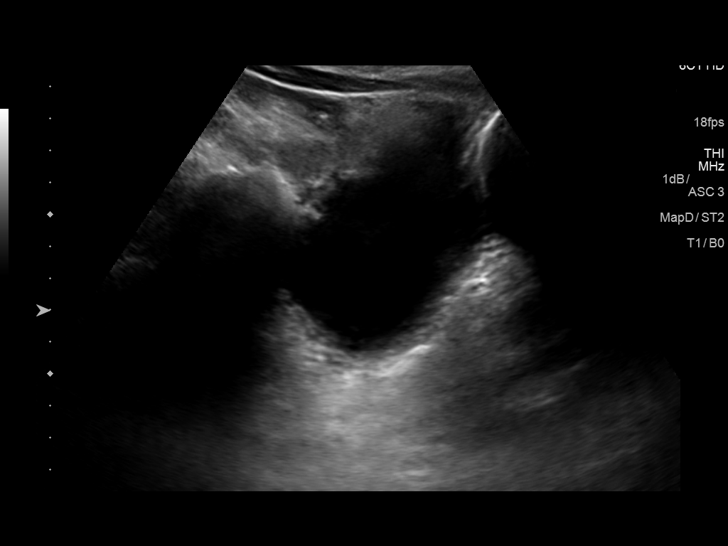
[im 34/37]
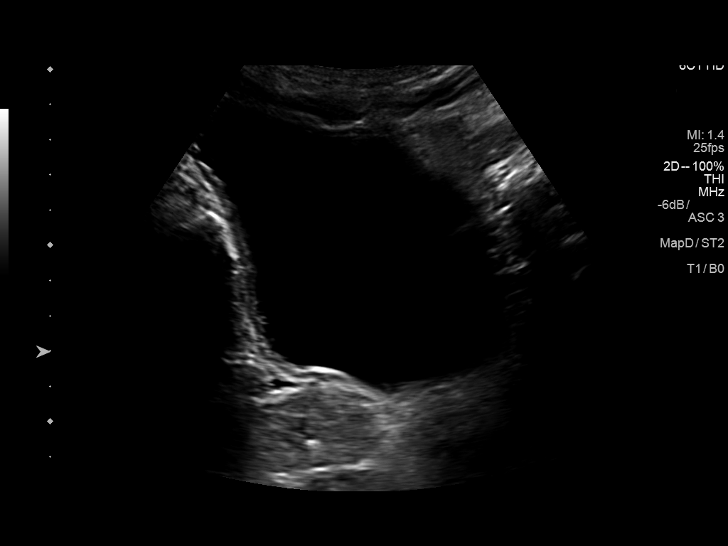
[im 37/37]
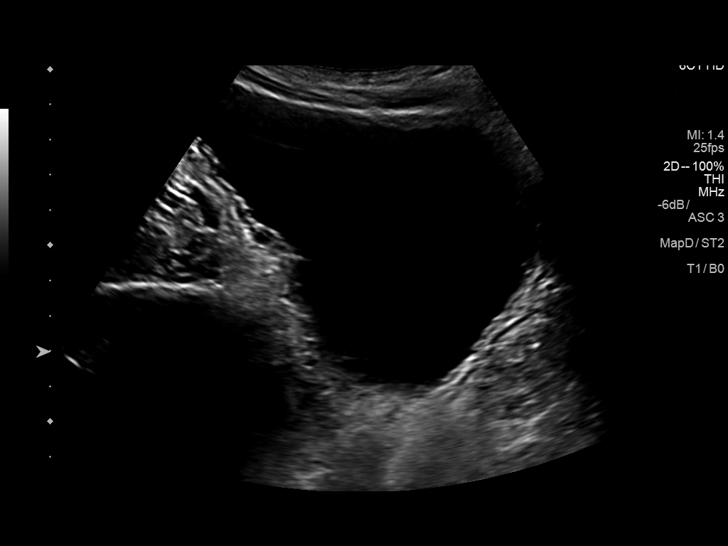

[14 of 25 positions shown; findings below may reference images not displayed]

FINDINGS: Right Kidney:

Length: 9.0 cm. Echogenicity within normal limits. No mass or
hydronephrosis visualized.

Left Kidney:

Length: 9.1 cm. Echogenicity within normal limits. No mass or
hydronephrosis visualized.

Bladder:

Contains mild echogenic debris (image 35) and is moderately
distended, but otherwise normal.
IMPRESSION: 1. Normal sonographic appearance of both kidneys.
2. Some echogenic debris visible within the urinary bladder.
Correlate with urinalysis.
